# Patient Record
Sex: Female | Born: 1975
Health system: Southern US, Community
[De-identification: ages and names within clinical notes are randomized; demographics above are authoritative.]

## PROBLEM LIST (undated history)

## (undated) DIAGNOSIS — K829 Disease of gallbladder, unspecified: Secondary | ICD-10-CM

## (undated) DIAGNOSIS — M722 Plantar fascial fibromatosis: Secondary | ICD-10-CM

## (undated) DIAGNOSIS — N63 Unspecified lump in unspecified breast: Secondary | ICD-10-CM

## (undated) DIAGNOSIS — Z808 Family history of malignant neoplasm of other organs or systems: Secondary | ICD-10-CM

## (undated) DIAGNOSIS — I10 Essential (primary) hypertension: Secondary | ICD-10-CM

## (undated) DIAGNOSIS — R519 Headache, unspecified: Secondary | ICD-10-CM

## (undated) DIAGNOSIS — M549 Dorsalgia, unspecified: Secondary | ICD-10-CM

## (undated) DIAGNOSIS — Z923 Personal history of irradiation: Secondary | ICD-10-CM

## (undated) DIAGNOSIS — M79673 Pain in unspecified foot: Secondary | ICD-10-CM

## (undated) DIAGNOSIS — M2550 Pain in unspecified joint: Secondary | ICD-10-CM

## (undated) DIAGNOSIS — Z8042 Family history of malignant neoplasm of prostate: Secondary | ICD-10-CM

## (undated) DIAGNOSIS — E669 Obesity, unspecified: Secondary | ICD-10-CM

## (undated) DIAGNOSIS — E559 Vitamin D deficiency, unspecified: Secondary | ICD-10-CM

## (undated) DIAGNOSIS — E538 Deficiency of other specified B group vitamins: Secondary | ICD-10-CM

## (undated) DIAGNOSIS — R51 Headache: Secondary | ICD-10-CM

## (undated) DIAGNOSIS — Z803 Family history of malignant neoplasm of breast: Secondary | ICD-10-CM

## (undated) DIAGNOSIS — D649 Anemia, unspecified: Secondary | ICD-10-CM

## (undated) HISTORY — DX: Dorsalgia, unspecified: M54.9

## (undated) HISTORY — DX: Family history of malignant neoplasm of prostate: Z80.42

## (undated) HISTORY — DX: Plantar fascial fibromatosis: M72.2

## (undated) HISTORY — PX: PILONIDAL CYST EXCISION: SHX744

## (undated) HISTORY — DX: Personal history of irradiation: Z92.3

## (undated) HISTORY — DX: Deficiency of other specified B group vitamins: E53.8

## (undated) HISTORY — DX: Obesity, unspecified: E66.9

## (undated) HISTORY — PX: WISDOM TOOTH EXTRACTION: SHX21

## (undated) HISTORY — DX: Pain in unspecified foot: M79.673

## (undated) HISTORY — DX: Disease of gallbladder, unspecified: K82.9

## (undated) HISTORY — DX: Vitamin D deficiency, unspecified: E55.9

## (undated) HISTORY — DX: Family history of malignant neoplasm of other organs or systems: Z80.8

## (undated) HISTORY — DX: Pain in unspecified joint: M25.50

## (undated) HISTORY — DX: Family history of malignant neoplasm of breast: Z80.3

---

## 1999-07-21 HISTORY — PX: OTHER SURGICAL HISTORY: SHX169

## 1999-11-21 ENCOUNTER — Encounter: Payer: Self-pay | Admitting: Family Medicine

## 1999-11-21 ENCOUNTER — Ambulatory Visit (HOSPITAL_COMMUNITY): Admission: RE | Admit: 1999-11-21 | Discharge: 1999-11-21 | Payer: Self-pay | Admitting: Family Medicine

## 2001-07-07 ENCOUNTER — Encounter: Payer: Self-pay | Admitting: Emergency Medicine

## 2001-07-07 ENCOUNTER — Emergency Department (HOSPITAL_COMMUNITY): Admission: EM | Admit: 2001-07-07 | Discharge: 2001-07-07 | Payer: Self-pay | Admitting: Emergency Medicine

## 2001-07-28 ENCOUNTER — Ambulatory Visit (HOSPITAL_COMMUNITY): Admission: RE | Admit: 2001-07-28 | Discharge: 2001-07-28 | Payer: Self-pay | Admitting: Family Medicine

## 2001-07-28 ENCOUNTER — Encounter: Payer: Self-pay | Admitting: Family Medicine

## 2001-09-22 ENCOUNTER — Encounter: Payer: Self-pay | Admitting: General Surgery

## 2001-09-22 ENCOUNTER — Observation Stay (HOSPITAL_COMMUNITY): Admission: RE | Admit: 2001-09-22 | Discharge: 2001-09-23 | Payer: Self-pay | Admitting: General Surgery

## 2001-09-22 ENCOUNTER — Encounter (INDEPENDENT_AMBULATORY_CARE_PROVIDER_SITE_OTHER): Payer: Self-pay | Admitting: Specialist

## 2002-05-05 ENCOUNTER — Ambulatory Visit (HOSPITAL_COMMUNITY): Admission: RE | Admit: 2002-05-05 | Discharge: 2002-05-05 | Payer: Self-pay | Admitting: Family Medicine

## 2002-05-05 ENCOUNTER — Encounter: Payer: Self-pay | Admitting: Family Medicine

## 2002-10-26 ENCOUNTER — Encounter: Payer: Self-pay | Admitting: Orthopaedic Surgery

## 2002-10-26 ENCOUNTER — Encounter: Admission: RE | Admit: 2002-10-26 | Discharge: 2002-10-26 | Payer: Self-pay | Admitting: Orthopaedic Surgery

## 2003-03-28 ENCOUNTER — Encounter: Admission: RE | Admit: 2003-03-28 | Discharge: 2003-03-28 | Payer: Self-pay | Admitting: Neurosurgery

## 2003-03-28 ENCOUNTER — Encounter: Payer: Self-pay | Admitting: Neurosurgery

## 2003-08-03 ENCOUNTER — Other Ambulatory Visit: Admission: RE | Admit: 2003-08-03 | Discharge: 2003-08-03 | Payer: Self-pay | Admitting: Family Medicine

## 2003-08-15 ENCOUNTER — Encounter: Admission: RE | Admit: 2003-08-15 | Discharge: 2003-08-15 | Payer: Self-pay | Admitting: Neurosurgery

## 2003-08-29 ENCOUNTER — Encounter: Admission: RE | Admit: 2003-08-29 | Discharge: 2003-08-29 | Payer: Self-pay | Admitting: Neurosurgery

## 2004-02-06 ENCOUNTER — Other Ambulatory Visit: Admission: RE | Admit: 2004-02-06 | Discharge: 2004-02-06 | Payer: Self-pay | Admitting: Family Medicine

## 2004-04-08 ENCOUNTER — Encounter: Admission: RE | Admit: 2004-04-08 | Discharge: 2004-04-08 | Payer: Self-pay | Admitting: Neurosurgery

## 2004-04-29 ENCOUNTER — Encounter: Admission: RE | Admit: 2004-04-29 | Discharge: 2004-04-29 | Payer: Self-pay | Admitting: Neurosurgery

## 2004-05-13 ENCOUNTER — Encounter: Admission: RE | Admit: 2004-05-13 | Discharge: 2004-05-13 | Payer: Self-pay | Admitting: Neurosurgery

## 2005-02-03 ENCOUNTER — Other Ambulatory Visit: Admission: RE | Admit: 2005-02-03 | Discharge: 2005-02-03 | Payer: Self-pay | Admitting: Family Medicine

## 2006-03-29 ENCOUNTER — Other Ambulatory Visit: Admission: RE | Admit: 2006-03-29 | Discharge: 2006-03-29 | Payer: Self-pay | Admitting: Family Medicine

## 2007-05-06 ENCOUNTER — Other Ambulatory Visit: Admission: RE | Admit: 2007-05-06 | Discharge: 2007-05-06 | Payer: Self-pay | Admitting: Family Medicine

## 2008-07-24 ENCOUNTER — Other Ambulatory Visit: Admission: RE | Admit: 2008-07-24 | Discharge: 2008-07-24 | Payer: Self-pay | Admitting: Family Medicine

## 2009-08-27 ENCOUNTER — Other Ambulatory Visit: Admission: RE | Admit: 2009-08-27 | Discharge: 2009-08-27 | Payer: Self-pay | Admitting: Family Medicine

## 2010-12-05 NOTE — Op Note (Signed)
[facility]  Patient:    Rebecca Dominguez, Rebecca Dominguez Visit Number: [id number] MRN: [id number]          Service Type: SUR Location: 3W 0340 01 Attending Physician:  Arlis Porta Dictated by:   Adolph Pollack, M.D. Proc. Date: 09/22/01 Admit Date:  09/22/2001   CC:         Rebecca Acres. Cliffton Dominguez, M.D.   Operative Report  PREOPERATIVE DIAGNOSIS:  Symptomatic cholelithiasis.  POSTOPERATIVE DIAGNOSIS:  Symptomatic cholelithiasis.  PROCEDURE:  Laparoscopic cholecystectomy with intraoperative cholangiogram.  SURGEON:  Adolph Pollack, M.D.  ASSISTANT:  Abigail Miyamoto, M.D.  ANESTHESIA:  General.  INDICATIONS FOR PROCEDURE:  Ms. Rebecca Dominguez is a 35 year old female status post laparoscopic Roux-en-Y gastric bypass at [facility]. She has lost 140 pounds over approximately a years time. She had post prandial right upper quadrant pain very classic for biliary colic. She ended up going to the emergency department after recurrent bouts and ultrasound demonstrated a cholelithiasis. Her symptoms were typical of biliary colic and now she presents for elective laparoscopic cholecystectomy. Of note was that on our ultrasound report, the common bile duct was slightly dilated. Her liver function tests preoperatively are normal.  TECHNIQUE:  She was placed supine on the operating table and a general anesthetic was administered. Her abdomen was sterilely prepped and draped. A 0.25% plain Marcaine was infiltrated in the subumbilical region and a small subumbilical scar was made incising the skin and subcutaneous tissue. The midline fascia was identified and a 1 to 1.5 cm incision was made in the midline fascia. A pursestring suture of #0 Vicryl was placed around the fascial edges. A Hasson trocar was introduced into the peritoneal cavity and a pneumoperitoneum created by insufflation of CO2 gas. Next, she was placed in the appropriate position  and a laparoscope was introduced into the abdominal cavity. The viscera underlying the Hasson trocar were examined and no injury noted.  An 11 mm incision was made in the epigastrium and two 5 mm incisions made in the right abdomen. Trocars similar in size to the incisions were then placed into the abdominal cavity. The fundus of the gallbladder was grasped and filmy adhesions between the gallbladder omentum, and duodenum were taken down bluntly. The fundus was then retracted to the right shoulder and the infundibulum was retracted to the right lateral region. Using careful blunt dissection, I was able to isolate the cystic duct. I completely mobilized the infundibulum and created a window around the cystic duct. I placed a clip at the cystic duct gallbladder junction and made an incision into the cystic duct. A small amount of bile was able to be milked out. I passed a cholangiocatheter into the anterior abdominal wall and a cholangiogram was performed.  Under real-time fluoroscopy, dilute contrast material was injected into the cystic duct. I promptly entered the common bile duct which drained promptly into the duodenum. I did not see any evidence of common bile duct obstruction. The right and left hepatic ducts were also visualized.  The final report of the cholangiogram is pending the radiolgists interpretation. The cholangiocath was removed, cystic duct was clipped three times proximally and divided. The cystic artery was identified, clipped and divided. The gallbladder was then dissected free from the liver bed intact with electrocautery. The gallbladder fossa was inspected and irrigated and inspected again and no bleeding and no bile leakage was noted. The gallbladder was then removed through the subumbilical incision. Under direct visualization, the subumbilical fascial defect was closed  by tightening up and tying down the pursestring suture. The irrigation fluid was  evacuated, pneumoperitoneum released and the trocars were removed.  All skin incisions were then closed with 4-0 monocryl subcuticular stitches. Steri-Strips and sterile dressings were applied.  The patient tolerated the procedure well without any apparent complications and was taken to the recovery room in satisfactory condition. Dictated by:   Adolph Pollack, M.D. Attending Physician:  Arlis Porta DD:  09/22/01 TD:  09/22/01 Job: [id number]

## 2011-07-21 HISTORY — PX: CHOLECYSTECTOMY: SHX55

## 2014-12-14 ENCOUNTER — Other Ambulatory Visit (HOSPITAL_COMMUNITY)
Admission: RE | Admit: 2014-12-14 | Discharge: 2014-12-14 | Disposition: A | Discharge status: Home / Self Care | Payer: BLUE CROSS/BLUE SHIELD | Source: Ambulatory Visit | Attending: Family Medicine | Admitting: Family Medicine

## 2014-12-14 DIAGNOSIS — Z1151 Encounter for screening for human papillomavirus (HPV): Secondary | ICD-10-CM | POA: Diagnosis present

## 2014-12-14 DIAGNOSIS — Z01419 Encounter for gynecological examination (general) (routine) without abnormal findings: Secondary | ICD-10-CM | POA: Insufficient documentation

## 2016-03-02 ENCOUNTER — Other Ambulatory Visit: Payer: Self-pay | Admitting: Gastroenterology

## 2016-03-05 ENCOUNTER — Encounter (HOSPITAL_COMMUNITY): Payer: Self-pay | Admitting: *Deleted

## 2016-03-05 NOTE — Progress Notes (Signed)
Spoke with pt for pre-procedure call. Pt denies cardiac history, chest pain or sob.

## 2016-03-06 ENCOUNTER — Encounter (HOSPITAL_COMMUNITY): Payer: Self-pay | Admitting: *Deleted

## 2016-03-06 ENCOUNTER — Encounter (HOSPITAL_COMMUNITY)
Admission: RE | Disposition: A | Discharge status: Home / Self Care | Payer: Self-pay | Source: Ambulatory Visit | Attending: Gastroenterology

## 2016-03-06 ENCOUNTER — Ambulatory Visit (HOSPITAL_COMMUNITY): Payer: Managed Care, Other (non HMO) | Admitting: Anesthesiology

## 2016-03-06 ENCOUNTER — Ambulatory Visit (HOSPITAL_COMMUNITY)
Admission: RE | Admit: 2016-03-06 | Discharge: 2016-03-06 | Disposition: A | Discharge status: Home / Self Care | Payer: Managed Care, Other (non HMO) | Source: Ambulatory Visit | Attending: Gastroenterology | Admitting: Gastroenterology

## 2016-03-06 DIAGNOSIS — I10 Essential (primary) hypertension: Secondary | ICD-10-CM | POA: Insufficient documentation

## 2016-03-06 DIAGNOSIS — Z9884 Bariatric surgery status: Secondary | ICD-10-CM | POA: Insufficient documentation

## 2016-03-06 DIAGNOSIS — Z1211 Encounter for screening for malignant neoplasm of colon: Secondary | ICD-10-CM | POA: Diagnosis present

## 2016-03-06 DIAGNOSIS — Z800 Family history of malignant neoplasm of digestive organs: Secondary | ICD-10-CM | POA: Diagnosis not present

## 2016-03-06 HISTORY — PX: COLONOSCOPY WITH PROPOFOL: SHX5780

## 2016-03-06 HISTORY — DX: Headache: R51

## 2016-03-06 HISTORY — DX: Essential (primary) hypertension: I10

## 2016-03-06 HISTORY — DX: Headache, unspecified: R51.9

## 2016-03-06 HISTORY — DX: Anemia, unspecified: D64.9

## 2016-03-06 SURGERY — COLONOSCOPY WITH PROPOFOL
Anesthesia: Monitor Anesthesia Care

## 2016-03-06 MED ORDER — PROPOFOL 500 MG/50ML IV EMUL
INTRAVENOUS | Status: DC | PRN
  Administered 2016-03-06: 75 ug/kg/min via INTRAVENOUS

## 2016-03-06 MED ORDER — PROPOFOL 10 MG/ML IV BOLUS
INTRAVENOUS | Status: DC | PRN
  Administered 2016-03-06 (×3): 20 mg via INTRAVENOUS
  Administered 2016-03-06: 30 mg via INTRAVENOUS
  Administered 2016-03-06 (×2): 20 mg via INTRAVENOUS
  Administered 2016-03-06: 10 mg via INTRAVENOUS

## 2016-03-06 MED ORDER — SODIUM CHLORIDE 0.9 % IV SOLN: INTRAVENOUS | Status: DC

## 2016-03-06 MED ORDER — LACTATED RINGERS IV SOLN
INTRAVENOUS | Status: DC
  Administered 2016-03-06: 1000 mL via INTRAVENOUS

## 2016-03-06 NOTE — Transfer of Care (Signed)
Immediate Anesthesia Transfer of Care Note  Patient: Rebecca Dominguez  Procedure(s) Performed: Procedure(s): COLONOSCOPY WITH PROPOFOL (N/A)  Patient Location: Endoscopy Unit  Anesthesia Type:MAC  Level of Consciousness: awake, alert  and oriented  Airway & Oxygen Therapy: Patient Spontanous Breathing  Post-op Assessment: Report given to RN, Post -op Vital signs reviewed and stable and Patient moving all extremities  Post vital signs: Reviewed and stable  Last Vitals:  Vitals:   03/06/16 1049 03/06/16 1052  BP:  132/68  Resp: (!) 9   Temp: 37.1 C     Last Pain:  Vitals:   03/06/16 1049  TempSrc: Oral         Complications: No apparent anesthesia complications

## 2016-03-06 NOTE — H&P (Signed)
[facility] H/P Note  Chief Complaint: colon cancer screening  HPI: Rebecca Dominguez is an 40 y.o. female.  Presenting for screening colonoscopy.  FHx colon cancer (father).  She has no abdominal pain, change in bowel habits, blood in stool, loss-of-appetite, unintentional weight loss.  NO prior colonoscopy.  Past Medical History:  Diagnosis Date  . Anemia    low iron  . Headache    occasional  . Hypertension    no longer on medications since weight loss    Past Surgical History:  Procedure Laterality Date  . CHOLECYSTECTOMY  2013  . gastric bypass surgery  2001  . PILONIDAL CYST EXCISION    . WISDOM TOOTH EXTRACTION      Medications Prior to Admission  Medication Sig Dispense Refill  . cholecalciferol (VITAMIN D) 1000 units tablet Take 1,000 Units by mouth daily.    . cyanocobalamin 500 MCG tablet Take 500 mcg by mouth daily.    . ferrous sulfate 325 (65 FE) MG tablet Take 325 mg by mouth daily with breakfast.    . levonorgestrel (MIRENA) 20 MCG/24HR IUD 1 each by Intrauterine route once.      Allergies: No Known Allergies  Family History  Problem Relation Age of Onset  . Breast cancer Mother   . Colon cancer Father   . Diabetes Father     Social History:  reports that she has never smoked. She has never used smokeless tobacco. She reports that she drinks alcohol. She reports that she does not use drugs.   ROS: As per HPI   Blood pressure 132/68, temperature 98.7 F (37.1 C), temperature source Oral, resp. rate (!) 9, height 5' 5.5" (1.664 m), weight (!) 154.2 kg (340 lb), SpO2 100 %. Gen:  NAD, overweight, obese ABD:  Soft, protuberant  No results found for this or any previous visit (from the past 48 hour(s)). No results found.  Assessment/Plan  1.  Colon cancer screening, high risk (first-degree relative with CRC). 2.  Plan for colonoscopy. 3.  Risks (bleeding, infection, bowel perforation that could require surgery, sedation-related changes  in cardiopulmonary systems), benefits (identification and possible treatment of source of symptoms, exclusion of certain causes of symptoms), and alternatives (watchful waiting, radiographic imaging studies, empiric medical treatment) of colonoscopy were explained to patient/family in detail and patient wishes to proceed.  Freddy JakschOUTLAW,Ethelbert Thain M 03/06/2016, 11:35 AM

## 2016-03-06 NOTE — Op Note (Signed)
[facility] Patient Name: Rebecca Dominguez Procedure Date : 03/06/2016 MRN: [id number] Attending MD: Willis ModenaWilliam Emogene Muratalla , MD Date of Birth: [birth year] CSN: [id number] Age: 40 Admit Type: Outpatient Procedure:                Colonoscopy Indications:              Screening in patient at increased risk: Family                            history of 1st-degree relative with colorectal                            cancer, This is the patient's first colonoscopy Providers:                Willis ModenaWilliam Reema Chick, MD, Tomma RakersJennifer Kappus, RN, Kandice RobinsonsGuillaume                            [place], Technician Referring MD:             Laurann Montanaynthia White, MD Medicines:                Monitored Anesthesia Care Complications:            No immediate complications. Estimated Blood Loss:     Estimated blood loss: none. Procedure:                Pre-Anesthesia Assessment:                           - Prior to the procedure, a History and Physical                            was performed, and patient medications and                            allergies were reviewed. The patient's tolerance of                            previous anesthesia was also reviewed. The risks                            and benefits of the procedure and the sedation                            options and risks were discussed with the patient.                            All questions were answered, and informed consent                            was obtained. Prior Anticoagulants: The patient has                            taken previous NSAID medication. ASA Grade  Assessment: II - A patient with mild systemic                            disease. After reviewing the risks and benefits,                            the patient was deemed in satisfactory condition to                            undergo the procedure.                           After obtaining informed consent, the colonoscope                            was passed  under direct vision. Throughout the                            procedure, the patient's blood pressure, pulse, and                            oxygen saturations were monitored continuously. The                            EC-[id number] ([id number] scope was introduced through                            the anus and advanced to the the cecum, identified                            by appendiceal orifice and ileocecal valve. The                            ileocecal valve, appendiceal orifice, and rectum                            were photographed. The entire colon was examined.                            The colonoscopy was performed without difficulty.                            The patient tolerated the procedure well. The                            quality of the bowel preparation was good. Scope In: 11:48:14 AM Scope Out: 12:02:02 PM Total Procedure Duration: 0 hours 13 minutes 48 seconds  Findings:      The perianal and digital rectal examinations were normal.      The entire examined colon appeared normal on direct and retroflexion       views.      The retroflexed view of the distal rectum and anal verge was normal and       showed no anal or rectal abnormalities. Impression:               -  The entire examined colon is normal on direct and                            retroflexion views.                           - The distal rectum and anal verge are normal on                            retroflexion view. Moderate Sedation:      None Recommendation:           - Discharge patient to home (via wheelchair).                           - Resume previous diet today.                           - Continue present medications.                           - Repeat colonoscopy in 5 years for screening                            purposes.                           - Return to GI clinic PRN.                           - Return to referring physician as previously                             scheduled. Procedure Code(s):        --- Professional ---                           [phone], Colonoscopy, flexible; diagnostic, including                            collection of specimen(s) by brushing or washing,                            when performed (separate procedure) Diagnosis Code(s):        --- Professional ---                           Z80.0, Family history of malignant neoplasm of                            digestive organs CPT copyright 2016 [employer]. All rights reserved. The codes documented in this report are preliminary and upon coder review may  be revised to meet current compliance requirements. Willis Modena, MD 03/06/2016 12:14:42 PM This report has been signed electronically. Number of Addenda: 0

## 2016-03-06 NOTE — Discharge Instructions (Signed)

## 2016-03-06 NOTE — Anesthesia Preprocedure Evaluation (Signed)
Anesthesia Evaluation  Patient identified by MRN, date of birth, ID band Patient awake    Reviewed: Allergy & Precautions, H&P , NPO status , Patient's Chart, lab work & pertinent test results  History of Anesthesia Complications Negative for: history of anesthetic complications  Airway Mallampati: II  TM Distance: >3 FB Neck ROM: full    Dental no notable dental hx.    Pulmonary neg pulmonary ROS,    Pulmonary exam normal breath sounds clear to auscultation       Cardiovascular hypertension, Normal cardiovascular exam Rhythm:regular Rate:Normal     Neuro/Psych  Headaches,    GI/Hepatic negative GI ROS, Neg liver ROS,   Endo/Other  negative endocrine ROS  Renal/GU negative Renal ROS     Musculoskeletal   Abdominal   Peds  Hematology  (+) anemia ,   Anesthesia Other Findings   Reproductive/Obstetrics negative OB ROS                             Anesthesia Physical Anesthesia Plan  ASA: II  Anesthesia Plan: MAC   Post-op Pain Management:    Induction: Intravenous  Airway Management Planned: Simple Face Mask  Additional Equipment:   Intra-op Plan:   Post-operative Plan:   Informed Consent: I have reviewed the patients History and Physical, chart, labs and discussed the procedure including the risks, benefits and alternatives for the proposed anesthesia with the patient or authorized representative who has indicated his/her understanding and acceptance.   Dental Advisory Given  Plan Discussed with: Anesthesiologist, CRNA and Surgeon  Anesthesia Plan Comments:         Anesthesia Quick Evaluation

## 2016-03-06 NOTE — Anesthesia Postprocedure Evaluation (Signed)
Anesthesia Post Note  Patient: Rebecca Dominguez  Procedure(s) Performed: Procedure(s) (LRB): COLONOSCOPY WITH PROPOFOL (N/A)  Patient location during evaluation: PACU Anesthesia Type: MAC Level of consciousness: awake and alert Pain management: pain level controlled Vital Signs Assessment: post-procedure vital signs reviewed and stable Respiratory status: spontaneous breathing, nonlabored ventilation, respiratory function stable and patient connected to nasal cannula oxygen Cardiovascular status: stable and blood pressure returned to baseline Anesthetic complications: no    Last Vitals:  Vitals:   03/06/16 1220 03/06/16 1230  BP: 135/90 (!) 143/96  Pulse: 74 77  Resp: (!) 21 20  Temp:      Last Pain:  Vitals:   03/06/16 1049  TempSrc: Oral                 Reino KentJudd, Starkisha Tullis J

## 2016-03-08 ENCOUNTER — Encounter (HOSPITAL_COMMUNITY): Payer: Self-pay | Admitting: Gastroenterology

## 2018-05-26 ENCOUNTER — Other Ambulatory Visit: Payer: Self-pay | Admitting: Family Medicine

## 2018-05-26 DIAGNOSIS — Z1231 Encounter for screening mammogram for malignant neoplasm of breast: Secondary | ICD-10-CM

## 2018-07-07 ENCOUNTER — Ambulatory Visit
Admission: RE | Admit: 2018-07-07 | Discharge: 2018-07-07 | Disposition: A | Discharge status: Home / Self Care | Payer: BLUE CROSS/BLUE SHIELD | Source: Ambulatory Visit | Attending: Family Medicine | Admitting: Family Medicine

## 2018-07-07 DIAGNOSIS — Z1231 Encounter for screening mammogram for malignant neoplasm of breast: Secondary | ICD-10-CM

## 2018-09-08 ENCOUNTER — Encounter (INDEPENDENT_AMBULATORY_CARE_PROVIDER_SITE_OTHER): Payer: Self-pay

## 2018-09-12 ENCOUNTER — Encounter (INDEPENDENT_AMBULATORY_CARE_PROVIDER_SITE_OTHER): Payer: Self-pay | Admitting: Family Medicine

## 2018-09-12 ENCOUNTER — Ambulatory Visit (INDEPENDENT_AMBULATORY_CARE_PROVIDER_SITE_OTHER): Payer: BLUE CROSS/BLUE SHIELD | Admitting: Family Medicine

## 2018-09-12 VITALS — BP 159/75 | HR 92 | Ht 65.0 in | Wt 338.0 lb

## 2018-09-12 DIAGNOSIS — R5383 Other fatigue: Secondary | ICD-10-CM | POA: Diagnosis not present

## 2018-09-12 DIAGNOSIS — F3289 Other specified depressive episodes: Secondary | ICD-10-CM | POA: Diagnosis not present

## 2018-09-12 DIAGNOSIS — Z9189 Other specified personal risk factors, not elsewhere classified: Secondary | ICD-10-CM | POA: Diagnosis not present

## 2018-09-12 DIAGNOSIS — R0602 Shortness of breath: Secondary | ICD-10-CM | POA: Diagnosis not present

## 2018-09-12 DIAGNOSIS — E559 Vitamin D deficiency, unspecified: Secondary | ICD-10-CM

## 2018-09-12 DIAGNOSIS — E538 Deficiency of other specified B group vitamins: Secondary | ICD-10-CM

## 2018-09-12 DIAGNOSIS — Z0289 Encounter for other administrative examinations: Secondary | ICD-10-CM

## 2018-09-12 DIAGNOSIS — Z6841 Body Mass Index (BMI) 40.0 and over, adult: Secondary | ICD-10-CM

## 2018-09-12 DIAGNOSIS — I10 Essential (primary) hypertension: Secondary | ICD-10-CM

## 2018-09-12 NOTE — Progress Notes (Signed)
Office: [phone]  /  Fax: [phone]   Dear Dr. Laurann Montanaynthia White,    Thank you for referring Rebecca Dominguez to our clinic. The following note includes my evaluation and treatment recommendations.  HPI:   Chief Complaint: OBESITY    Rebecca Dominguez has been referred by Dr. Laurann Montanaynthia White for consultation regarding her obesity and obesity related comorbidities.    Rebecca Dominguez (MR# [id number]) is a 43 y.o. female who presents on 09/12/2018 for obesity evaluation and treatment. Current BMI is Body mass index is 56.25 kg/m.  Rebecca Dominguez has been struggling with her weight for many years and has been unsuccessful in either losing weight, maintaining weight loss, or reaching her healthy weight goal.     Rebecca Dominguez attended our information session and states she is currently in the action stage of change and ready to dedicate time achieving and maintaining a healthier weight. Rebecca Dominguez is interested in becoming our patient and working on intensive lifestyle modifications including (but not limited to) diet, exercise and weight loss.    Rebecca Dominguez states her family eats meals together her desired weight loss is 158 lbs she has been heavy most of her life she started gaining weight since puberty her heaviest weight ever was 358 lbs. she has significant food cravings issues  she snacks frequently in the evenings she is frequently drinking liquids with calories she frequently makes poor food choices she frequently eats larger portions than normal  she sometimes has binge eating behaviors she struggles with emotional eating   Rebecca Dominguez is status post Roux-En-Y gastric bypass surgery. Her weight went from 358 to 204 pounds in 1 year. She started regaining after 2 years and has gained most of her weight back since then. She denies dumping or restriction.  Fatigue Rebecca Dominguez feels her energy is lower than it should be. This has worsened with weight gain and has not worsened recently.  Rebecca Dominguez denies daytime somnolence and admits to waking up still tired. Patient is at risk for obstructive sleep apnea. Patent has a history of symptoms of hypertension. Patient generally gets 6 to 8 hours of sleep per night, and states they generally have generally restful sleep. Snoring is not present. Apneic episodes are not present. Epworth Sleepiness Score is 5.  Dyspnea on exertion Rebecca Dominguez notes increasing shortness of breath with exercising and seems to be worsening over time with weight gain. She notes getting out of breath sooner with activity than she used to. This has not gotten worse recently. Rebecca Dominguez denies orthopnea.  Hypertension Rebecca Dominguez is a 43 y.o. female with hypertension. Ketzaly's blood pressure is elevated today. She has a history of hypertension, but states not any longer. She is working on weight loss to help control her blood pressure with the goal of decreasing her risk of heart attack and stroke. Rebecca Dominguez denies chest pain or headache.  At risk for cardiovascular disease Rebecca Dominguez is at a higher than average risk for cardiovascular disease due to hypertension and obesity. She currently denies any chest pain.  Vitamin D deficiency Rebecca Dominguez has a diagnosis of vitamin D deficiency. She is currently taking vit D and does not have recent labs. She admits fatigue and denies nausea, vomiting, or muscle weakness.  Vitamin B12 Deficiency Rebecca Dominguez has a diagnosis of B12 insufficiency and notes fatigue. This is not a new diagnosis and she is on OTC B12 with no recent labs. Rebecca Dominguez is not a vegetarian and does have a previous diagnosis of pernicious anemia. She has a history of  weight loss surgery.   Depression with emotional eating behaviors Rebecca Dominguez is not on an antidepressant, but cried repeatedly in the visit discussing her weight struggles and feelings of failure with regaining weight after gastric bypass surgery. She feels her weight causes her life to  not be worth living at least 50% of the time. She is struggling with emotional eating and using food for comfort to the extent that it is negatively impacting her health. She often snacks when she is not hungry. Ferol sometimes feels she is out of control and then feels guilty that she made poor food choices. She shows no sign of suicidal or homicidal ideations.  Depression Screen Rebecca Dominguez's Food and Mood (modified PHQ-9) score was 14. Depression screen PHQ 2/9 09/12/2018  Decreased Interest 1  Down, Depressed, Hopeless 2  PHQ - 2 Score 3  Altered sleeping 1  Tired, decreased energy 2  Change in appetite 2  Feeling bad or failure about yourself  3  Trouble concentrating 0  Moving slowly or fidgety/restless 1  Suicidal thoughts 2  PHQ-9 Score 14  Difficult doing work/chores Not difficult at all   ASSESSMENT AND PLAN:  Other fatigue - Plan: EKG 12-Lead, CBC With Differential, Hemoglobin A1c, Insulin, random, Lipid Panel With LDL/HDL Ratio, T3, T4, free, TSH, Anemia panel, Magnesium, PTH, Intact and Calcium  Shortness of breath on exertion  Essential hypertension - Plan: Comprehensive metabolic panel  Vitamin D deficiency - Plan: VITAMIN D 25 Hydroxy (Vit-D Deficiency, Fractures)  B12 nutritional deficiency - Plan: Vitamin B12, Folate, Anemia panel  Other depression - with emotional eating  At risk for heart disease  Class 3 severe obesity with serious comorbidity and body mass index (BMI) of 50.0 to 59.9 in adult, unspecified obesity type (HCC)  PLAN:  Fatigue Rebecca Dominguez was informed that her fatigue may be related to obesity, depression or many other causes. Labs will be ordered, and in the meanwhile Rebecca Dominguez has agreed to work on diet, exercise and weight loss to help with fatigue. Proper sleep hygiene was discussed including the need for 7-8 hours of quality sleep each night. A sleep study was not ordered based on symptoms and Epworth score. An EKG and an indirect  calorimetry was ordered today. Keyuana agrees to follow up in 2 weeks.  Dyspnea on exertion Rebecca Dominguez shortness of breath appears to be obesity related and exercise induced.She has agreed to work on weight loss and gradually increase exercise to treat her exercise induced shortness of breath. If Rebecca Dominguez follows our instructions and loses weight without improvement of her shortness of breath, we will plan to refer to pulmonology. We ordered labs, an EKG, and an indirect calorimetry today. We will monitor this condition regularly. Rebecca Dominguez agrees to this plan.  Hypertension We discussed sodium restriction, working on healthy weight loss, and a regular exercise program as the means to achieve improved blood pressure control. We will continue to monitor her blood pressure as well as her progress with the above lifestyle modifications. She will start her diet prescription and weight loss. She will watch for signs of hypotension as she continues her lifestyle modifications. We will recheck her blood pressure in 2 months. Rebecca Dominguez agreed with this plan and agreed to follow up as directed.  Cardiovascular risk counseling Rebecca Dominguez was given extended (15 minutes) coronary artery disease prevention counseling today. She is 43 y.o. female and has risk factors for heart disease including hypertension and obesity. We discussed intensive lifestyle modifications today with an emphasis on specific weight loss  instructions and strategies. Pt was also informed of the importance of increasing exercise and decreasing saturated fats to help prevent heart disease.  Vitamin D Deficiency Rebecca Dominguez was informed that low vitamin D levels contributes to fatigue and are associated with obesity, breast, and colon cancer. We will check labs today and she agrees to follow up as directed.  Vitamin B12 Deficiency Rebecca Dominguez will work on increasing B12 rich foods in her diet. B12 supplementation was not prescribed today. We  will plan on checking labs today and she will follow up at the agreed upon time.  Depression with Emotional Eating Behaviors We discussed behavior modification techniques today to help Rebecca Dominguez deal with her emotional eating and depression. Patient was referred to Dr. Dewaine Conger, our bariatric psychologist for evaluation in 2 weeks due to elevated PHQ-9 score and significant struggles with emotional eating. She will follow up as directed.  Depression Screen Rebecca Dominguez had a moderately positive depression screening. Depression is commonly associated with obesity and often results in emotional eating behaviors. We will monitor this closely and work on CBT to help improve the non-hunger eating patterns. Referral to Psychology may be required if no improvement is seen as she continues in our clinic.  Obesity Rebecca Dominguez is currently in the action stage of change and her goal is to continue with weight loss efforts. I recommend Brinna begin the structured treatment plan as follows:  She has agreed to follow the Category 3 plan and she was given Category 4 microwave meals. Sharlena has been instructed to eventually work up to a goal of 150 minutes of combined cardio and strengthening exercise per week for weight loss and overall health benefits. We discussed the following Behavioral Modification Strategies today: increasing lean protein intake and decreasing simple carbohydrates.    She was informed of the importance of frequent follow up visits to maximize her success with intensive lifestyle modifications for her multiple health conditions. She was informed we would discuss her lab results at her next visit unless there is a critical issue that needs to be addressed sooner. Faige agreed to keep her next visit at the agreed upon time to discuss these results.  ALLERGIES: No Known Allergies  MEDICATIONS: Current Outpatient Medications on File Prior to Visit  Medication Sig Dispense Refill  .  aspirin-acetaminophen-caffeine (EXCEDRIN MIGRAINE) 250-250-65 MG tablet Take 1 tablet by mouth every 6 (six) hours as needed for headache.    . cholecalciferol (VITAMIN D) 1000 units tablet Take 1,000 Units by mouth daily.    . cyanocobalamin 500 MCG tablet Take 500 mcg by mouth daily.    . ferrous sulfate 325 (65 FE) MG tablet Take 325 mg by mouth daily with breakfast.    . Glucosamine-Chondroit-Vit C-Mn (GLUCOSAMINE CHONDR 1500 COMPLX) CAPS Take 1 capsule by mouth daily.    Marland Kitchen levonorgestrel (MIRENA) 20 MCG/24HR IUD 1 each by Intrauterine route once.    . Multiple Vitamins-Minerals (MULTIVITAMIN WOMEN) TABS Take 1 tablet by mouth daily.    . naproxen (NAPROSYN) 500 MG tablet Take 500 mg by mouth 2 (two) times daily with a meal.    . vitamin C (ASCORBIC ACID) 500 MG tablet Take 500 mg by mouth daily.     No current facility-administered medications on file prior to visit.     PAST MEDICAL HISTORY: Past Medical History:  Diagnosis Date  . Anemia    low iron  . Back pain   . Foot pain    from previous fracture of toe 2010  . Gallbladder  problem    removed  . Headache    occasional  . Hypertension    no longer on medications since weight loss  . Joint pain   . Obesity   . Plantar fasciitis   . Vitamin B 12 deficiency   . Vitamin D deficiency     PAST SURGICAL HISTORY: Past Surgical History:  Procedure Laterality Date  . CHOLECYSTECTOMY  2013  . COLONOSCOPY WITH PROPOFOL N/A 03/06/2016   Procedure: COLONOSCOPY WITH PROPOFOL;  Surgeon: Willis Modena, MD;  Location: [facility] ENDOSCOPY;  Service: Endoscopy;  Laterality: N/A;  . gastric bypass surgery  2001  . PILONIDAL CYST EXCISION    . WISDOM TOOTH EXTRACTION      SOCIAL HISTORY: Social History   Tobacco Use  . Smoking status: Never Smoker  . Smokeless tobacco: Never Used  Substance Use Topics  . Alcohol use: Yes    Comment: rare/special occasion  . Drug use: No    FAMILY HISTORY: Family History  Problem Relation Age  of Onset  . Breast cancer Mother   . Obesity Mother   . Colon cancer Father   . Diabetes Father   . Obesity Father     ROS: Review of Systems  Constitutional: Positive for malaise/fatigue. Negative for weight loss.  Eyes:       Wears glasses or contacts.  Respiratory: Positive for shortness of breath ( with activity).   Cardiovascular: Negative for chest pain and orthopnea.  Gastrointestinal: Negative for nausea and vomiting.  Musculoskeletal: Positive for back pain and joint pain.       Negative for muscle weakness.  Neurological: Negative for headaches.  Psychiatric/Behavioral: Positive for depression. Negative for suicidal ideas.       Negative for homicidal ideations.    PHYSICAL EXAM: Blood pressure (!) 159/75, pulse 92, height 5\' 5"  (1.651 m), weight (!) 338 lb (153.3 kg), SpO2 98 %. Body mass index is 56.25 kg/m. Physical Exam Vitals signs reviewed.  Constitutional:      Appearance: Normal appearance. She is obese.  HENT:     Head: Normocephalic and atraumatic.     Nose: Nose normal.  Eyes:     General: No scleral icterus.    Extraocular Movements: Extraocular movements intact.  Neck:     Musculoskeletal: Normal range of motion and neck supple.     Thyroid: No thyromegaly.     Comments: Negative for thyromegaly. Cardiovascular:     Rate and Rhythm: Normal rate and regular rhythm.  Pulmonary:     Effort: Pulmonary effort is normal. No respiratory distress.  Abdominal:     Palpations: Abdomen is soft.     Tenderness: There is no abdominal tenderness.     Comments: Positive for obesity.  Musculoskeletal:     Comments: ROM normal in all extremities.  Skin:    General: Skin is warm and dry.  Neurological:     Mental Status: She is alert and oriented to person, place, and time.     Coordination: Coordination normal.  Psychiatric:        Mood and Affect: Mood normal.        Behavior: Behavior normal.     RECENT LABS AND TESTS: BMET No results found  for: NA, K, CL, CO2, GLUCOSE, BUN, CREATININE, CALCIUM, GFRNONAA, GFRAA No results found for: HGBA1C No results found for: INSULIN CBC No results found for: WBC, RBC, HGB, HCT, PLT, MCV, MCH, MCHC, RDW, LYMPHSABS, MONOABS, EOSABS, BASOSABS Iron/TIBC/Ferritin/ %Sat No results found for: IRON, TIBC,  FERRITIN, IRONPCTSAT Lipid Panel  No results found for: CHOL, TRIG, HDL, CHOLHDL, VLDL, LDLCALC, LDLDIRECT Hepatic Function Panel  No results found for: PROT, ALBUMIN, AST, ALT, ALKPHOS, BILITOT, BILIDIR, IBILI No results found for: TSH  ECG  shows NSR with a rate of 83 BPM. INDIRECT CALORIMETER done today shows a VO2 of 397 and a REE of 2764.  Her calculated basal metabolic rate is 1610 thus her basal metabolic rate is better than expected.  OBESITY BEHAVIORAL INTERVENTION VISIT  Today's visit was # 1   Starting weight: 338 lbs Starting date: 09/12/2018 Today's weight : Weight: (!) 338 lb (153.3 kg)  Today's date: 09/12/2018 Total lbs lost to date: 0   09/12/2018  Height  (1.651 m)  Weight 338 lb (153.3 kg) (A)  BMI (Calculated) 56.25  BLOOD PRESSURE - SYSTOLIC 159  BLOOD PRESSURE - DIASTOLIC 75  Waist Measurement  55 inches   Body Fat % 59.3 %  RMR 2764   ASK: We discussed the diagnosis of obesity with Rebecca Pat today and Rebecca Leatherwood agreed to give [place] permission to discuss obesity behavioral modification therapy today.  ASSESS: Delesa has the diagnosis of obesity and her BMI today is 56.25. Rever is in the action stage of change.   ADVISE: Solae was educated on the multiple health risks of obesity as well as the benefit of weight loss to improve her health. She was advised of the need for long term treatment and the importance of lifestyle modifications to improve her current health and to decrease her risk of future health problems.  AGREE: Multiple dietary modification options and treatment options were discussed and Ilma agreed to follow the  recommendations documented in the above note.  ARRANGE: Shaquila was educated on the importance of frequent visits to treat obesity as outlined per CMS and USPSTF guidelines and agreed to schedule her next follow up appointment today.  IKirke Corin, CMA, am acting as transcriptionist for Wilder Glade, MD   I have reviewed the above documentation for accuracy and completeness, and I agree with the above. -Quillian Quince, MD

## 2018-09-13 LAB — LIPID PANEL WITH LDL/HDL RATIO
Cholesterol, Total: 152 mg/dL (ref 100–199)
HDL: 63 mg/dL (ref >39)
LDL CALC: 79 mg/dL (ref 0–99)
LDL/HDL RATIO: 1.3 ratio (ref 0.0–3.2)
Triglycerides: 48 mg/dL (ref 0–149)
VLDL Cholesterol Cal: 10 mg/dL (ref 5–40)

## 2018-09-13 LAB — CBC WITH DIFFERENTIAL
Basophils Absolute: 0 10*3/uL (ref 0.0–0.2)
Basos: 0 %
EOS (ABSOLUTE): 0.2 10*3/uL (ref 0.0–0.4)
Eos: 3 %
Hemoglobin: 12.6 g/dL (ref 11.1–15.9)
Immature Grans (Abs): 0 10*3/uL (ref 0.0–0.1)
Immature Granulocytes: 0 %
LYMPHS ABS: 1.7 10*3/uL (ref 0.7–3.1)
Lymphs: 24 %
MCH: 26.8 pg (ref 26.6–33.0)
MCHC: 32.7 g/dL (ref 31.5–35.7)
MCV: 82 fL (ref 79–97)
MONOS ABS: 0.4 10*3/uL (ref 0.1–0.9)
Monocytes: 6 %
NEUTROS ABS: 4.4 10*3/uL (ref 1.4–7.0)
NEUTROS PCT: 67 %
RBC: 4.71 x10E6/uL (ref 3.77–5.28)
RDW: 15.3 % (ref 11.7–15.4)
WBC: 6.8 10*3/uL (ref 3.4–10.8)

## 2018-09-13 LAB — COMPREHENSIVE METABOLIC PANEL
ALT: 14 IU/L (ref 0–32)
AST: 13 IU/L (ref 0–40)
Albumin/Globulin Ratio: 1.8 (ref 1.2–2.2)
Albumin: 4.4 g/dL (ref 3.8–4.8)
Alkaline Phosphatase: 92 IU/L (ref 39–117)
BUN/Creatinine Ratio: 23 (ref 9–23)
BUN: 16 mg/dL (ref 6–24)
Bilirubin Total: 0.3 mg/dL (ref 0.0–1.2)
CO2: 21 mmol/L (ref 20–29)
Calcium: 9.8 mg/dL (ref 8.7–10.2)
Chloride: 100 mmol/L (ref 96–106)
Creatinine, Ser: 0.71 mg/dL (ref 0.57–1.00)
GFR calc Af Amer: 121 mL/min/{1.73_m2} (ref >59)
GFR calc non Af Amer: 105 mL/min/{1.73_m2} (ref >59)
Globulin, Total: 2.5 g/dL (ref 1.5–4.5)
Glucose: 79 mg/dL (ref 65–99)
Potassium: 4.2 mmol/L (ref 3.5–5.2)
SODIUM: 136 mmol/L (ref 134–144)
Total Protein: 6.9 g/dL (ref 6.0–8.5)

## 2018-09-13 LAB — ANEMIA PANEL
Ferritin: 17 ng/mL (ref 15–150)
Folate, Hemolysate: 420 ng/mL
Folate, RBC: 1091 ng/mL (ref >498)
HEMATOCRIT: 38.5 % (ref 34.0–46.6)
IRON: 47 ug/dL (ref 27–159)
Iron Saturation: 12 % — ABNORMAL LOW (ref 15–55)
Retic Ct Pct: 1.5 % (ref 0.6–2.6)
Total Iron Binding Capacity: 405 ug/dL (ref 250–450)
UIBC: 358 ug/dL (ref 131–425)
Vitamin B-12: 1591 pg/mL — ABNORMAL HIGH (ref 232–1245)

## 2018-09-13 LAB — VITAMIN D 25 HYDROXY (VIT D DEFICIENCY, FRACTURES): Vit D, 25-Hydroxy: 67.3 ng/mL (ref 30.0–100.0)

## 2018-09-13 LAB — T4, FREE: Free T4: 1.26 ng/dL (ref 0.82–1.77)

## 2018-09-13 LAB — PTH, INTACT AND CALCIUM: PTH: 22 pg/mL (ref 15–65)

## 2018-09-13 LAB — T3: T3, Total: 152 ng/dL (ref 71–180)

## 2018-09-13 LAB — HEMOGLOBIN A1C
Est. average glucose Bld gHb Est-mCnc: 105 mg/dL
Hgb A1c MFr Bld: 5.3 % (ref 4.8–5.6)

## 2018-09-13 LAB — TSH: TSH: 4.35 u[IU]/mL (ref 0.450–4.500)

## 2018-09-13 LAB — MAGNESIUM: Magnesium: 2 mg/dL (ref 1.6–2.3)

## 2018-09-13 LAB — FOLATE: Folate: 14.1 ng/mL (ref >3.0)

## 2018-09-13 LAB — INSULIN, RANDOM: INSULIN: 8.8 u[IU]/mL (ref 2.6–24.9)

## 2018-09-22 ENCOUNTER — Ambulatory Visit (INDEPENDENT_AMBULATORY_CARE_PROVIDER_SITE_OTHER): Payer: BLUE CROSS/BLUE SHIELD | Admitting: Psychology

## 2018-09-22 DIAGNOSIS — F3289 Other specified depressive episodes: Secondary | ICD-10-CM

## 2018-09-22 NOTE — Progress Notes (Signed)
Office: [phone]  /  Fax: [phone]    Date: September 22, 2018 Time Seen: 10:05am Duration: 55 minutes Provider: Glennie Isle, PsyD Type of Session: Intake for Individual Therapy  Type of Contact: Face-to-face  Informed Consent:The provider's role was explained to Murrell Redden. The provider reviewed and discussed issues of confidentiality, privacy, and limits therein. In addition to verbal informed consent, written informed consent for psychological services was obtained from [place] prior to the initial intake interview. Written consent included information concerning the practice, financial arrangements, and confidentiality and patients' rights. Since the clinic is not a 24/7 crisis center, mental health emergency resources were shared in the form of a handout, and the provider explained MyChart, e-mail, voicemail, and/or other messaging systems should be utilized only for non-emergency reasons. Kaedance verbally acknowledged understanding of the aforementioned, and agreed to use mental health emergency resources discussed if needed. Moreover, Satin agreed information may be shared with other [facility] providers as needed for coordination of care, and written consent was obtained.   Chief Complaint: Aleyna was referred by Dr. Dennard Nip due to depression with emotional eating behaviors. Per the note for the visit with Dr. Dennard Nip on September 12, 2018, "Kaidence is not on an antidepressant, but cried repeatedly in the visit discussing her weight struggles and feelings of failure with regaining weight after gastric bypass surgery. She feels her weight causes her life to not be worth living at least 50% of the time. She is struggling with emotional eating and using food for comfort to the extent that it is negatively impacting her health. She often snacks when she is not hungry. Camisha sometimes feels she is out of control and then feels guilty  that she made poor food choices. She shows no sign of suicidal or homicidal ideations."  During today's appointment, Charlie reported, "I'm a gastric bypass patient, and I had surgery around 2001." She discussed gaining weight after the surgery, and noted, "I don't think I had the mental health support at the the time with the changes that came with eating." Sharia shared, "I just want to figure out why I can't seem to get motivated to lose weight." She indicated since starting the clinic, she is "trying to stick to it." Prior to starting, she discussed a history of mindless eating while working and watching television. Moreover, Kalista shared she tends to crave "junk food," such as pretzels, chips, cookies, and cake.   Britanie was asked to complete a questionnaire assessing various behaviors related to emotional eating. Ketura endorsed the following: overeat when you are celebrating, experience food cravings on a regular basis, eat certain foods when you are anxious, stressed, depressed, or your feelings are hurt, use food to help you cope with emotional situations, find food is comforting to you, overeat when you are angry or upset, overeat frequently when you are bored or lonely, not worry about what you eat when you are in a good mood, overeat when you are angry at someone just to show them they cannot control you, overeat when you are alone, but eat much less when you are with other people and eat as a reward.  HPI: Per the note for the initial visit with Dr. Dennard Nip on September 12, 2018, Zalika reported experiencing the following: significant food cravings issues , snacking frequently in the evenings, frequently drinking liquids with calories, frequently making poor food choices, frequently eating larger portions than normal , binge eating behaviors and struggling with emotional  eating.   During today's appointment, Ziza reported a history of binge eating. She explained she  would "eat more than [she] should." She explained she would sometimes "eat all the bad things." Regarding a typical binge, Gayle reported consuming a 1/2 bag of chips, and two to three servings of a sweet snack. She discussed a history of difficulty with portion control. Boyd denied a history of purging and engagement in other compensatory strategies, and has never been diagnosed with an eating disorder. Regarding weight gain, Reata shared she began gaining weight around puberty, and noted her parents were frequently arguing and later separated. She noted she was also "teased a lot at school." Kandas indicated, "The teasing and arguing at home was a driver in emotional eating."   Mental Status Examination: Nailani arrived on time for the appointment. She presented as appropriately dressed and groomed. Tilley appeared her stated age and demonstrated adequate orientation to time, place, person, and purpose of the appointment. She also demonstrated appropriate eye contact. No psychomotor abnormalities or behavioral peculiarities noted. Her mood was euthymic with congruent affect; however, she was observed becoming tearful sharing about her mother's health. Her thought processes were logical, linear, and goal-directed. No hallucinations, delusions, bizarre thinking or behavior reported or observed. Judgment, insight, and impulse control appeared to be grossly intact. There was no evidence of paraphasias (i.e., errors in speech, gross mispronunciations, and word substitutions), repetition deficits, or disturbances in volume or prosody (i.e., rhythm and intonation). There was no evidence of attention or memory impairments. Azyria denied current suicidal and homicidal ideation, plan, and intent.   Family & Psychosocial History: Eldene reported she has been married for 9 years, and she shared she has a 75 year old step-son. 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 Gwendolin stated her highest level of education is some college. She shared her social support system consists of her friends, and husband. She explained she and her friends will get together for group parties with their spouses. Moreover, Sher stated she does not identify with any religion, bu noted, "I was raised [place]."   Medical History:  Past Medical History:  Diagnosis Date  . Anemia    low iron  . Back pain   . Foot pain    from previous fracture of toe 2010  . Gallbladder problem    removed  . Headache    occasional  . Hypertension    no longer on medications since weight loss  . Joint pain   . Obesity   . Plantar fasciitis   . Vitamin B 12 deficiency   . Vitamin D deficiency    Past Surgical History:  Procedure Laterality Date  . CHOLECYSTECTOMY  2013  . COLONOSCOPY WITH PROPOFOL N/A 03/06/2016   Procedure: COLONOSCOPY WITH PROPOFOL;  Surgeon: Arta Silence, MD;  Location: [facility] ENDOSCOPY;  Service: Endoscopy;  Laterality: N/A;  . gastric bypass surgery  2001  . PILONIDAL CYST EXCISION    . WISDOM TOOTH EXTRACTION     Current Outpatient Medications on File Prior to Visit  Medication Sig Dispense Refill  . aspirin-acetaminophen-caffeine (EXCEDRIN MIGRAINE) 250-250-65 MG tablet Take 1 tablet by mouth every 6 (six) hours as needed for headache.    . cholecalciferol (VITAMIN D) 1000 units tablet Take 1,000 Units by mouth daily.    . cyanocobalamin 500 MCG tablet Take 500 mcg by mouth daily.    . ferrous sulfate 325 (65 FE) MG tablet Take 325 mg by mouth daily  with breakfast.    . Glucosamine-Chondroit-Vit C-Mn (GLUCOSAMINE CHONDR 1500 COMPLX) CAPS Take 1 capsule by mouth daily.    . levonorgestrel (MIRENA) 20 MCG/24HR IUD 1 each by Intrauterine route once.    . Multiple Vitamins-Minerals (MULTIVITAMIN WOMEN) TABS Take 1 tablet by mouth daily.    . naproxen (NAPROSYN) 500 MG tablet Take 500 mg by mouth 2 (two) times daily with a meal.    . vitamin C  (ASCORBIC ACID) 500 MG tablet Take 500 mg by mouth daily.     No current facility-administered medications on file prior to visit.   Malka denied a history of head injuries and loss of consciousness.   Mental Health History: Celena denied a history of therapeutic and psychiatric services; however, she noted she met with a psychologist to be approved for bariatric surgery. She denied ever being prescribed psychotropics, and also denied a history of hospitalizations for psychiatric concerns. When asked about family history of mental health related concerns, Charmane noted, "I see signs of depression with my mom and my [materna] grandmother, but they've never been officially diagnosed." Eman denied a trauma history, including psychological, physical  and sexual abuse, as well as neglect.   Joei described her typical mood as "meh, routine." She endorsed difficulty falling asleep and shared she averages approximately 6 to 8 hours of sleep a night. She endorsed decreased self-esteem due to weight and her history of teasing. Currently, she indicated she worries about her mother's health, and family relationships. In addition, she endorsed occasional angry outbursts characterized by "cussing." She denied angry outbursts ever being physical in nature. Marnisha denied a history of illicit and recreational substance use, and noted consuming alcohol occasionally when celebrating. Vendela further endorsed experiencing the following: depressed mood, fatigue, irritability, and crying spells.  Pegah denied experiencing the following: anhedonia, decreased motivation, hopelessness, appetite issues, attention and concentration issues, memory concerns, feeling fidgety/restless, obsessions and compulsions, hallucinations and delusions, paranoia, mania, angry outbursts, social withdrawal and panic attacks. She also denied history of and current suicidal ideation, plan, and intent; history of and current  homicidal ideation, plan, and intent; and history of and current engagement in self-harm. Notably, Ivonne endorsed item 9 (i.e., "Do you feel that your weight problem is so hopeless that sometimes life doesn't seem worth living?") on the modified PHQ-9 during her initial appointment with Dr. Beasley on September 12, 2018. She explained she endorsed that item due to "the relationship [she has] with food." She added, "I have never thought about hurting or killing myself."  The following strengths were reported by Rolena: intelligent, compassionate, and caring. The following strengths were observed by this provider: ability to express thoughts and feelings during the therapeutic session, ability to establish and benefit from a therapeutic relationship, ability to learn and practice coping skills, willingness to work toward established goal(s) with the clinic and ability to engage in reciprocal conversation.  Legal History: Caira denied a history of legal involvement.   Structured Assessment Results: The Patient Health Questionnaire-9 (PHQ-9) is a self-report measure that assesses symptoms and severity of depression over the course of the last two weeks. Carilyn obtained a score of 4 suggesting minimal depression. Janet finds the endorsed symptoms to be somewhat difficult. Depression screen PHQ 2/9 09/22/2018  Decreased Interest 0  Down, Depressed, Hopeless 1  PHQ - 2 Score 1  Altered sleeping 1  Tired, decreased energy 1  Change in appetite 0  Feeling bad or failure about yourself  1  Trouble concentrating 0    Moving slowly or fidgety/restless 0  Suicidal thoughts 0  PHQ-9 Score 4  Difficult doing work/chores -   The Generalized Anxiety Disorder-7 (GAD-7) is a brief self-report measure that assesses symptoms of anxiety over the course of the last two weeks. Hawley obtained a score of 8 suggesting mild anxiety. GAD 7 : Generalized Anxiety Score 09/22/2018  Nervous, Anxious, on Edge 2    Control/stop worrying 1  Worry too much - different things 1  Trouble relaxing 1  Restless 0  Easily annoyed or irritable 2  Afraid - awful might happen 1  Total GAD 7 Score 8  Anxiety Difficulty Somewhat difficult   Interventions: A chart review was conducted prior to the clinical intake interview. The PHQ-9, and GAD-7 were administered and a clinical intake interview was completed. In addition, Braylyn was asked to complete a Mood and Food questionnaire to assess various behaviors related to emotional eating. Throughout session, empathic reflections and validation was provided. Continuing treatment with this provider was discussed and a treatment goal was established. Psychoeducation regarding emotional versus physical hunger was provided. Janete was given a handout to utilize between now and the next appointment to increase awareness of hunger patterns and subsequent eating.   Provisional DSM-5 Diagnosis: 311 (F32.8) Other Specified Depressive Disorder, Emotional Eating Behaviors  Plan: Cherrie appears able and willing to participate as evidenced by collaboration on a treatment goal, engagement in reciprocal conversation, and asking questions as needed for clarification. The next appointment will be scheduled in two weeks. The following treatment goal was established: decrease emotional eating. For the aforementioned goal, Kiante can benefit from biweekly individual therapy sessions that are brief in duration for approximately four to six sessions. The treatment modality will be individual therapeutic services, including an eclectic therapeutic approach utilizing techniques from Cognitive Behavioral Therapy, Patient Centered Therapy, Dialectical Behavior Therapy, Acceptance and Commitment Therapy, Interpersonal Therapy, and Cognitive Restructuring. Therapeutic approach will include various interventions as appropriate, such as validation, support, mindfulness, thought defusion, reframing,  psychoeducation, values assessment, and role playing. This provider will regularly review the treatment plan and medical chart to keep informed of status changes. Maclaine expressed understanding and agreement with the initial treatment plan of care.    

## 2018-09-26 ENCOUNTER — Ambulatory Visit (INDEPENDENT_AMBULATORY_CARE_PROVIDER_SITE_OTHER): Payer: BLUE CROSS/BLUE SHIELD | Admitting: Family Medicine

## 2018-09-26 ENCOUNTER — Encounter (INDEPENDENT_AMBULATORY_CARE_PROVIDER_SITE_OTHER): Payer: Self-pay | Admitting: Family Medicine

## 2018-09-26 VITALS — BP 135/79 | HR 79 | Temp 97.8°F | Ht 65.0 in | Wt 333.0 lb

## 2018-09-26 DIAGNOSIS — K5909 Other constipation: Secondary | ICD-10-CM | POA: Diagnosis not present

## 2018-09-26 DIAGNOSIS — E8881 Metabolic syndrome: Secondary | ICD-10-CM

## 2018-09-26 DIAGNOSIS — Z6841 Body Mass Index (BMI) 40.0 and over, adult: Secondary | ICD-10-CM

## 2018-09-26 DIAGNOSIS — Z9189 Other specified personal risk factors, not elsewhere classified: Secondary | ICD-10-CM | POA: Diagnosis not present

## 2018-09-26 NOTE — Progress Notes (Signed)
Office: [phone]  /  Fax: [phone]   HPI:   Chief Complaint: OBESITY Rebecca Dominguez is here to discuss her progress with her obesity treatment plan. She is on the Category 3 plan and the Category 4 microwave meals and is following her eating plan approximately 100 % of the time. She states she is exercising 0 minutes 0 times per week. Rebecca Dominguez did well with weight loss, but still ate out some. She had trouble with meal planning and prepping. She notes emotional eating, especially for dinner. Rebecca Dominguez is status post Roux-en-Y gastric bypass surgery.  Her weight is (!) 333 lb (151 kg) today and has had a weight loss of 5 pounds over a period of 2 weeks since her last visit. She has lost 5 lbs since starting treatment with [place].  Constipation Rebecca Dominguez notes constipation for the last few weeks, worse since attempting weight loss. She notes decreased BM frequency on her eating plan, but BM's are not hard and painful. She denies rectal bleeding.  Insulin Resistance Rebecca Dominguez has a new diagnosis of insulin resistance based on her mildly elevated fasting insulin level >5. Her A1c  And glucose were normal. Although Rebecca Dominguez's blood glucose readings are still under good control, insulin resistance puts her at greater risk of metabolic syndrome and diabetes. She is not taking metformin currently and continues to work on diet and exercise to decrease risk of diabetes. Rebecca Dominguez notes some increased polyphagia at times.  At risk for diabetes Rebecca Dominguez is at higher than average risk for developing diabetes due to her insulin resistance and obesity. She currently denies polyuria or polydipsia.  ASSESSMENT AND PLAN:  Other constipation  Insulin resistance  At risk for diabetes mellitus  Class 3 severe obesity with serious comorbidity and body mass index (BMI) of 50.0 to 59.9 in adult, unspecified obesity type (HCC)  PLAN:  Constipation Rebecca Dominguez was informed decrease bowel movement frequency  is normal while losing weight, but stools should not be hard or painful. She was advised to increase her H20 intake and work on increasing her fiber intake. High fiber foods were discussed today. She is ok to take OTC Colace if needed. Rebecca Dominguez agrees to follow up in 4 weeks.  Insulin Resistance Rebecca Dominguez will continue to work on weight loss, exercise, and decreasing simple carbohydrates in her diet to help decrease the risk of diabetes. We discussed metformin including benefits and risks. She was informed that eating too many simple carbohydrates or too many calories at one sitting increases the likelihood of GI side effects. Rebecca Dominguez deferred metformin for now and agreed to continue her diet and exercise. Rebecca Dominguez agreed to follow up with [place] as directed to monitor her progress.  Diabetes risk counseling Rebecca Dominguez was given extended (15 minutes) diabetes prevention counseling today. She is 43 y.o. female and has risk factors for diabetes including insulin resistance and obesity. We discussed intensive lifestyle modifications today with an emphasis on weight loss as well as increasing exercise and decreasing simple carbohydrates in her diet.  Obesity Rebecca Dominguez is currently in the action stage of change. As such, her goal is to continue with weight loss efforts. She has agreed to follow the Category 3 plan and to keep a food journal with 500 to 600 calories and 40+ grams of protein for dinner. Rebecca Dominguez has been instructed to work up to a goal of 150 minutes of combined cardio and strengthening exercise per week for weight loss and overall health benefits. We discussed the following Behavioral Modification Strategies today: increasing lean  protein intake, decreasing simple carbohydrates, work on meal planning and easy cooking plans, and emotional eating strategies. Rebecca Dominguez has agreed to see Rebecca Dominguez, our registered dietitian in 2 weeks to review meal plan.  Rebecca Dominguez has agreed to follow up with  our clinic in 4 weeks. She was informed of the importance of frequent follow up visits to maximize her success with intensive lifestyle modifications for her multiple health conditions.  ALLERGIES: No Known Allergies  MEDICATIONS: Current Outpatient Medications on File Prior to Visit  Medication Sig Dispense Refill  . aspirin-acetaminophen-caffeine (EXCEDRIN MIGRAINE) 250-250-65 MG tablet Take 1 tablet by mouth every 6 (six) hours as needed for headache.    . cholecalciferol (VITAMIN D) 1000 units tablet Take 1,000 Units by mouth daily.    . cyanocobalamin 500 MCG tablet Take 500 mcg by mouth daily.    . ferrous sulfate 325 (65 FE) MG tablet Take 325 mg by mouth daily with breakfast.    . Glucosamine-Chondroit-Vit C-Mn (GLUCOSAMINE CHONDR 1500 COMPLX) CAPS Take 1 capsule by mouth daily.    Marland Kitchen levonorgestrel (MIRENA) 20 MCG/24HR IUD 1 each by Intrauterine route once.    . Multiple Vitamins-Minerals (MULTIVITAMIN WOMEN) TABS Take 1 tablet by mouth daily.    . naproxen (NAPROSYN) 500 MG tablet Take 500 mg by mouth 2 (two) times daily with a meal.    . vitamin C (ASCORBIC ACID) 500 MG tablet Take 500 mg by mouth daily.     No current facility-administered medications on file prior to visit.     PAST MEDICAL HISTORY: Past Medical History:  Diagnosis Date  . Anemia    low iron  . Back pain   . Foot pain    from previous fracture of toe 2010  . Gallbladder problem    removed  . Headache    occasional  . Hypertension    no longer on medications since weight loss  . Joint pain   . Obesity   . Plantar fasciitis   . Vitamin B 12 deficiency   . Vitamin D deficiency     PAST SURGICAL HISTORY: Past Surgical History:  Procedure Laterality Date  . CHOLECYSTECTOMY  2013  . COLONOSCOPY WITH PROPOFOL N/A 03/06/2016   Procedure: COLONOSCOPY WITH PROPOFOL;  Surgeon: Willis Modena, MD;  Location: [facility] ENDOSCOPY;  Service: Endoscopy;  Laterality: N/A;  . gastric bypass surgery  2001  .  PILONIDAL CYST EXCISION    . WISDOM TOOTH EXTRACTION      SOCIAL HISTORY: Social History   Tobacco Use  . Smoking status: Never Smoker  . Smokeless tobacco: Never Used  Substance Use Topics  . Alcohol use: Yes    Comment: rare/special occasion  . Drug use: No    FAMILY HISTORY: Family History  Problem Relation Age of Onset  . Breast cancer Mother   . Obesity Mother   . Colon cancer Father   . Diabetes Father   . Obesity Father     ROS: Review of Systems  Constitutional: Positive for weight loss.  Gastrointestinal: Positive for constipation.       Negative for rectal bleeding.  Genitourinary:       Negative for polyuria.  Endo/Heme/Allergies: Negative for polydipsia.       Positive for polyphagia.    PHYSICAL EXAM: Blood pressure 135/79, pulse 79, temperature 97.8 F (36.6 C), height  (1.651 m), weight (!) 333 lb (151 kg), SpO2 97 %. Body mass index is 55.41 kg/m. Physical Exam Vitals signs reviewed.  Constitutional:  Appearance: Normal appearance. She is obese.  Cardiovascular:     Rate and Rhythm: Normal rate.  Pulmonary:     Effort: Pulmonary effort is normal.  Musculoskeletal: Normal range of motion.  Skin:    General: Skin is warm and dry.  Neurological:     Mental Status: She is alert and oriented to person, place, and time.  Psychiatric:        Mood and Affect: Mood normal.        Behavior: Behavior normal.     RECENT LABS AND TESTS: BMET    Component Value Date/Time   NA 136 09/12/2018 0957   K 4.2 09/12/2018 0957   CL 100 09/12/2018 0957   CO2 21 09/12/2018 0957   GLUCOSE 79 09/12/2018 0957   BUN 16 09/12/2018 0957   CREATININE 0.71 09/12/2018 0957   CALCIUM 9.8 09/12/2018 0957   GFRNONAA 105 09/12/2018 0957   GFRAA 121 09/12/2018 0957   Lab Results  Component Value Date   HGBA1C 5.3 09/12/2018   Lab Results  Component Value Date   INSULIN 8.8 09/12/2018   CBC    Component Value Date/Time   WBC 6.8 09/12/2018 0957    RBC 4.71 09/12/2018 0957   HGB 12.6 09/12/2018 0957   HCT 38.5 09/12/2018 0957   MCV 82 09/12/2018 0957   MCH 26.8 09/12/2018 0957   MCHC 32.7 09/12/2018 0957   RDW 15.3 09/12/2018 0957   LYMPHSABS 1.7 09/12/2018 0957   EOSABS 0.2 09/12/2018 0957   BASOSABS 0.0 09/12/2018 0957   Iron/TIBC/Ferritin/ %Sat    Component Value Date/Time   IRON 47 09/12/2018 0957   TIBC 405 09/12/2018 0957   FERRITIN 17 09/12/2018 0957   IRONPCTSAT 12 (L) 09/12/2018 0957   Lipid Panel     Component Value Date/Time   CHOL 152 09/12/2018 0957   TRIG 48 09/12/2018 0957   HDL 63 09/12/2018 0957   LDLCALC 79 09/12/2018 0957   Hepatic Function Panel     Component Value Date/Time   PROT 6.9 09/12/2018 0957   ALBUMIN 4.4 09/12/2018 0957   AST 13 09/12/2018 0957   ALT 14 09/12/2018 0957   ALKPHOS 92 09/12/2018 0957   BILITOT 0.3 09/12/2018 0957      Component Value Date/Time   TSH 4.350 09/12/2018 0957   Results for RONIN, CRAGER (MRN [id number]) as of 09/26/2018 13:36  Ref. Range 09/12/2018 09:57  Vitamin D, 25-Hydroxy Latest Ref Range: 30.0 - 100.0 ng/mL 67.3   OBESITY BEHAVIORAL INTERVENTION VISIT  Today's visit was # 2  Starting weight: 338 lbs Starting date: 09/12/18 Today's weight : Weight: (!) 333 lb (151 kg)  Today's date: 09/26/2018 Total lbs lost to date: 5    09/26/2018  Height  (1.651 m)  Weight 333 lb (151 kg) (A)  BMI (Calculated) 55.41  BLOOD PRESSURE - SYSTOLIC 135  BLOOD PRESSURE - DIASTOLIC 79   Body Fat % 58.2 %   ASK: We discussed the diagnosis of obesity with Rebecca Dominguez today and [name] agreed to give [place] permission to discuss obesity behavioral modification therapy today.  ASSESS: Shontel has the diagnosis of obesity and her BMI today is 55.41. Jamia is in the action stage of change.  ADVISE: Amiyah was educated on the multiple health risks of obesity as well as the benefit of weight loss to improve her health. She was advised of  the need for long term treatment and the importance of lifestyle modifications to improve her current  health and to decrease her risk of future health problems.  AGREE: Multiple dietary modification options and treatment options were discussed and Anisha agreed to follow the recommendations documented in the above note.  ARRANGE: Damie was educated on the importance of frequent visits to treat obesity as outlined per CMS and USPSTF guidelines and agreed to schedule her next follow up appointment today.  IKirke Corin, CMA, am acting as transcriptionist for Wilder Glade, MD  I have reviewed the above documentation for accuracy and completeness, and I agree with the above. -Quillian Quince, MD

## 2018-10-03 NOTE — Progress Notes (Signed)
Office: [phone]  /  Fax: [phone]    Date: 10/05/2018   Time Seen: 7:52am Duration: 37 minutes Provider: Lawerance Cruel, Psy.D. Type of Session: Individual Therapy  Type of Contact: Face-to-face  Session Content: Rebecca Dominguez is a 43 y.o. female presenting for a follow-up appointment to address the previously established treatment goal of decreasing emotional eating. The session was initiated with the administration of the PHQ-9 and GAD-7, as well as a brief check-in. Rebecca Dominguez expressed worry regarding the coronavirus. Thus, thoughts and feelings were processed. Moreover, due to uncertainty related to the coronavirus, this provider and Rebecca Dominguez discussed using emergency resources shared during the first appointment should this provider's office be closed. Rebecca Dominguez shared she still has the handout for emergency resources, and was agreeable to using the resources if needed. In addition, referral options for providers in the area offering teletherapy were discussed. Rebecca Dominguez expressed understanding, and was receptive to this provider sending her a MyChart message with options for providers should this provider's clinic close. Regarding emotional eating, Rebecca Dominguez stated, "I think it is better. I'm more aware." Psychoeducation regarding triggers for emotional eating was provided. Rebecca Dominguez was provided a handout, and encouraged to utilize the handout between now and the next appointment to increase awareness of triggers and frequency. Rebecca Dominguez agreed. This provider also discussed behavioral strategies for specific triggers, such as placing the utensil down when conversing to avoid mindless eating. Rebecca Dominguez was receptive to today's session as evidenced by openness to sharing, responsiveness to feedback, and willingness to identify triggers for emotional eating.  Mental Status Examination: Rebecca Dominguez arrived early for the appointment. She presented as appropriately dressed and groomed. Rebecca Dominguez  appeared her stated age and demonstrated adequate orientation to time, place, person, and purpose of the appointment. She also demonstrated appropriate eye contact. No psychomotor abnormalities or behavioral peculiarities noted. Her mood was euthymic with congruent affect. Her thought processes were logical, linear, and goal-directed. No hallucinations, delusions, bizarre thinking or behavior reported or observed. Judgment, insight, and impulse control appeared to be grossly intact. There was no evidence of paraphasias (i.e., errors in speech, gross mispronunciations, and word substitutions), repetition deficits, or disturbances in volume or prosody (i.e., rhythm and intonation). There was no evidence of attention or memory impairments. Rebecca Dominguez denied current suicidal and homicidal ideation, plan and intent.   Structured Assessment Results: The Patient Health Questionnaire-9 (PHQ-9) is a self-report measure that assesses symptoms and severity of depression over the course of the last two weeks. Rebecca Dominguez obtained a score of 2 suggesting minimal depression. Rebecca Dominguez finds the endorsed symptoms to be not difficult at all. Depression screen Rebecca Dominguez 2/9 10/05/2018  Decreased Interest 0  Down, Depressed, Hopeless 0  PHQ - 2 Score 0  Altered sleeping 1  Tired, decreased energy 1  Change in appetite 0  Feeling bad or failure about yourself  0  Trouble concentrating 0  Moving slowly or fidgety/restless 0  Suicidal thoughts 0  PHQ-9 Score 2  Difficult doing work/chores -   The Generalized Anxiety Disorder-7 (GAD-7) is a brief self-report measure that assesses symptoms of anxiety over the course of the last two weeks. Rebecca Dominguez obtained a score of 5 suggesting mild anxiety. GAD 7 : Generalized Anxiety Score 10/05/2018  Nervous, Anxious, on Edge 1  Control/stop worrying 1  Worry too much - different things 1  Trouble relaxing 0  Restless 0  Easily annoyed or irritable 1  Afraid - awful might happen 1   Total GAD 7 Score 5  Anxiety Difficulty Not difficult at all  Interventions:  Administration of PHQ-9 and GAD-7 for symptom monitoring Review of content from the previous session Empathic reflections and validation Processing thoughts and feelings Psychoeducation regarding triggers for emotional eating Positive reinforcement Rapport building Brief chart review  DSM-5 Diagnosis: 311 (F32.8) Other Specified Depressive Disorder, Emotional Eating Behaviors  Treatment Goal & Progress: During the initial appointment with this provider, the following treatment goal was established: decrease emotional eating. Progress is limited, as Rebecca Dominguez has just begun treatment with this provider; however, she is receptive to the interaction and interventions and rapport is being established. Nevertheless, Rebecca Dominguez has demonstrated some progress in her goal as evidenced by increased awareness of hunger patterns.   Plan: Rebecca Dominguez continues to appear able and willing to participate as evidenced by engagement in reciprocal conversation, and asking questions for clarification as appropriate. In order to coordinate appointments with the clinic, the next appointment will be scheduled in 2-3 weeks. The next session will focus on reviewing triggers for emotional eating, and working towards the established treatment goal. Additionally, this provider will send Rebecca Dominguez a [employer] message with options for providers offering teletherapy should this provider's clinic close.

## 2018-10-04 ENCOUNTER — Encounter (INDEPENDENT_AMBULATORY_CARE_PROVIDER_SITE_OTHER): Payer: Self-pay

## 2018-10-05 ENCOUNTER — Other Ambulatory Visit: Payer: Self-pay

## 2018-10-05 ENCOUNTER — Ambulatory Visit (INDEPENDENT_AMBULATORY_CARE_PROVIDER_SITE_OTHER): Payer: BLUE CROSS/BLUE SHIELD | Admitting: Psychology

## 2018-10-05 ENCOUNTER — Encounter (INDEPENDENT_AMBULATORY_CARE_PROVIDER_SITE_OTHER): Payer: Self-pay

## 2018-10-05 DIAGNOSIS — F3289 Other specified depressive episodes: Secondary | ICD-10-CM | POA: Diagnosis not present

## 2018-10-10 ENCOUNTER — Ambulatory Visit (INDEPENDENT_AMBULATORY_CARE_PROVIDER_SITE_OTHER): Payer: BLUE CROSS/BLUE SHIELD | Admitting: Family Medicine

## 2018-10-10 ENCOUNTER — Other Ambulatory Visit: Payer: Self-pay

## 2018-10-10 VITALS — BP 122/79 | HR 80 | Temp 97.8°F | Ht 65.0 in | Wt 330.0 lb

## 2018-10-10 DIAGNOSIS — K5909 Other constipation: Secondary | ICD-10-CM | POA: Diagnosis not present

## 2018-10-10 DIAGNOSIS — E8881 Metabolic syndrome: Secondary | ICD-10-CM | POA: Diagnosis not present

## 2018-10-10 DIAGNOSIS — Z6841 Body Mass Index (BMI) 40.0 and over, adult: Secondary | ICD-10-CM

## 2018-10-10 NOTE — Progress Notes (Signed)
Office: [phone]  /  Fax: [phone]   HPI:   Chief Complaint: OBESITY Rebecca Dominguez is here to discuss her progress with her obesity treatment plan. She is on the keep a food journal with 500-600 calories and 40+ grams of protein at supper daily and follow the Category 3 plan and is following her eating plan approximately 100 % of the time. She states she is exercising 0 minutes 0 times per week. Rebecca Dominguez is having trouble finding bread due to the COVID-19. She is getting all the protein in on the plan. Sometimes she wants to snack when she is not hungry.  Her weight is (!) 330 lb (149.7 kg) today and has had a weight loss of 3 pounds over a period of 2 weeks since her last visit. She has lost 8 lbs since starting treatment with [place].  Insulin Resistance Rebecca Dominguez has a diagnosis of insulin resistance based on her elevated fasting insulin level >5. Although Rebecca Dominguez's blood glucose readings are still under good control, insulin resistance puts her at greater risk of metabolic syndrome and diabetes. She is not taking metformin currently and continues to work on diet and exercise to decrease risk of diabetes. She denies polyphagia. Lab Results  Component Value Date   HGBA1C 5.3 09/12/2018    Constipation Rebecca Dominguez notes constipation and bowel habits have changed recently with BMs less frequent. She denies hematochezia or melena. She is taking Fiber gummies which is helping.  ASSESSMENT AND PLAN:  Insulin resistance  Other constipation  Class 3 severe obesity with serious comorbidity and body mass index (BMI) of 50.0 to 59.9 in adult, unspecified obesity type (HCC)  PLAN:  Insulin Resistance [name] will continue her meal plan, and continue to work on weight loss, exercise, and decreasing simple carbohydrates in her diet to help decrease the risk of diabetes. We dicussed metformin including benefits and risks. She was informed that eating too many simple carbohydrates or too many  calories at one sitting increases the likelihood of GI side effects. Allyne declined metformin for now and prescription was not written today. [name] agrees to follow up with our clinic in 2 weeks as directed to monitor her progress.  Constipation [name] was informed decrease bowel movement frequency is normal while losing weight, but stools should not be hard or painful. She was advised to increase her H20 intake and work on increasing her fiber intake. She will continue her Fiber gummies. High fiber foods were discussed today.  I spent > than 50% of the 15 minute visit on counseling as documented in the note.  Obesity Rebecca Dominguez is currently in the action stage of change. As such, her goal is to continue with weight loss efforts She has agreed to follow the Category 3 plan with breakfast options Rebecca Dominguez may substitute 100 calories of carbohydrate (prefer whole wheat) for bread.  Rebecca Dominguez has not been prescribed exercise at this time. We discussed the following Behavioral Modification Strategies today: keeping healthy foods in the home and planning for success Handout was given: Smart Fruit.  Rebecca Dominguez has agreed to follow up with our clinic in 2 weeks. She was informed of the importance of frequent follow up visits to maximize her success with intensive lifestyle modifications for her multiple health conditions.  ALLERGIES: No Known Allergies  MEDICATIONS: Current Outpatient Medications on File Prior to Visit  Medication Sig Dispense Refill   aspirin-acetaminophen-caffeine (EXCEDRIN MIGRAINE) 250-250-65 MG tablet Take 1 tablet by mouth every 6 (six) hours as needed for headache.  cholecalciferol (VITAMIN D) 1000 units tablet Take 1,000 Units by mouth daily.     cyanocobalamin 500 MCG tablet Take 500 mcg by mouth daily.     ferrous sulfate 325 (65 FE) MG tablet Take 325 mg by mouth daily with breakfast.     Glucosamine-Chondroit-Vit C-Mn (GLUCOSAMINE CHONDR 1500  COMPLX) CAPS Take 1 capsule by mouth daily.     levonorgestrel (MIRENA) 20 MCG/24HR IUD 1 each by Intrauterine route once.     Multiple Vitamins-Minerals (MULTIVITAMIN WOMEN) TABS Take 1 tablet by mouth daily.     naproxen (NAPROSYN) 500 MG tablet Take 500 mg by mouth 2 (two) times daily with a meal.     vitamin C (ASCORBIC ACID) 500 MG tablet Take 500 mg by mouth daily.     No current facility-administered medications on file prior to visit.     PAST MEDICAL HISTORY: Past Medical History:  Diagnosis Date   Anemia    low iron   Back pain    Foot pain    from previous fracture of toe 2010   Gallbladder problem    removed   Headache    occasional   Hypertension    no longer on medications since weight loss   Joint pain    Obesity    Plantar fasciitis    Vitamin B 12 deficiency    Vitamin D deficiency     PAST SURGICAL HISTORY: Past Surgical History:  Procedure Laterality Date   CHOLECYSTECTOMY  2013   COLONOSCOPY WITH PROPOFOL N/A 03/06/2016   Procedure: COLONOSCOPY WITH PROPOFOL;  Surgeon: Willis Modena, MD;  Location: [facility] ENDOSCOPY;  Service: Endoscopy;  Laterality: N/A;   gastric bypass surgery  2001   PILONIDAL CYST EXCISION     WISDOM TOOTH EXTRACTION      SOCIAL HISTORY: Social History   Tobacco Use   Smoking status: Never Smoker   Smokeless tobacco: Never Used  Substance Use Topics   Alcohol use: Yes    Comment: rare/special occasion   Drug use: No    FAMILY HISTORY: Family History  Problem Relation Age of Onset   Breast cancer Mother    Obesity Mother    Colon cancer Father    Diabetes Father    Obesity Father     ROS: Review of Systems  Constitutional: Positive for weight loss.  Gastrointestinal: Positive for constipation. Negative for melena.       Negative hematochezia  Endo/Heme/Allergies:       Negative polyphagia    PHYSICAL EXAM: Blood pressure 122/79, pulse 80, temperature 97.8 F (36.6 C), height 5'  5" (1.651 m), weight (!) 330 lb (149.7 kg), SpO2 97 %. Body mass index is 54.91 kg/m. Physical Exam Vitals signs reviewed.  Constitutional:      Appearance: Normal appearance. She is obese.  Cardiovascular:     Rate and Rhythm: Normal rate.     Pulses: Normal pulses.  Pulmonary:     Effort: Pulmonary effort is normal.     Breath sounds: Normal breath sounds.  Musculoskeletal: Normal range of motion.  Skin:    General: Skin is warm and dry.  Neurological:     Mental Status: She is alert and oriented to person, place, and time.  Psychiatric:        Mood and Affect: Mood normal.        Behavior: Behavior normal.     RECENT LABS AND TESTS: BMET    Component Value Date/Time   NA 136 09/12/2018 0957  K 4.2 09/12/2018 0957   CL 100 09/12/2018 0957   CO2 21 09/12/2018 0957   GLUCOSE 79 09/12/2018 0957   BUN 16 09/12/2018 0957   CREATININE 0.71 09/12/2018 0957   CALCIUM 9.8 09/12/2018 0957   GFRNONAA 105 09/12/2018 0957   GFRAA 121 09/12/2018 0957   Lab Results  Component Value Date   HGBA1C 5.3 09/12/2018   Lab Results  Component Value Date   INSULIN 8.8 09/12/2018   CBC    Component Value Date/Time   WBC 6.8 09/12/2018 0957   RBC 4.71 09/12/2018 0957   HGB 12.6 09/12/2018 0957   HCT 38.5 09/12/2018 0957   MCV 82 09/12/2018 0957   MCH 26.8 09/12/2018 0957   MCHC 32.7 09/12/2018 0957   RDW 15.3 09/12/2018 0957   LYMPHSABS 1.7 09/12/2018 0957   EOSABS 0.2 09/12/2018 0957   BASOSABS 0.0 09/12/2018 0957   Iron/TIBC/Ferritin/ %Sat    Component Value Date/Time   IRON 47 09/12/2018 0957   TIBC 405 09/12/2018 0957   FERRITIN 17 09/12/2018 0957   IRONPCTSAT 12 (L) 09/12/2018 0957   Lipid Panel     Component Value Date/Time   CHOL 152 09/12/2018 0957   TRIG 48 09/12/2018 0957   HDL 63 09/12/2018 0957   LDLCALC 79 09/12/2018 0957   Hepatic Function Panel     Component Value Date/Time   PROT 6.9 09/12/2018 0957   ALBUMIN 4.4 09/12/2018 0957   AST 13  09/12/2018 0957   ALT 14 09/12/2018 0957   ALKPHOS 92 09/12/2018 0957   BILITOT 0.3 09/12/2018 0957      Component Value Date/Time   TSH 4.350 09/12/2018 0957      OBESITY BEHAVIORAL INTERVENTION VISIT  Today's visit was # 3   Starting weight: 338 lbs Starting date: 09/12/2018 Today's weight : 330 lbs Today's date: 10/10/2018 Total lbs lost to date: 8    10/10/2018  Height 5\' 5"  (1.651 m)  Weight 330 lb (149.7 kg) (A)  BMI (Calculated) 54.91  BLOOD PRESSURE - SYSTOLIC 122  BLOOD PRESSURE - DIASTOLIC 79   Body Fat % 57.6 %  Total Body Water (lbs) 106 lbs     ASK: We discussed the diagnosis of obesity with Evonnie Pat today and [name] agreed to give [place] permission to discuss obesity behavioral modification therapy today.  ASSESS: Abigaile has the diagnosis of obesity and her BMI today is 54.91 Astyn is in the action stage of change   ADVISE: Erini was educated on the multiple health risks of obesity as well as the benefit of weight loss to improve her health. She was advised of the need for long term treatment and the importance of lifestyle modifications to improve her current health and to decrease her risk of future health problems.  AGREE: Multiple dietary modification options and treatment options were discussed and  Hedy agreed to follow the recommendations documented in the above note.  ARRANGE: Madolynn was educated on the importance of frequent visits to treat obesity as outlined per CMS and USPSTF guidelines and agreed to schedule her next follow up appointment today.  Trude Mcburney, am acting as Energy manager for [place], FNP-C.  I have reviewed the above documentation for accuracy and completeness, and I agree with the above.  - Elianys Conry, FNP-C.

## 2018-10-11 ENCOUNTER — Encounter (INDEPENDENT_AMBULATORY_CARE_PROVIDER_SITE_OTHER): Payer: Self-pay

## 2018-10-12 ENCOUNTER — Encounter (INDEPENDENT_AMBULATORY_CARE_PROVIDER_SITE_OTHER): Payer: Self-pay | Admitting: Family Medicine

## 2018-10-12 ENCOUNTER — Ambulatory Visit (INDEPENDENT_AMBULATORY_CARE_PROVIDER_SITE_OTHER): Payer: Self-pay | Admitting: Dietician

## 2018-10-12 ENCOUNTER — Encounter (INDEPENDENT_AMBULATORY_CARE_PROVIDER_SITE_OTHER): Payer: Self-pay

## 2018-10-12 DIAGNOSIS — E8881 Metabolic syndrome: Secondary | ICD-10-CM | POA: Insufficient documentation

## 2018-10-12 DIAGNOSIS — Z6841 Body Mass Index (BMI) 40.0 and over, adult: Secondary | ICD-10-CM

## 2018-10-12 DIAGNOSIS — E88819 Insulin resistance, unspecified: Secondary | ICD-10-CM | POA: Insufficient documentation

## 2018-10-23 NOTE — Telephone Encounter (Signed)
Please review

## 2018-10-24 ENCOUNTER — Other Ambulatory Visit: Payer: Self-pay

## 2018-10-24 ENCOUNTER — Ambulatory Visit (INDEPENDENT_AMBULATORY_CARE_PROVIDER_SITE_OTHER): Payer: BLUE CROSS/BLUE SHIELD | Admitting: Family Medicine

## 2018-10-24 ENCOUNTER — Encounter (INDEPENDENT_AMBULATORY_CARE_PROVIDER_SITE_OTHER): Payer: Self-pay | Admitting: Family Medicine

## 2018-10-24 DIAGNOSIS — Z6841 Body Mass Index (BMI) 40.0 and over, adult: Secondary | ICD-10-CM

## 2018-10-24 DIAGNOSIS — E559 Vitamin D deficiency, unspecified: Secondary | ICD-10-CM | POA: Diagnosis not present

## 2018-10-24 NOTE — Progress Notes (Signed)
Office: [phone]  /  Fax: [phone] TeleHealth Visit:  Rebecca Dominguez has verbally consented to this TeleHealth visit today. The patient is located at home, the provider is located at the [facility] office. The participants in this visit include the listed provider and patient. The visit was conducted today via Face Time.  HPI:   Chief Complaint: OBESITY Rebecca Dominguez is here to discuss her progress with her obesity treatment plan. She is on the Category 3 plan and is following her eating plan approximately 98 % of the time. She states she is exercising 0 minutes 0 times per week. Tambi states that she continues to do well on the Category 3 plan. She thinks that she has maintained her weight. She is starting to feel deprived and is asking about "cheat meals".  We were unable to weigh the patient today for this TeleHealth visit. She feels as if she has maintained her weight since her last visit. She has lost 8 lbs since starting treatment with [place].  Vitamin D Deficiency Rebecca Dominguez has a diagnosis of vitamin D deficiency. She is currently stable on OTC vit D 1,000 IU per day, and her level is at goal. Rebecca Dominguez denies nausea, vomiting, or muscle weakness.  ASSESSMENT AND PLAN:  Vitamin D deficiency  Class 3 severe obesity with serious comorbidity and body mass index (BMI) of 50.0 to 59.9 in adult, unspecified obesity type (HCC)  PLAN:  Vitamin D Deficiency [name] was informed that low vitamin D levels contribute to fatigue and are associated with obesity, breast, and colon cancer. Rebecca Dominguez agrees to continue to take OTC Vit D and will follow up for routine testing of vitamin D, at least 2-3 times per year. She was informed of the risk of over-replacement of vitamin D and agrees to not increase her dose unless she discusses this with [place] first. We will recheck her labs in 2 months and Rebecca Dominguez agrees to follow up in 3 weeks as directed.  I spent > than 50% of  the 15 minute visit on counseling as documented in the note.  Obesity [name] is currently in the action stage of change. As such, her goal is to continue with weight loss efforts. She has agreed to follow the Category 3 plan. [name] is ok to rarely indulge for special occasions, but was still encouraged to be mindful about calories and nutrition with her choices. [name] has been instructed to work up to a goal of 150 minutes of combined cardio and strengthening exercise per week for weight loss and overall health benefits. We discussed the following Behavioral Modification Strategies today: increasing lean protein intake, emotional eating strategies, ways to avoid boredom eating, keeping healthy foods in the home, and avoiding night time snacking.  [name] has agreed to follow up with our clinic in 3 weeks. She was informed of the importance of frequent follow up visits to maximize her success with intensive lifestyle modifications for her multiple health conditions.  ALLERGIES: No Known Allergies  MEDICATIONS: Current Outpatient Medications on File Prior to Visit  Medication Sig Dispense Refill  . aspirin-acetaminophen-caffeine (EXCEDRIN MIGRAINE) 250-250-65 MG tablet Take 1 tablet by mouth every 6 (six) hours as needed for headache.    . cholecalciferol (VITAMIN D) 1000 units tablet Take 1,000 Units by mouth daily.    . cyanocobalamin 500 MCG tablet Take 500 mcg by mouth daily.    . ferrous sulfate 325 (65 FE) MG tablet Take 325 mg by mouth daily with breakfast.    .  Glucosamine-Chondroit-Vit C-Mn (GLUCOSAMINE CHONDR 1500 COMPLX) CAPS Take 1 capsule by mouth daily.    Marland Kitchen levonorgestrel (MIRENA) 20 MCG/24HR IUD 1 each by Intrauterine route once.    . Multiple Vitamins-Minerals (MULTIVITAMIN WOMEN) TABS Take 1 tablet by mouth daily.    . naproxen (NAPROSYN) 500 MG tablet Take 500 mg by mouth 2 (two) times daily with a meal.    . vitamin C (ASCORBIC ACID) 500 MG tablet Take 500 mg  by mouth daily.     No current facility-administered medications on file prior to visit.     PAST MEDICAL HISTORY: Past Medical History:  Diagnosis Date  . Anemia    low iron  . Back pain   . Foot pain    from previous fracture of toe 2010  . Gallbladder problem    removed  . Headache    occasional  . Hypertension    no longer on medications since weight loss  . Joint pain   . Obesity   . Plantar fasciitis   . Vitamin B 12 deficiency   . Vitamin D deficiency     PAST SURGICAL HISTORY: Past Surgical History:  Procedure Laterality Date  . CHOLECYSTECTOMY  2013  . COLONOSCOPY WITH PROPOFOL N/A 03/06/2016   Procedure: COLONOSCOPY WITH PROPOFOL;  Surgeon: Willis Modena, MD;  Location: [facility] ENDOSCOPY;  Service: Endoscopy;  Laterality: N/A;  . gastric bypass surgery  2001  . PILONIDAL CYST EXCISION    . WISDOM TOOTH EXTRACTION      SOCIAL HISTORY: Social History   Tobacco Use  . Smoking status: Never Smoker  . Smokeless tobacco: Never Used  Substance Use Topics  . Alcohol use: Yes    Comment: rare/special occasion  . Drug use: No    FAMILY HISTORY: Family History  Problem Relation Age of Onset  . Breast cancer Mother   . Obesity Mother   . Colon cancer Father   . Diabetes Father   . Obesity Father     ROS: Review of Systems  Gastrointestinal: Negative for nausea and vomiting.  Musculoskeletal:       Negative for muscle weakness.    PHYSICAL EXAM: Pt in no acute distress  RECENT LABS AND TESTS: BMET    Component Value Date/Time   NA 136 09/12/2018 0957   K 4.2 09/12/2018 0957   CL 100 09/12/2018 0957   CO2 21 09/12/2018 0957   GLUCOSE 79 09/12/2018 0957   BUN 16 09/12/2018 0957   CREATININE 0.71 09/12/2018 0957   CALCIUM 9.8 09/12/2018 0957   GFRNONAA 105 09/12/2018 0957   GFRAA 121 09/12/2018 0957   Lab Results  Component Value Date   HGBA1C 5.3 09/12/2018   Lab Results  Component Value Date   INSULIN 8.8 09/12/2018   CBC     Component Value Date/Time   WBC 6.8 09/12/2018 0957   RBC 4.71 09/12/2018 0957   HGB 12.6 09/12/2018 0957   HCT 38.5 09/12/2018 0957   MCV 82 09/12/2018 0957   MCH 26.8 09/12/2018 0957   MCHC 32.7 09/12/2018 0957   RDW 15.3 09/12/2018 0957   LYMPHSABS 1.7 09/12/2018 0957   EOSABS 0.2 09/12/2018 0957   BASOSABS 0.0 09/12/2018 0957   Iron/TIBC/Ferritin/ %Sat    Component Value Date/Time   IRON 47 09/12/2018 0957   TIBC 405 09/12/2018 0957   FERRITIN 17 09/12/2018 0957   IRONPCTSAT 12 (L) 09/12/2018 0957   Lipid Panel     Component Value Date/Time   CHOL 152 09/12/2018  0957   TRIG 48 09/12/2018 0957   HDL 63 09/12/2018 0957   LDLCALC 79 09/12/2018 0957   Hepatic Function Panel     Component Value Date/Time   PROT 6.9 09/12/2018 0957   ALBUMIN 4.4 09/12/2018 0957   AST 13 09/12/2018 0957   ALT 14 09/12/2018 0957   ALKPHOS 92 09/12/2018 0957   BILITOT 0.3 09/12/2018 0957      Component Value Date/Time   TSH 4.350 09/12/2018 0957   Results for MAKENZIE, SNELLGROVE (MRN [id number]) as of 10/24/2018 09:32  Ref. Range 09/12/2018 09:57  Vitamin D, 25-Hydroxy Latest Ref Range: 30.0 - 100.0 ng/mL 67.3     I, Kirke Corin, CMA, am acting as transcriptionist for Wilder Glade, MD I have reviewed the above documentation for accuracy and completeness, and I agree with the above. -Quillian Quince, MD

## 2018-10-26 ENCOUNTER — Ambulatory Visit (INDEPENDENT_AMBULATORY_CARE_PROVIDER_SITE_OTHER): Payer: BLUE CROSS/BLUE SHIELD | Admitting: Psychology

## 2018-10-26 ENCOUNTER — Encounter (INDEPENDENT_AMBULATORY_CARE_PROVIDER_SITE_OTHER): Payer: Self-pay

## 2018-10-26 ENCOUNTER — Other Ambulatory Visit: Payer: Self-pay

## 2018-10-26 DIAGNOSIS — F3289 Other specified depressive episodes: Secondary | ICD-10-CM | POA: Diagnosis not present

## 2018-10-26 NOTE — Progress Notes (Signed)
Office: [phone]  /  Fax: [phone]    Date: October 26, 2018   Appointment Start Time: 7:54am Duration: 37 minutes Provider: Lawerance Cruel, Psy.D. Type of Session: Individual Therapy  Location of Patient: Home office Location of Provider: Provider's home Type of Contact: Telepsychological Visit via [employer] Webex   Session Content:Prior to initiating telepsychological services, Rebecca Dominguez was provided with an informed consent document via e-mail, which included the development of a safety plan (I.e., an emergency contact and emergency resources) in the event of an emergency/crisis. Rebecca Dominguez returned the completed consent form prior to today's appointment via MyChart as this provider's clinic is closed. This provider verbally reviewed the consent form during today's appointment prior to proceeding with the appointment. Rebecca Dominguez verbally acknowledged understanding that she is ultimately responsible for understanding her insurance benefits as it relates to reimbursement of telepsychological services. This provider also reviewed confidentiality, as it relates to telepsychological services, as well as the rationale for telepsychological services. More specifically, this provider's clinic is closed for in-person visits due to COVID-19. Therapeutic services will resume to in-person appointments once the clinic re-opens. Rebecca Dominguez expressed understanding regarding the rationale for telepsychological services. In addition, this provider explained the telepsychological services informed consent document would be considered an addendum to the initial consent document. Rebecca Dominguez verbally consented to proceed. Regarding MyChart messages, Rebecca Dominguez verbally acknowledged understanding that any messages sent would be a part of her electronic medical record; therefore, visible to all providers.   Rebecca Dominguez is a 43 y.o. female presenting via [employer] Webex for a follow-up appointment to address the previously  established treatment goal of decreasing emotional eating. Prior to proceeding with today's appointment, Rebecca Dominguez's physical location at the time of this appointment was obtained. Rebecca Dominguez reported she was at home in her office, and provided the address. In the event of technical difficulties, Rebecca Dominguez shared a phone number she could be reached at. Rebecca Dominguez and this provider participated in today's telepsychological service. Also, Rebecca Dominguez denied anyone else being present in the room or on the [employer]. After discussing the consent form for telepsychological services, this provider verbally administered the PHQ-9 and GAD-7, and conducted a brief check-in. [name] shared her "day to day hasn't really changed," as she worked from home prior to COVID-19. The reduction in Rebecca Dominguez's GAD-7 score was reflected, and this provider explored with Rebecca Dominguez what she believes contributed to the reduction. Rebecca Dominguez shared "being at home" has helped, and she is "relieved" her husband is now able to work from home during the pandemic. However, Rebecca Dominguez reported some difficulty with social distancing due to COVID-19 and noted she experiences boredom. Thus, associated thoughts and feelings were processed. This provider recommended connecting with loved ones virtually, and also discussed the option of Netflix parties, as Rebecca Dominguez shared she misses being able to go to the movie theater. Rebecca Dominguez was agreeable to trying the aforementioned suggestions. Regarding sleep issues and fatigue endorsed on the PHQ-9, Rebecca Dominguez explained she has been "under the weather."   Regarding eating, Rebecca Dominguez shared, she is "sticking to the meal plan." She acknowledged boredom has triggered emotional hunger; however, she has not engaged in emotional eating since the last appointment with this provider. Thus, psychoeducation regarding pleasurable activities, including its impact on emotional eating and overall emotional well-being was  provided. Rebecca Dominguez was provided with a handout with various options of pleasurable activities via a MyChart message, and was encouraged to engage in one activity a day and additional activities when triggered to emotionally eat. Rebecca Dominguez agreed. Prior to sending the MyChart  message, this provider explained the message would be visible to all providers, as it would be part of the electronic medical record. Rebecca LeatherwoodKatherine verbally acknowledged understanding, and verbally consented to this provider sending the MyChart message. Moreover, Rebecca LeatherwoodKatherine shared a desire to go out to eat once concerns related to COVID-19 cease, but expressed concern about her ability to eat congruent to the meal plan. As such, previously discussed behavioral strategies for going out to eat were reviewed. Overall, Rebecca LeatherwoodKatherine was receptive to today's session as evidenced by openness to sharing, responsiveness to feedback, and willingness to engage in pleasurable activities.  Mental Status Examination:  Appearance: neat Behavior: cooperative Mood: euthymic Affect: mood congruent Speech: normal in rate, volume, and tone Eye Contact: appropriate Psychomotor Activity: appropriate Thought Process: linear, logical, and goal directed  Content/Perceptual Disturbances: denies suicidal and homicidal ideation, plan, and intent and no hallucinations, delusions, bizarre thinking or behavior reported or observed Orientation: time, person, place and purpose of appointment Cognition/Sensorium: memory, attention, language, and fund of knowledge intact  Insight: good Judgment: good  Structured Assessment Results: The Patient Health Questionnaire-9 (PHQ-9) is a self-report measure that assesses symptoms and severity of depression over the course of the last two weeks. Rebecca LeatherwoodKatherine obtained a score of 2 suggesting minimal depression. Rebecca LeatherwoodKatherine finds the endorsed symptoms to be not difficult at all. Depression screen [facility] 10/26/2018  Decreased Interest 0   Down, Depressed, Hopeless 0  PHQ - 2 Score 0  Altered sleeping 1  Tired, decreased energy 1  Change in appetite 0  Feeling bad or failure about yourself  0  Trouble concentrating 0  Moving slowly or fidgety/restless 0  Suicidal thoughts 0  PHQ-9 Score 2  Difficult doing work/chores -   The Generalized Anxiety Disorder-7 (GAD-7) is a brief self-report measure that assesses symptoms of anxiety over the course of the last two weeks. Rebecca LeatherwoodKatherine obtained a score of 1 suggesting minimal anxiety. GAD 7 : Generalized Anxiety Score 10/26/2018  Nervous, Anxious, on Edge 0  Control/stop worrying 0  Worry too much - different things 1  Trouble relaxing 0  Restless 0  Easily annoyed or irritable 0  Afraid - awful might happen 0  Total GAD 7 Score 1  Anxiety Difficulty Not difficult at all   Interventions:  Administration of PHQ-9 and GAD-7 for symptom monitoring Review of content from the previous session Empathic reflections and validation Processing thoughts and feelings regarding COVID-19/social distancing  Psychoeducation regarding pleasurable activities Positive reinforcement Brief chart review  DSM-5 Diagnosis: 311 (F32.8) Other Specified Depressive Disorder, Emotional Eating Behaviors  Treatment Goal & Progress: During the initial appointment with this provider, the following treatment goal was established: decrease emotional eating. Rebecca LeatherwoodKatherine has demonstrated progress in her goal as evidenced by increased awareness of hunger patterns and triggers for emotional eating. Despite experiencing emotional hunger, Rebecca LeatherwoodKatherine denied episodes of emotional eating since the last appointment with this provider.   Plan: Rebecca LeatherwoodKatherine continues to appear able and willing to participate as evidenced by engagement in reciprocal conversation, and asking questions for clarification as appropriate. The next appointment will be scheduled in two weeks, which will be via [employer] Web[employer]. Once this provider's office  resumes in-person appointments, Rebecca LeatherwoodKatherine will be notified. The next session will focus on reviewing pleasurable activities, and the introduction of mindfulness.

## 2018-11-09 NOTE — Progress Notes (Signed)
Office: [phone]  /  Fax: [phone]    Date: November 10, 2018  Appointment Start Time: 8:56am Duration: 34 minutes Provider: Lawerance Cruel, Psy.D. Type of Session: Individual Therapy  Location of Patient: Home Location of Provider: Home Type of Contact: Telepsychological Visit via [employer] WebEx   Session Content: Rebecca Dominguez is a 43 y.o. female presenting via [employer] WebEx for a follow-up appointment to address the previously established treatment goal of decreasing emotional eating. Today's appointment was a telepsychological visit, as this provider's clinic is closed for in-person visits due to COVID-19. Therapeutic services will resume to in-person appointments once the clinic re-opens. Hagen expressed understanding regarding the rationale for telepsychological services, and provided verbal consent for today's appointment. Prior to proceeding with today's appointment, Evanna's physical location at the time of this appointment was obtained. [name] reported she was at home and provided the address. In the event of technical difficulties, [name] shared a phone number she could be reached at. [name] and this provider participated in today's telepsychological service. Also, [name] denied anyone else being present in the room or on the WebEx appointment.   This provider verbally administered the PHQ-9 and GAD-7, and conducted a brief check-in. [name] shared nothing has changed since the last appointment, and she explained she only goes out for groceries. She also shared about recent family interactions. [name] noted, "As much as I don't mind being at home, it is nice to go to the movies or see my friends." She discussed feeling restless due to boredom in the past couple of weeks, and noted she is trying to start house projects and walk. Regarding eating, [name] shared, "It's good. My weight has been hovering in the same four pound range, up an down." She discussed eating  congruent to the structured meal plan, but discussed some deviations. Nevertheless, [name] denied episodes of emotional eating since the last appointment despite experiencing emotional hunger. Psychoeducation regarding mindfulness was provided. A handout was provided via a MyChart message to [name] with further information regarding mindfulness, including exercises. This provider also explained the benefit of mindfulness as it relates to emotional eating and overall well-being. [name] was encouraged to engage in the provided exercises between now and the next appointment with this provider. Rebecca Leatherwood agreed. Prior to sending the MyChart message, this provider explained the message would be visible to all providers, as it would be part of the electronic medical record. [name] verbally acknowledged understanding, and verbally consented to this provider sending the MyChart message. Furthermore, this provider and [name] discussed termination planning. [name] was agreeable to meeting for additional appointments if deemed appropriate in addition to the initially planned duration of treatment at the onset of therapeutic services. She was also understanding of double checking her insurance benefits for mental health services. [name] was receptive to today's session as evidenced by openness to sharing, responsiveness to feedback, and willingness to engage in mindfulness exercises.  Mental Status Examination:  Appearance: neat Behavior: cooperative Mood: euthymic Affect: mood congruent Speech: normal in rate, volume, and tone Eye Contact: appropriate Psychomotor Activity: appropriate Thought Process: linear, logical, and goal directed  Content/Perceptual Disturbances: denies suicidal and homicidal ideation, plan, and intent and no hallucinations, delusions, bizarre thinking or behavior reported or observed Orientation: time, person, place and purpose of appointment Cognition/Sensorium:  memory, attention, language, and fund of knowledge intact  Insight: good Judgment: good  Structured Assessment Results: The Patient Health Questionnaire-9 (PHQ-9) is a self-report measure that assesses symptoms and severity of depression over the course of the last  two weeks. Rebecca Dominguez obtained a score of 1 suggesting minimal depression. Rebecca Dominguez finds the endorsed symptoms to be not difficult at all. Depression screen [facility] 2/9 11/10/2018  Decreased Interest 0  Down, Depressed, Hopeless 0  PHQ - 2 Score 0  Altered sleeping 0  Tired, decreased energy 0  Change in appetite 0  Feeling bad or failure about yourself  0  Trouble concentrating 0  Moving slowly or fidgety/restless 1  Suicidal thoughts 0  PHQ-9 Score 1  Difficult doing work/chores -   The Generalized Anxiety Disorder-7 (GAD-7) is a brief self-report measure that assesses symptoms of anxiety over the course of the last two weeks. Rebecca Dominguez obtained a score of 0. GAD 7 : Generalized Anxiety Score 11/10/2018  Nervous, Anxious, on Edge 0  Control/stop worrying 0  Worry too much - different things 0  Trouble relaxing 0  Restless 0  Easily annoyed or irritable 0  Afraid - awful might happen 0  Total GAD 7 Score 0  Anxiety Difficulty -   Interventions:  Administration of PHQ-9 and GAD-7 for symptom monitoring Empathic reflections and validation Psychoeducation regarding mindfulness Termination planning Positive reinforcement Brief chart review Employed supportive psychotherapy interventions today to facilitate reduced distress, and to improve coping skills with identified stressors  DSM-5 Diagnosis: 311 (F32.8) Other Specified Depressive Disorder, Emotional Eating Behaviors  Treatment Goal & Progress: During the initial appointment with this provider, the following treatment goal was established: decrease emotional eating. Rebecca Dominguez has demonstrated progress in her goal as evidenced by increased awareness of hunger patterns  and triggers for emotional eating. She also denied episodes of emotional eating since the last appointment with this provider despite experiencing emotional hunger. Rebecca Dominguez continues to demonstrate willingness to engage in learned skills.   Plan: Rebecca Dominguez continues to appear able and willing to participate as evidenced by engagement in reciprocal conversation, and asking questions for clarification as appropriate. The next appointment will be scheduled in two weeks, which will be via [employer] Web[employer]. Once this provider's office resumes in-person appointments, Rebecca Dominguez will be notified. The next session will focus further on mindfulness.

## 2018-11-10 ENCOUNTER — Ambulatory Visit (INDEPENDENT_AMBULATORY_CARE_PROVIDER_SITE_OTHER): Payer: BLUE CROSS/BLUE SHIELD | Admitting: Psychology

## 2018-11-10 ENCOUNTER — Other Ambulatory Visit: Payer: Self-pay

## 2018-11-10 ENCOUNTER — Encounter (INDEPENDENT_AMBULATORY_CARE_PROVIDER_SITE_OTHER): Payer: Self-pay

## 2018-11-10 DIAGNOSIS — F3289 Other specified depressive episodes: Secondary | ICD-10-CM | POA: Diagnosis not present

## 2018-11-15 ENCOUNTER — Other Ambulatory Visit: Payer: Self-pay

## 2018-11-15 ENCOUNTER — Ambulatory Visit (INDEPENDENT_AMBULATORY_CARE_PROVIDER_SITE_OTHER): Payer: BLUE CROSS/BLUE SHIELD | Admitting: Family Medicine

## 2018-11-15 ENCOUNTER — Encounter (INDEPENDENT_AMBULATORY_CARE_PROVIDER_SITE_OTHER): Payer: Self-pay | Admitting: Family Medicine

## 2018-11-15 DIAGNOSIS — E559 Vitamin D deficiency, unspecified: Secondary | ICD-10-CM | POA: Diagnosis not present

## 2018-11-15 DIAGNOSIS — Z6841 Body Mass Index (BMI) 40.0 and over, adult: Secondary | ICD-10-CM | POA: Diagnosis not present

## 2018-11-15 NOTE — Progress Notes (Signed)
Office: [phone]  /  Fax: [phone] TeleHealth Visit:  Rebecca Dominguez has verbally consented to this TeleHealth visit today. The patient is located at home, the provider is located at the [facility] office. The participants in this visit include the listed provider and patient. The visit was conducted today via Face Time.  HPI:   Chief Complaint: OBESITY Rebecca Dominguez is here to discuss her progress with her obesity treatment plan. She is on the Category 3 plan and is following her eating plan approximately 95 to 98 % of the time. She states she is walking 60 minutes 2 times per week. Rebecca Dominguez feels that she is doing well with weight loss on her Category 3 plan. She is especially getting bored with dinner. She states her hunger is controlled, but she notes an increase in snacking while in isolation.  We were unable to weigh the patient today for this TeleHealth visit. She feels as if she has lost weight since her last visit. She has lost 8 lbs since starting treatment with [place].  Vitamin D Deficiency Rebecca Dominguez has a diagnosis of vitamin D deficiency. She is currently on OTC vit D and her last level was at goal of 67.3 on 09/12/18. Rebecca Dominguez denies nausea, vomiting, or muscle weakness.  ASSESSMENT AND PLAN:  Vitamin D deficiency  Class 3 severe obesity with serious comorbidity and body mass index (BMI) of 50.0 to 59.9 in adult, unspecified obesity type (HCC)  PLAN:  Vitamin D Deficiency Rebecca Dominguez was informed that low vitamin D levels contribute to fatigue and are associated with obesity, breast, and colon cancer. Rebecca Dominguez agrees to continue to take OTC Vit D and will follow up for routine testing of vitamin D, at least 2-3 times per year. She was informed of the risk of over-replacement of vitamin D and agrees to not increase her dose unless she discusses this with [place] first. We will recheck labs in 2 months and Rebecca Dominguez agrees to follow up in 2 weeks as directed.   Obesity Rebecca Dominguez is currently in the action stage of change. As such, her goal is to continue with weight loss efforts. She has agreed to follow the Category 3 plan and keep a food journal with 450 to 650 calories and 40+ grams of protein for supper.  Cristi has been instructed to work up to a goal of 150 minutes of combined cardio and strengthening exercise per week for weight loss and overall health benefits. We discussed the following Behavioral Modification Strategies today: increasing lean protein intake, decreasing simple carbohydrates, emotional eating strategies, better snacking choices, and ways to avoid night time snacking.  Chelsae has agreed to follow up with our clinic in 2 weeks. She was informed of the importance of frequent follow up visits to maximize her success with intensive lifestyle modifications for her multiple health conditions.  ALLERGIES: No Known Allergies  MEDICATIONS: Current Outpatient Medications on File Prior to Visit  Medication Sig Dispense Refill  . aspirin-acetaminophen-caffeine (EXCEDRIN MIGRAINE) 250-250-65 MG tablet Take 1 tablet by mouth every 6 (six) hours as needed for headache.    . cholecalciferol (VITAMIN D) 1000 units tablet Take 1,000 Units by mouth daily.    . cyanocobalamin 500 MCG tablet Take 500 mcg by mouth daily.    . ferrous sulfate 325 (65 FE) MG tablet Take 325 mg by mouth daily with breakfast.    . Glucosamine-Chondroit-Vit C-Mn (GLUCOSAMINE CHONDR 1500 COMPLX) CAPS Take 1 capsule by mouth daily.    Marland Kitchen levonorgestrel (MIRENA)  20 MCG/24HR IUD 1 each by Intrauterine route once.    . Multiple Vitamins-Minerals (MULTIVITAMIN WOMEN) TABS Take 1 tablet by mouth daily.    . naproxen (NAPROSYN) 500 MG tablet Take 500 mg by mouth 2 (two) times daily with a meal.    . vitamin C (ASCORBIC ACID) 500 MG tablet Take 500 mg by mouth daily.     No current facility-administered medications on file prior to visit.     PAST MEDICAL HISTORY:  Past Medical History:  Diagnosis Date  . Anemia    low iron  . Back pain   . Foot pain    from previous fracture of toe 2010  . Gallbladder problem    removed  . Headache    occasional  . Hypertension    no longer on medications since weight loss  . Joint pain   . Obesity   . Plantar fasciitis   . Vitamin B 12 deficiency   . Vitamin D deficiency     PAST SURGICAL HISTORY: Past Surgical History:  Procedure Laterality Date  . CHOLECYSTECTOMY  2013  . COLONOSCOPY WITH PROPOFOL N/A 03/06/2016   Procedure: COLONOSCOPY WITH PROPOFOL;  Surgeon: Willis ModenaWilliam Outlaw, MD;  [facility];  Service: Endoscopy;  Laterality: N/A;  . gastric bypass surgery  2001  . PILONIDAL CYST EXCISION    . WISDOM TOOTH EXTRACTION      SOCIAL HISTORY: Social History   Tobacco Use  . Smoking status: Never Smoker  . Smokeless tobacco: Never Used  Substance Use Topics  . Alcohol use: Yes    Comment: rare/special occasion  . Drug use: No    FAMILY HISTORY: Family History  Problem Relation Age of Onset  . Breast cancer Mother   . Obesity Mother   . Colon cancer Father   . Diabetes Father   . Obesity Father     ROS: Review of Systems  Gastrointestinal: Negative for nausea and vomiting.  Musculoskeletal:       Negative for muscle weakness.    PHYSICAL EXAM: Pt in no acute distress  RECENT LABS AND TESTS: BMET    Component Value Date/Time   NA 136 09/12/2018 0957   K 4.2 09/12/2018 0957   CL 100 09/12/2018 0957   CO2 21 09/12/2018 0957   GLUCOSE 79 09/12/2018 0957   BUN 16 09/12/2018 0957   CREATININE 0.71 09/12/2018 0957   CALCIUM 9.8 09/12/2018 0957   GFRNONAA 105 09/12/2018 0957   GFRAA 121 09/12/2018 0957   Lab Results  Component Value Date   HGBA1C 5.3 09/12/2018   Lab Results  Component Value Date   INSULIN 8.8 09/12/2018   CBC    Component Value Date/Time   WBC 6.8 09/12/2018 0957   RBC 4.71 09/12/2018 0957   HGB 12.6 09/12/2018 0957   HCT 38.5  09/12/2018 0957   MCV 82 09/12/2018 0957   MCH 26.8 09/12/2018 0957   MCHC 32.7 09/12/2018 0957   RDW 15.3 09/12/2018 0957   LYMPHSABS 1.7 09/12/2018 0957   EOSABS 0.2 09/12/2018 0957   BASOSABS 0.0 09/12/2018 0957   Iron/TIBC/Ferritin/ %Sat    Component Value Date/Time   IRON 47 09/12/2018 0957   TIBC 405 09/12/2018 0957   FERRITIN 17 09/12/2018 0957   IRONPCTSAT 12 (L) 09/12/2018 0957   Lipid Panel     Component Value Date/Time   CHOL 152 09/12/2018 0957   TRIG 48 09/12/2018 0957   HDL 63 09/12/2018 0957   LDLCALC 79 09/12/2018 0957  Hepatic Function Panel     Component Value Date/Time   PROT 6.9 09/12/2018 0957   ALBUMIN 4.4 09/12/2018 0957   AST 13 09/12/2018 0957   ALT 14 09/12/2018 0957   ALKPHOS 92 09/12/2018 0957   BILITOT 0.3 09/12/2018 0957      Component Value Date/Time   TSH 4.350 09/12/2018 0957   Results for ALEILA, HODGINS (MRN [id number]) as of 11/15/2018 10:11  Ref. Range 09/12/2018 09:57  Vitamin D, 25-Hydroxy Latest Ref Range: 30.0 - 100.0 ng/mL 67.3     I, Kirke Corin, CMA, am acting as transcriptionist for Wilder Glade, MD I have reviewed the above documentation for accuracy and completeness, and I agree with the above. -Quillian Quince, MD

## 2018-11-21 ENCOUNTER — Ambulatory Visit (INDEPENDENT_AMBULATORY_CARE_PROVIDER_SITE_OTHER): Payer: BLUE CROSS/BLUE SHIELD | Admitting: Psychology

## 2018-11-21 ENCOUNTER — Other Ambulatory Visit: Payer: Self-pay

## 2018-11-21 DIAGNOSIS — F3289 Other specified depressive episodes: Secondary | ICD-10-CM | POA: Diagnosis not present

## 2018-11-21 NOTE — Progress Notes (Signed)
Office: [phone]  /  Fax: [phone]    Date: Nov 21, 2018 Appointment Start Time: 8:56am Duration: 34 minutes Provider: Lawerance Cruel, Psy.D. Type of Session: Individual Therapy  Location of Patient: Home Location of Provider: Provider's Home Type of Contact: Telepsychological Visit via [employer] Web[employer]   Session Content: Rebecca Dominguez is a 43 y.o. female presenting via [employer] Web[employer] for a follow-up appointment to address the previously established treatment goal of decreasing emotional eating. Today's appointment was a telepsychological visit, as this provider's clinic is closed for in-person visits due to COVID-19. Therapeutic services will resume to in-person appointments once the clinic re-opens. Rebecca Dominguez expressed understanding regarding the rationale for telepsychological services, and provided verbal consent for today's appointment. Prior to proceeding with today's appointment, Rebecca Dominguez's physical location at the time of this appointment was obtained. Rebecca Dominguez reported she was at home and provided the address. In the event of technical difficulties, Rebecca Dominguez shared a phone number she could be reached at. Rebecca Dominguez and this provider participated in today's telepsychological service. Also, Rebecca Dominguez denied anyone else being present in the room or on the WebEx appointment.  This provider conducted a brief check-in and verbally administered the PHQ-9 and GAD-7. Rebecca Dominguez shared she "took a couple days off" and completed painting her kitchen. She discussed experiencing soreness secondary to the painting project, which has impacted her sleep in the past two nights. Additionally, Rebecca Dominguez discussed concern related her mother's well-being and her desire for her mother to complete paperwork for medical decisions and financial planning. She was observed becoming tearful while sharing about the aforementioned. Thus, part of today's appointment focused on processing associated thoughts and feelings.  Remainder of session focused on mindfulness. Rebecca Dominguez shared she has been practicing, and she stated, "It kind of makes me focus or be more aware of what I'm doing." She discussed mindfulness has helped with "the decision making of whether or not to eat something." As such, she denied episodes of emotional eating since the last appointment with this provider. Moreover, this provider discussed informal (e.g., being in the present moment while engaging in a day-to-day activity) versus formal (e.g., setting aside time for a specific exercise) mindfulness practice, as well as the utilization of YouTube for mindfulness exercises. To further assist with her mindfulness practice this provider discussed journaling her experience. Rebecca Dominguez was receptive, and a log was sent to her via e-mail. Rebecca Dominguez was receptive to today's session as evidenced by openness to sharing, responsiveness to feedback, and willingness to continue engaging in mindfulness.  Mental Status Examination:  Appearance: neat Behavior: cooperative Mood: euthymic Affect: mood congruent Speech: normal in rate, volume, and tone Eye Contact: appropriate Psychomotor Activity: appropriate Thought Process: linear, logical, and goal directed  Content/Perceptual Disturbances: denies suicidal and homicidal ideation, plan, and intent and no hallucinations, delusions, bizarre thinking or behavior reported or observed Orientation: time, person, place and purpose of appointment Cognition/Sensorium: memory, attention, language, and fund of knowledge intact  Insight: good Judgment: good  Structured Assessment Results: The Patient Health Questionnaire-9 (PHQ-9) is a self-report measure that assesses symptoms and severity of depression over the course of the last two weeks. Rebecca Dominguez obtained a score of 0. Depression screen [facility] 2/9 11/21/2018  Decreased Interest 0  Down, Depressed, Hopeless 0  PHQ - 2 Score 0  Altered sleeping 0  Tired, decreased  energy 0  Change in appetite 0  Feeling bad or failure about yourself  0  Trouble concentrating 0  Moving slowly or fidgety/restless 0  Suicidal thoughts 0  PHQ-9 Score  0  Difficult doing work/chores -   The Generalized Anxiety Disorder-7 (GAD-7) is a brief self-report measure that assesses symptoms of anxiety over the course of the last two weeks. Rebecca Dominguez obtained a score of 3 suggesting minimal anxiety. GAD 7 : Generalized Anxiety Score 11/21/2018  Nervous, Anxious, on Edge 0  Control/stop worrying 2  Worry too much - different things 0  Trouble relaxing 0  Restless 0  Easily annoyed or irritable 0  Afraid - awful might happen 1  Total GAD 7 Score 3  Anxiety Difficulty Not difficult at all   Interventions:  Administered PHQ-9 and GAD-7 for symptom monitoring Reviewed content from the previous session Provided empathic reflections and validation Processed thoughts and feelings Provided positive reinforcement Employed supportive psychotherapy interventions today to facilitate reduced distress, and to improve coping skills with identified stressors Conducted a brief chart review  DSM-5 Diagnosis: 311 (F32.8) Other Specified Depressive Disorder, Emotional Eating Behaviors  Treatment Goal & Progress: During the initial appointment with this provider, the following treatment goal was established: decrease emotional eating. Rebecca Dominguez has demonstrated progress in her goal as evidenced by increased awareness of hunger patterns and triggers for emotional eating. Rebecca Dominguez continues to demonstrate willingness to engage in learned skills, and she also denied episodes of emotional eating since the last appointment with this provider.   Plan: Rebecca Dominguez continues to appear able and willing to participate as evidenced by engagement in reciprocal conversation, and asking questions for clarification as appropriate. The next appointment will be scheduled in two weeks, which will be via [employer] Web[employer].  Once this provider's office resumes in-person appointments, Rebecca Dominguez will be notified. The next session will focus further on mindfulness.

## 2018-11-30 ENCOUNTER — Encounter (INDEPENDENT_AMBULATORY_CARE_PROVIDER_SITE_OTHER): Payer: Self-pay | Admitting: Family Medicine

## 2018-11-30 ENCOUNTER — Other Ambulatory Visit: Payer: Self-pay

## 2018-11-30 ENCOUNTER — Ambulatory Visit (INDEPENDENT_AMBULATORY_CARE_PROVIDER_SITE_OTHER): Payer: BLUE CROSS/BLUE SHIELD | Admitting: Family Medicine

## 2018-11-30 DIAGNOSIS — M79675 Pain in left toe(s): Secondary | ICD-10-CM

## 2018-11-30 DIAGNOSIS — Z6841 Body Mass Index (BMI) 40.0 and over, adult: Secondary | ICD-10-CM | POA: Diagnosis not present

## 2018-12-01 NOTE — Progress Notes (Signed)
Office: [phone]  /  Fax: [phone] TeleHealth Visit:  Rebecca Dominguez has verbally consented to this TeleHealth visit today. The patient is located at home, the provider is located at the [facility] office. The participants in this visit include the listed provider and patient. The visit was conducted today via Face Time.  HPI:   Chief Complaint: OBESITY Rebecca Dominguez is here to discuss her progress with her obesity treatment plan. She is on the Category 3 plan and keeping a food journal of 450 to 600 calories and 40+ grams of protein for supper and is following her eating plan approximately 95 % of the time. She states she has been working on home projects. Rebecca Dominguez continues to do well with weight loss on her eating plan. She is doing a lot of home improvement projects and is being more active. Rebecca Dominguez is doing well with her diet prescription and has lost another 2 to 3 pounds.   We were unable to weigh the patient today for this TeleHealth visit. She feels as if she has lost weight since her last visit. She has lost 8 lbs since starting treatment with us.  Left 5th Toe Pain Rebecca Dominguez thinks that she broke her pinky toe. She has been on her feet more and notes increased pain. She requests a referral to have this evaluated.  ASSESSMENT AND PLAN:  Pain in toe of left foot - Plan: Ambulatory referral to Podiatry  Class 3 severe obesity with serious comorbidity and body mass index (BMI) of 50.0 to 59.9 in adult, unspecified obesity type (HCC)  PLAN:  Left 5th Toe Pain We will refer Rebecca Dominguez to Dr. Ardelle AntonWagoner and she will follow up with us in 2 weeks as directed.  I spent > than 50% of the 25 minute visit on counseling as documented in the note.  Obesity Rebecca Dominguez is currently in the action stage of change. As such, her goal is to continue with weight loss efforts. She has agreed to follow the Category 3 plan and keep a food journal with 450 to 650 calories and  40+ grams of protein for supper.  Rebecca Dominguez has been instructed to work up to a goal of 150 minutes of combined cardio and strengthening exercise per week for weight loss and overall health benefits. We discussed the following Behavioral Modification Strategies today: work on meal planning and easy cooking plans, keeping healthy foods in the home, and better snacking choices.  Rebecca Dominguez has agreed to follow up with our clinic in 2 weeks. She was informed of the importance of frequent follow up visits to maximize her success with intensive lifestyle modifications for her multiple health conditions.  ALLERGIES: No Known Allergies  MEDICATIONS: Current Outpatient Medications on File Prior to Visit  Medication Sig Dispense Refill  . aspirin-acetaminophen-caffeine (EXCEDRIN MIGRAINE) 250-250-65 MG tablet Take 1 tablet by mouth every 6 (six) hours as needed for headache.    . cholecalciferol (VITAMIN D) 1000 units tablet Take 1,000 Units by mouth daily.    . cyanocobalamin 500 MCG tablet Take 500 mcg by mouth daily.    . ferrous sulfate 325 (65 FE) MG tablet Take 325 mg by mouth daily with breakfast.    . Glucosamine-Chondroit-Vit C-Mn (GLUCOSAMINE CHONDR 1500 COMPLX) CAPS Take 1 capsule by mouth daily.    Marland Kitchen. levonorgestrel (MIRENA) 20 MCG/24HR IUD 1 each by Intrauterine route once.    . Multiple Vitamins-Minerals (MULTIVITAMIN WOMEN) TABS Take 1 tablet by mouth daily.    . naproxen (NAPROSYN)  500 MG tablet Take 500 mg by mouth 2 (two) times daily with a meal.    . vitamin C (ASCORBIC ACID) 500 MG tablet Take 500 mg by mouth daily.     No current facility-administered medications on file prior to visit.     PAST MEDICAL HISTORY: Past Medical History:  Diagnosis Date  . Anemia    low iron  . Back pain   . Foot pain    from previous fracture of toe 2010  . Gallbladder problem    removed  . Headache    occasional  . Hypertension    no longer on medications since weight loss  . Joint pain    . Obesity   . Plantar fasciitis   . Vitamin B 12 deficiency   . Vitamin D deficiency     PAST SURGICAL HISTORY: Past Surgical History:  Procedure Laterality Date  . CHOLECYSTECTOMY  2013  . COLONOSCOPY WITH PROPOFOL N/A 03/06/2016   Procedure: COLONOSCOPY WITH PROPOFOL;  Surgeon: Willis Modena, MD;  Location: [facility] ENDOSCOPY;  Service: Endoscopy;  Laterality: N/A;  . gastric bypass surgery  2001  . PILONIDAL CYST EXCISION    . WISDOM TOOTH EXTRACTION      SOCIAL HISTORY: Social History   Tobacco Use  . Smoking status: Never Smoker  . Smokeless tobacco: Never Used  Substance Use Topics  . Alcohol use: Yes    Comment: rare/special occasion  . Drug use: No    FAMILY HISTORY: Family History  Problem Relation Age of Onset  . Breast cancer Mother   . Obesity Mother   . Colon cancer Father   . Diabetes Father   . Obesity Father     ROS: Review of Systems  Musculoskeletal:       Positive for toe pain.    PHYSICAL EXAM: Pt in no acute distress  RECENT LABS AND TESTS: BMET    Component Value Date/Time   NA 136 09/12/2018 0957   K 4.2 09/12/2018 0957   CL 100 09/12/2018 0957   CO2 21 09/12/2018 0957   GLUCOSE 79 09/12/2018 0957   BUN 16 09/12/2018 0957   CREATININE 0.71 09/12/2018 0957   CALCIUM 9.8 09/12/2018 0957   GFRNONAA 105 09/12/2018 0957   GFRAA 121 09/12/2018 0957   Lab Results  Component Value Date   HGBA1C 5.3 09/12/2018   Lab Results  Component Value Date   INSULIN 8.8 09/12/2018   CBC    Component Value Date/Time   WBC 6.8 09/12/2018 0957   RBC 4.71 09/12/2018 0957   HGB 12.6 09/12/2018 0957   HCT 38.5 09/12/2018 0957   MCV 82 09/12/2018 0957   MCH 26.8 09/12/2018 0957   MCHC 32.7 09/12/2018 0957   RDW 15.3 09/12/2018 0957   LYMPHSABS 1.7 09/12/2018 0957   EOSABS 0.2 09/12/2018 0957   BASOSABS 0.0 09/12/2018 0957   Iron/TIBC/Ferritin/ %Sat    Component Value Date/Time   IRON 47 09/12/2018 0957   TIBC 405 09/12/2018 0957    FERRITIN 17 09/12/2018 0957   IRONPCTSAT 12 (L) 09/12/2018 0957   Lipid Panel     Component Value Date/Time   CHOL 152 09/12/2018 0957   TRIG 48 09/12/2018 0957   HDL 63 09/12/2018 0957   LDLCALC 79 09/12/2018 0957   Hepatic Function Panel     Component Value Date/Time   PROT 6.9 09/12/2018 0957   ALBUMIN 4.4 09/12/2018 0957   AST 13 09/12/2018 0957   ALT 14 09/12/2018 0957  ALKPHOS 92 09/12/2018 0957   BILITOT 0.3 09/12/2018 0957      Component Value Date/Time   TSH 4.350 09/12/2018 0957   Results for FAREEDAH, LOFTIN (MRN [id number]) as of 12/01/2018 06:57  Ref. Range 09/12/2018 09:57  Vitamin D, 25-Hydroxy Latest Ref Range: 30.0 - 100.0 ng/mL 67.3     I, Kirke Corin, CMA, am acting as transcriptionist for Wilder Glade, MD I have reviewed the above documentation for accuracy and completeness, and I agree with the above. -Quillian Quince, MD

## 2018-12-02 ENCOUNTER — Other Ambulatory Visit: Payer: Self-pay

## 2018-12-02 ENCOUNTER — Encounter: Payer: Self-pay | Admitting: Podiatry

## 2018-12-02 ENCOUNTER — Other Ambulatory Visit: Payer: Self-pay | Admitting: Podiatry

## 2018-12-02 ENCOUNTER — Ambulatory Visit (INDEPENDENT_AMBULATORY_CARE_PROVIDER_SITE_OTHER): Payer: BLUE CROSS/BLUE SHIELD

## 2018-12-02 ENCOUNTER — Ambulatory Visit (INDEPENDENT_AMBULATORY_CARE_PROVIDER_SITE_OTHER): Payer: BLUE CROSS/BLUE SHIELD | Admitting: Podiatry

## 2018-12-02 VITALS — BP 122/71 | HR 77 | Temp 97.7°F | Resp 16 | Ht 65.0 in | Wt 322.0 lb

## 2018-12-02 DIAGNOSIS — M19072 Primary osteoarthritis, left ankle and foot: Secondary | ICD-10-CM | POA: Diagnosis not present

## 2018-12-02 DIAGNOSIS — M19079 Primary osteoarthritis, unspecified ankle and foot: Secondary | ICD-10-CM

## 2018-12-02 DIAGNOSIS — M722 Plantar fascial fibromatosis: Secondary | ICD-10-CM | POA: Diagnosis not present

## 2018-12-02 DIAGNOSIS — M7750 Other enthesopathy of unspecified foot: Secondary | ICD-10-CM

## 2018-12-02 DIAGNOSIS — M79675 Pain in left toe(s): Secondary | ICD-10-CM

## 2018-12-02 MED ORDER — MELOXICAM 15 MG PO TABS: 15.0000 mg | ORAL_TABLET | Freq: Every day | ORAL | 0 refills | Status: DC

## 2018-12-02 NOTE — Progress Notes (Signed)
Subjective:    Patient ID: Rebecca Dominguez, female    DOB: 04-19-1976, 43 y.o.   MRN: [id number]  HPI 43 year old female presents the office today for concerns of left foot pain.  She states that about 10 years ago she thinks she broke her fifth toe.  Sugar treatment after evaluation significant swelling present of the fifth toe.  Over the last 10 years she started developed pain more to the outside aspect of her feet and she is not sure of the fracture of her heel correctly.  She states that the pain is better when she wears more supportive shoes.  She states purchasing over-the-counter inserts from the good feet store which is been somewhat helpful.  She said it hurts more when wearing bedroom slipper.  She also has a history of plantar fasciitis.  She has no other concerns.   Review of Systems  All other systems reviewed and are negative.  Past Medical History:  Diagnosis Date  . Anemia    low iron  . Back pain   . Foot pain    from previous fracture of toe 2010  . Gallbladder problem    removed  . Headache    occasional  . Hypertension    no longer on medications since weight loss  . Joint pain   . Obesity   . Plantar fasciitis   . Vitamin B 12 deficiency   . Vitamin D deficiency     Past Surgical History:  Procedure Laterality Date  . CHOLECYSTECTOMY  2013  . COLONOSCOPY WITH PROPOFOL N/A 03/06/2016   Procedure: COLONOSCOPY WITH PROPOFOL;  Surgeon: Willis ModenaWilliam Outlaw, MD;  [facility];  Service: Endoscopy;  Laterality: N/A;  . gastric bypass surgery  2001  . PILONIDAL CYST EXCISION    . WISDOM TOOTH EXTRACTION       Current Outpatient Medications:  .  aspirin-acetaminophen-caffeine (EXCEDRIN MIGRAINE) 250-250-65 MG tablet, Take 1 tablet by mouth every 6 (six) hours as needed for headache., Disp: , Rfl:  .  cholecalciferol (VITAMIN D) 1000 units tablet, Take 1,000 Units by mouth daily., Disp: , Rfl:  .  cyanocobalamin 500 MCG tablet, Take 500 mcg by mouth  daily., Disp: , Rfl:  .  ferrous sulfate 325 (65 FE) MG tablet, Take 325 mg by mouth daily with breakfast., Disp: , Rfl:  .  Glucosamine-Chondroit-Vit C-Mn (GLUCOSAMINE CHONDR 1500 COMPLX) CAPS, Take 1 capsule by mouth daily., Disp: , Rfl:  .  levonorgestrel (MIRENA) 20 MCG/24HR IUD, 1 each by Intrauterine route once., Disp: , Rfl:  .  Multiple Vitamins-Minerals (MULTIVITAMIN WOMEN) TABS, Take 1 tablet by mouth daily., Disp: , Rfl:  .  naproxen (NAPROSYN) 500 MG tablet, Take 500 mg by mouth 2 (two) times daily with a meal., Disp: , Rfl:  .  vitamin C (ASCORBIC ACID) 500 MG tablet, Take 500 mg by mouth daily., Disp: , Rfl:  .  meloxicam (MOBIC) 15 MG tablet, Take 1 tablet (15 mg total) by mouth daily., Disp: 30 tablet, Rfl: 0  No Known Allergies      Objective:   Physical Exam General: AAO x3, NAD, obese   Dermatological: Skin is warm, dry and supple bilateral. Nails x 10 are well manicured; remaining integument appears unremarkable at this time. There are no open sores, no preulcerative lesions, no rash or signs of infection present.  Vascular: Dorsalis Pedis artery and Posterior Tibial artery pedal pulses are 2/4 bilateral with immedate capillary fill time. Pedal hair growth present. No varicosities  and no lower extremity edema present bilateral. There is no pain with calf compression, swelling, warmth, erythema.   Neruologic: Grossly intact via light touch bilateral. Vibratory intact via tuning fork bilateral. Protective threshold with Semmes Wienstein monofilament intact to all pedal sites bilateral. Negative tinel sign bilaterally.   Musculoskeletal: There is no specific area pinpoint mild tenderness pain vibratory sensation identified today there is no edema, erythema.  Subjectively she is getting discomfort on the third metatarsal base, fifth metatarsal head on the fourth, fifth metatarsal cuboid joints.  Upon weightbearing evaluation there is decrease in medial arch height.  She is also  got good.  Gait: Unassisted, Nonantalgic.      Assessment & Plan:  43 year old female with left foot tendinitis, mild osteoarthritis -Treatment options discussed including all alternatives, risks, and complications -Etiology of symptoms were discussed -X-rays were obtained and reviewed with the patient. There is no definitive evidence of acute fractures in the right tibia.  No deformity. -I do think a lot of her symptoms are biomechanical in nature.  We discussed more custom orthotics or check orthotic coverage for her.  Monitor follow-up with Raiford Noble.  I prescribed meloxicam discussed side effects medication.  Discussed supportive shoes.  Vivi Barrack DPM

## 2018-12-05 ENCOUNTER — Ambulatory Visit (INDEPENDENT_AMBULATORY_CARE_PROVIDER_SITE_OTHER): Payer: BLUE CROSS/BLUE SHIELD | Admitting: Psychology

## 2018-12-05 ENCOUNTER — Other Ambulatory Visit: Payer: Self-pay

## 2018-12-05 DIAGNOSIS — F3289 Other specified depressive episodes: Secondary | ICD-10-CM

## 2018-12-05 NOTE — Progress Notes (Signed)
Office: [phone]  /  Fax: [phone]    Date: Dec 05, 2018    Appointment Start Time: 8:54am Duration: 37 minutes Provider: Glennie Isle, Psy.D. Type of Session: Individual Therapy  Location of Patient: Home Location of Provider: Provider's Home Type of Contact: Telepsychological Visit via [employer] Web[employer]   Session Content: Rebecca Dominguez is a 43 y.o. female presenting via [place] for a follow-up appointment to address the previously established treatment goal of decreasing emotional eating. Today's appointment was a telepsychological visit, as this provider's clinic is closed for in-person visits due to COVID-19. Therapeutic services will resume to in-person appointments once the clinic re-opens. Rebecca Dominguez expressed understanding regarding the rationale for telepsychological services, and provided verbal consent for today's appointment. Prior to proceeding with today's appointment, Rebecca Dominguez's physical location at the time of this appointment was obtained. Rebecca Dominguez reported she was at home and provided the address. In the event of technical difficulties, Rebecca Dominguez shared a phone number she could be reached at. Rebecca Dominguez and this provider participated in today's telepsychological service. Also, Rebecca Dominguez denied anyone else being present in the room or on the WebEx appointment.  This provider conducted a brief check-in and verbally administered the PHQ-9 and GAD-7. Rebecca Dominguez shared she continues to work on home improvement projects; however, she discussed experiencing pain in her left foot and indicated she met with a podiatrist. She discussed the pain has "deterred" her from exercising. This provider discussed engaging in a body scan, a mindfulness exercise, to help cope with pain. Rebecca Dominguez agreed. Regarding eating, Rebecca Dominguez stated, "I've been pretty much sticking to the meal plan." She discussed during her appointment with Rebecca Dominguez, she was switched to journaling for dinner. She reported an  increased awareness of "why" she is eating, and denied episodes of emotional eating since the last appointment with this provider; however, Rebecca Dominguez expressed concern regarding her ability to eat congruent to the meal plan once restaurants open again and when she is able to meet with her friends. Thus, today's appointment focused on the aforementioned anticipatory anxiety. Rebecca Dominguez acknowledged ordering taking out recently; therefore, her approach to ordering and eating was explored. She reported checking the menu and making substitutions resulting in a meal that was congruent to her meal plan. This was reflected to Rebecca Dominguez and she was encouraged to do the same when going physically to restaurants for a meal once restaurants open. In addition, this provider and [name] discussed other behavioral strategies, such as checking menus ahead of time; eating protein before going to the restaurant if there will be limited options; planning to go out to eat ahead of time; focusing on portion control; and taking food options to gatherings that are congruent to the meal plan. Overall, Rebecca Dominguez was receptive to today's session as evidenced by openness to sharing, responsiveness to feedback, and willingness to implement discussed strategies and continue engaging in learned skills.  Mental Status Examination:  Appearance: neat Behavior: cooperative Mood: euthymic Affect: mood congruent Speech: normal in rate, volume, and tone Eye Contact: appropriate Psychomotor Activity: appropriate Thought Process: linear, logical, and goal directed  Content/Perceptual Disturbances: denies suicidal and homicidal ideation, plan, and intent and no hallucinations, delusions, bizarre thinking or behavior reported or observed Orientation: time, person, place and purpose of appointment Cognition/Sensorium: memory, attention, language, and fund of knowledge intact  Insight: good Judgment: good  Structured Assessment Results:  The Patient Health Questionnaire-9 (PHQ-9) is a self-report measure that assesses symptoms and severity of depression over the course of the last two weeks. Rebecca Dominguez obtained a  score of 0. Decreased interest 0  Down, depressed, hopeless 0  Altered sleeping 0  Tired, decreased energy 0  Change in appetite 0  Feeling bad or failure about yourself 0  Trouble concentrating 0  Moving slowly or fidgety/restless 0  Suicidal thoughts 0  PHQ-9 Score 0    The Generalized Anxiety Disorder-7 (GAD-7) is a brief self-report measure that assesses symptoms of anxiety over the course of the last two weeks. Rebecca Dominguez obtained a score of 0. Nervous, anxious, on edge 0  Control/stop worrying 0  Worrying too much- different things 0  Trouble relaxing 0  Restless 0  Easily annoyed or irritable 0  Afraid-awful might happen 0  GAD-7 Score 0   Interventions:  Verbal administration of PHQ-9 and GAD-7 for symptom monitoring Provided empathic reflections and validation Provided positive reinforcement Employed supportive psychotherapy interventions to facilitate reduced distress, and to improve coping skills with identified stressors Employed insight oriented and cognitive psychotherapy interventions to identify and modify anxiety/mood producing thoughts, beliefs, and negative self-appraisals contributing to distress Conducted a brief chart review  DSM-5 Diagnosis: 311 (F32.8) Other Specified Depressive Disorder, Emotional Eating Behaviors  Treatment Goal & Progress: During the initial appointment with this provider, the following treatment goal was established: decrease emotional eating. [name] has demonstrated progress in her goal as evidenced by increased awareness of hunger patterns and triggers for emotional eating. She discussed the aforementioned has helped her cultivate awareness of "why" she is eating. [name] also denied episodes of emotional eating since the last appointment with this provider  and she continues to demonstrate willingness to engage in learned skills.   Plan: [name] continues to appear able and willing to participate as evidenced by engagement in reciprocal conversation, and asking questions for clarification as appropriate. The next appointment will be scheduled in two weeks, which will be via [employer]. Once this provider's office resumes in-person appointments, [place] will be notified. The next session will focus on termination planning and the introduction of thought defusion to assist with anticipatory anxiety.

## 2018-12-14 NOTE — Progress Notes (Signed)
Office: [phone]  /  Fax: [phone]    Date: December 19, 2018   Appointment Start Time: 7:58am Duration: 32 minutes Provider: Lawerance Cruel, Psy.D. Type of Session: Individual Therapy  Location of Patient: Home Location of Provider: Provider's Home Type of Contact: Telepsychological Visit via [employer] Web[employer]   Session Content: Rebecca Dominguez is a 43 y.o. female presenting via [employer] Web[employer] for a follow-up appointment to address the previously established treatment goal of decreasing emotional eating. Today's appointment was a telepsychological visit, as this provider's clinic is allowing for a limited number of in-person visit due to COVID-19. Therapeutic services will resume to in-person appointments once the clinic resumes all in-person appointments and it is deemed appropriate. Rebecca Dominguez expressed understanding regarding the rationale for telepsychological services, and provided verbal consent for today's appointment. Prior to proceeding with today's appointment, Rebecca Dominguez's physical location at the time of this appointment was obtained. Rebecca Dominguez reported she was at home and provided the address. In the event of technical difficulties, Rebecca Dominguez shared a phone number she could be reached at. Yuli and this provider participated in today's telepsychological service. Also, Rebecca Dominguez denied anyone else being present in the room or on the WebEx appointment.  This provider conducted a brief check-in and verbally administered the PHQ-9 and GAD-7. Rebecca Dominguez reported experiencing stress since the last appointment. She explained her refrigerator stopped working and there have been various changes at work disrupting her workflow. This provider validated Rebecca Dominguez's frustration. She believes her recent fatigue is secondary to ongoing stressors. Additionally, Rebecca Dominguez discussed experiencing "a heavy heart" secondary to current events. Regarding eating, Rebecca Dominguez reported not always eating congruent to the  structured meal plan due to food potentially spoiling. She also discussed eating out on one occasion, specifically pizza. Rebecca Dominguez indicated she ordered thin crust pizza for herself to reduce calorie intake. Notably, Rebecca Dominguez identified deviations from the structured meal plan were secondary to physical hunger. Nevertheless, Rebecca Dominguez disclosed episodes of emotional eating. She indicated, "I try to eat something that is like a protein shake or protein bar or crunchy chips, but still keeping in mind the serving size." The aforementioned was positively reinforced. In regard to mindfulness, Rebecca Dominguez stated, "When I've been faced with these obstacles, especially these past weeks, I'm really making an effort to really take everything in." She described her current mindfulness practice is informal in nature. Due to ongoing personal stressors and those secondary to current events, this provider shared information regarding the app, COVID Coach, to assist with coping during the pandemic and current events (e.g., protests and riots). Rebecca Dominguez was receptive as evidenced by her stating a plan to download the app after today's appointment. Rebecca Dominguez of session focused on termination planning. Rebecca Dominguez shared, "I'm definitely more aware of it [referring to emotional eating] and it is definitely a conscious choice when I'm emotionally eating." She added, "I definitely I feel like I have more awareness of what I'm doing, when I'm eating, and why I'm eating." As such, the frequency of future appointments was discussed. The next appointment will be scheduled in three weeks, and if there are no changes, the last appointment will be scheduled four weeks out. Overall, Rebecca Dominguez was receptive to today's session as evidenced by openness to sharing, responsiveness to feedback, and willingness to continue engaging in learned skills.   Mental Status Examination:  Appearance: neat Behavior: cooperative Mood: euthymic Affect: mood  congruent Speech: normal in rate, volume, and tone Eye Contact: appropriate Psychomotor Activity: appropriate Thought Process: linear, logical, and goal directed  Content/Perceptual Disturbances: denies  suicidal and homicidal ideation, plan, and intent and no hallucinations, delusions, bizarre thinking or behavior reported or observed Orientation: time, person, place and purpose of appointment Cognition/Sensorium: memory, attention, language, and fund of knowledge intact  Insight: good Judgment: good  Structured Assessment Results: The Patient Health Questionnaire-9 (PHQ-9) is a self-report measure that assesses symptoms and severity of depression over the course of the last two weeks. Rebecca LeatherwoodKatherine obtained a score of 1 suggesting minimal depression. Rebecca LeatherwoodKatherine finds the endorsed symptoms to be not difficult at all. Decreased interest 0  Down, depressed, hopeless 0  Altered sleeping 0  Tired, decreased energy 1  Change in appetite 0  Feeling bad or failure about yourself 0  Trouble concentrating 0  Moving slowly or fidgety/restless 0  Suicidal thoughts 0  PHQ-9 Score 1    The Generalized Anxiety Disorder-7 (GAD-7) is a brief self-report measure that assesses symptoms of anxiety over the course of the last two weeks. Rebecca LeatherwoodKatherine obtained a score of 3 suggesting minimal anxiety. Rebecca LeatherwoodKatherine finds the endorsed symptoms to be not difficult at all. Nervous, anxious, on edge 1  Control/stop worrying 0  Worrying too much- different things 0  Trouble relaxing 0  Restless 0  Easily annoyed or irritable 1  Afraid-awful might happen 1  GAD-7 Score 3   Interventions:  Conducted a brief chart review Verbal administration of PHQ-9 and GAD-7 for symptom monitoring Provided empathic reflections and validation Reviewed content from the previous session Discussed termination planning Provided positive reinforcement Employed supportive psychotherapy interventions to facilitate reduced distress, and to  improve coping skills with identified stressors Employed supportive psychotherapy interventions to reduce distress associated with COVID-19 Provided psychoeducation regarding COVID Coach  DSM-5 Diagnosis: 311 (F32.8) Other Specified Depressive Disorder, Emotional Eating Behaviors  Treatment Goal & Progress: During the initial appointment with this provider, the following treatment goal was established: decrease emotional eating. Rebecca LeatherwoodKatherine has demonstrated progress in her goal as evidenced by increased awareness of hunger patterns and her triggers for emotional eating. Despite recent deviations, Rebecca LeatherwoodKatherine described increased awareness has contributed to her making better choices and focusing on portion control. She also continues to demonstrate willingness to engage in learned skills.   Plan: Rebecca LeatherwoodKatherine continues to appear able and willing to participate as evidenced by engagement in reciprocal conversation, and asking questions for clarification as appropriate. The next appointment will be scheduled in three weeks, which will be via [employer] Web[employer]. Once this provider's office resumes in-person appointments and it is deemed appropriate, Rebecca LeatherwoodKatherine will be notified. She noted, "I'm fine with the WebEx." The next session will focus on thought defusion as it was not introduced today due to Silva's presenting concerns.

## 2018-12-15 ENCOUNTER — Other Ambulatory Visit: Payer: Self-pay

## 2018-12-15 ENCOUNTER — Ambulatory Visit (INDEPENDENT_AMBULATORY_CARE_PROVIDER_SITE_OTHER): Payer: BLUE CROSS/BLUE SHIELD | Admitting: Orthotics

## 2018-12-15 DIAGNOSIS — M7750 Other enthesopathy of unspecified foot: Secondary | ICD-10-CM

## 2018-12-15 DIAGNOSIS — M19079 Primary osteoarthritis, unspecified ankle and foot: Secondary | ICD-10-CM

## 2018-12-15 DIAGNOSIS — M722 Plantar fascial fibromatosis: Secondary | ICD-10-CM

## 2018-12-15 NOTE — Progress Notes (Signed)
Patient came into today for casting bilateral f/o to address plantar fasciitis as well as medial column collapse.   Patient reports history of foot pain involving plantar aponeurosis.  Goal is to provide longitudinal arch support and correct any RF instability due to heel eversion/inversion.  Ultimate goal is to relieve tension at pf insertion calcaneal tuberosity.  Plan on semi-rigid device addressing heel stability and relieving PF tension.

## 2018-12-19 ENCOUNTER — Ambulatory Visit (INDEPENDENT_AMBULATORY_CARE_PROVIDER_SITE_OTHER): Payer: BLUE CROSS/BLUE SHIELD | Admitting: Family Medicine

## 2018-12-19 ENCOUNTER — Ambulatory Visit (INDEPENDENT_AMBULATORY_CARE_PROVIDER_SITE_OTHER): Payer: BLUE CROSS/BLUE SHIELD | Admitting: Psychology

## 2018-12-19 ENCOUNTER — Encounter (INDEPENDENT_AMBULATORY_CARE_PROVIDER_SITE_OTHER): Payer: Self-pay | Admitting: Family Medicine

## 2018-12-19 ENCOUNTER — Other Ambulatory Visit: Payer: Self-pay

## 2018-12-19 DIAGNOSIS — F3289 Other specified depressive episodes: Secondary | ICD-10-CM | POA: Diagnosis not present

## 2018-12-19 DIAGNOSIS — Z6841 Body Mass Index (BMI) 40.0 and over, adult: Secondary | ICD-10-CM | POA: Diagnosis not present

## 2018-12-19 NOTE — Progress Notes (Signed)
Office: [phone]  /  Fax: [phone] TeleHealth Visit:  Rebecca Dominguez has verbally consented to this TeleHealth visit today. The patient is located at home, the provider is located at the [facility]. The participants in this visit include the listed provider and patient. The visit was conducted today via Face Time.  HPI:   Chief Complaint: OBESITY Rebecca Dominguez is here to discuss her progress with her obesity treatment plan. She is on the Category 3 plan and is following her eating plan approximately 80 % of the time. She states she is walking and running errands. Rebecca Dominguez has done well maintaining her weight in the last 2 to 3 weeks. She is working to follow her Category 3 plan, but she is not always eating all of her food. Her hunger is controlled. She is still stress, emotional, and boredom eating at times.  We were unable to weigh the patient today for this TeleHealth visit. She feels as if she has maintained weight since her last visit. She has lost 8 lbs since starting treatment with us.  Depression with emotional eating behaviors Rebecca Dominguez is working on reducing emotional eating and feels that she is doing better with this. She notes that it is more of an issue when she skips meals. She is sleeping well and denies suicidal or homicidal ideations. Rebecca Dominguez is struggling with emotional eating and using food for comfort to the extent that it is negatively impacting her health. She often snacks when she is not hungry. Rebecca Dominguez sometimes feels she is out of control and then feels guilty that she made poor food choices. She has been working on behavior modification techniques to help reduce her emotional eating and has been somewhat successful.   [facility] 11/21/2018 11/10/2018 10/26/2018 10/05/2018 09/22/2018  Decreased Interest 0 0 0 0 0  Down, Depressed, Hopeless 0 0 0 0 1  PHQ - 2 Score 0 0 0 0 1  Altered sleeping 0 0 1 1 1   Tired, decreased energy  0 0 1 1 1   Change in appetite 0 0 0 0 0  Feeling bad or failure about yourself  0 0 0 0 1  Trouble concentrating 0 0 0 0 0  Moving slowly or fidgety/restless 0 1 0 0 0  Suicidal thoughts 0 0 0 0 0  PHQ-9 Score 0 1 2 2 4   Difficult doing work/chores - - - - -   ASSESSMENT AND PLAN:  Other depression - with emotional eating   Class 3 severe obesity with serious comorbidity and body mass index (BMI) of 50.0 to 59.9 in adult, unspecified obesity type (HCC)  PLAN:  Depression with Emotional Eating Behaviors We discussed cognitive behavior modification techniques today to help Rebecca Dominguez decrease her emotional eating and depression. We will continue to monitor her and she has agreed to follow up as directed in 2 weeks.  I spent > than 50% of the 25 minute visit on counseling as documented in the note.  Obesity Rebecca Dominguez is currently in the action stage of change. As such, her goal is to continue with weight loss efforts. She has agreed to follow the Category 3 plan. Rebecca Dominguez has been instructed to work up to a goal of 150 minutes of combined cardio and strengthening exercise per week for weight loss and overall health benefits. We discussed the following Behavioral Modification Strategies today: emotional eating strategies, no skipping meals, and better snacking choices.  Rebecca Dominguez has agreed to follow up with our  clinic in 2 weeks. She was informed of the importance of frequent follow up visits to maximize her success with intensive lifestyle modifications for her multiple health conditions.  ALLERGIES: No Known Allergies  MEDICATIONS: Current Outpatient Medications on File Prior to Visit  Medication Sig Dispense Refill  . aspirin-acetaminophen-caffeine (EXCEDRIN MIGRAINE) 250-250-65 MG tablet Take 1 tablet by mouth every 6 (six) hours as needed for headache.    . cholecalciferol (VITAMIN D) 1000 units tablet Take 1,000 Units by mouth daily.    . cyanocobalamin 500 MCG tablet Take  500 mcg by mouth daily.    . ferrous sulfate 325 (65 FE) MG tablet Take 325 mg by mouth daily with breakfast.    . Glucosamine-Chondroit-Vit C-Mn (GLUCOSAMINE CHONDR 1500 COMPLX) CAPS Take 1 capsule by mouth daily.    Marland Kitchen levonorgestrel (MIRENA) 20 MCG/24HR IUD 1 each by Intrauterine route once.    . meloxicam (MOBIC) 15 MG tablet Take 1 tablet (15 mg total) by mouth daily. 30 tablet 0  . Multiple Vitamins-Minerals (MULTIVITAMIN WOMEN) TABS Take 1 tablet by mouth daily.    . naproxen (NAPROSYN) 500 MG tablet Take 500 mg by mouth 2 (two) times daily with a meal.    . vitamin C (ASCORBIC ACID) 500 MG tablet Take 500 mg by mouth daily.     No current facility-administered medications on file prior to visit.     PAST MEDICAL HISTORY: Past Medical History:  Diagnosis Date  . Anemia    low iron  . Back pain   . Foot pain    from previous fracture of toe 2010  . Gallbladder problem    removed  . Headache    occasional  . Hypertension    no longer on medications since weight loss  . Joint pain   . Obesity   . Plantar fasciitis   . Vitamin B 12 deficiency   . Vitamin D deficiency     PAST SURGICAL HISTORY: Past Surgical History:  Procedure Laterality Date  . CHOLECYSTECTOMY  2013  . COLONOSCOPY WITH PROPOFOL N/A 03/06/2016   Procedure: COLONOSCOPY WITH PROPOFOL;  Surgeon: Willis Modena, MD;  Location[facility] ENDOSCOPY;  Service: Endoscopy;  Laterality: N/A;  . gastric bypass surgery  2001  . PILONIDAL CYST EXCISION    . WISDOM TOOTH EXTRACTION      SOCIAL HISTORY: Social History   Tobacco Use  . Smoking status: Never Smoker  . Smokeless tobacco: Never Used  Substance Use Topics  . Alcohol use: Yes    Comment: rare/special occasion  . Drug use: No    FAMILY HISTORY: Family History  Problem Relation Age of Onset  . Breast cancer Mother   . Obesity Mother   . Colon cancer Father   . Diabetes Father   . Obesity Father     ROS: Review of Systems   Psychiatric/Behavioral: Positive for depression. Negative for suicidal ideas.       Negative for homicidal ideations.    PHYSICAL EXAM: Pt in no acute distress  RECENT LABS AND TESTS: BMET    Component Value Date/Time   NA 136 09/12/2018 0957   K 4.2 09/12/2018 0957   CL 100 09/12/2018 0957   CO2 21 09/12/2018 0957   GLUCOSE 79 09/12/2018 0957   BUN 16 09/12/2018 0957   CREATININE 0.71 09/12/2018 0957   CALCIUM 9.8 09/12/2018 0957   GFRNONAA 105 09/12/2018 0957   GFRAA 121 09/12/2018 0957   Lab Results  Component Value Date   HGBA1C  5.3 09/12/2018   Lab Results  Component Value Date   INSULIN 8.8 09/12/2018   CBC    Component Value Date/Time   WBC 6.8 09/12/2018 0957   RBC 4.71 09/12/2018 0957   HGB 12.6 09/12/2018 0957   HCT 38.5 09/12/2018 0957   MCV 82 09/12/2018 0957   MCH 26.8 09/12/2018 0957   MCHC 32.7 09/12/2018 0957   RDW 15.3 09/12/2018 0957   LYMPHSABS 1.7 09/12/2018 0957   EOSABS 0.2 09/12/2018 0957   BASOSABS 0.0 09/12/2018 0957   Iron/TIBC/Ferritin/ %Sat    Component Value Date/Time   IRON 47 09/12/2018 0957   TIBC 405 09/12/2018 0957   FERRITIN 17 09/12/2018 0957   IRONPCTSAT 12 (L) 09/12/2018 0957   Lipid Panel     Component Value Date/Time   CHOL 152 09/12/2018 0957   TRIG 48 09/12/2018 0957   HDL 63 09/12/2018 0957   LDLCALC 79 09/12/2018 0957   Hepatic Function Panel     Component Value Date/Time   PROT 6.9 09/12/2018 0957   ALBUMIN 4.4 09/12/2018 0957   AST 13 09/12/2018 0957   ALT 14 09/12/2018 0957   ALKPHOS 92 09/12/2018 0957   BILITOT 0.3 09/12/2018 0957      Component Value Date/Time   TSH 4.350 09/12/2018 0957   Results for JEHAN, GERRISH (MRN [id number]) as of 12/19/2018 15:35  Ref. Range 09/12/2018 09:57  Vitamin D, 25-Hydroxy Latest Ref Range: 30.0 - 100.0 ng/mL 67.3     I, Kirke Corin, CMA, am acting as transcriptionist for Wilder Glade, MD I have reviewed the above documentation for accuracy and  completeness, and I agree with the above. -Quillian Quince, MD

## 2019-01-04 ENCOUNTER — Ambulatory Visit (INDEPENDENT_AMBULATORY_CARE_PROVIDER_SITE_OTHER): Payer: BLUE CROSS/BLUE SHIELD | Admitting: Family Medicine

## 2019-01-04 ENCOUNTER — Encounter (INDEPENDENT_AMBULATORY_CARE_PROVIDER_SITE_OTHER): Payer: Self-pay | Admitting: Family Medicine

## 2019-01-04 ENCOUNTER — Other Ambulatory Visit: Payer: Self-pay

## 2019-01-04 DIAGNOSIS — Z6841 Body Mass Index (BMI) 40.0 and over, adult: Secondary | ICD-10-CM | POA: Diagnosis not present

## 2019-01-04 DIAGNOSIS — G4709 Other insomnia: Secondary | ICD-10-CM | POA: Diagnosis not present

## 2019-01-05 ENCOUNTER — Other Ambulatory Visit: Payer: BLUE CROSS/BLUE SHIELD | Admitting: Orthotics

## 2019-01-05 NOTE — Progress Notes (Signed)
Office: [phone]  /  Fax: [phone]    Date: January 09, 2019   Appointment Start Time: 8:30am Duration: 31 minutes Provider: Glennie Isle, Psy.D. Type of Session: Individual Therapy  Location of Patient: Home Location of Provider: Provider's Home Type of Contact: Telepsychological Visit via [employer] WebEx   Session Content: Rebecca Dominguez is a 43 y.o. female presenting via [place] for a follow-up appointment to address the previously established treatment goal of decreasing emotional eating. Today's appointment was a telepsychological visit, as this provider's clinic is seeing a limited number of patients for in-person visits due to COVID-19. Therapeutic services will resume to in-person appointments once deemed appropriate. Rebecca Dominguez expressed understanding regarding the rationale for telepsychological services, and provided verbal consent for today's appointment. Prior to proceeding with today's appointment, Rebecca Dominguez's physical location at the time of this appointment was obtained. Rebecca Dominguez reported she was at home and provided the address. In the event of technical difficulties, Rebecca Dominguez shared a phone number she could be reached at. Rebecca Dominguez and this provider participated in today's telepsychological service. Also, Rebecca Dominguez denied anyone else being present in the room or on the WebEx appointment.  This provider conducted a brief check-in and verbally administered the PHQ-9 and GAD-7. Rebecca Dominguez reported ongoing issues with her feet, which is impacting her physical activity. She also discussed ongoing issues at work due to system changes contributing to concentration issues and irritability. Rebecca Dominguez further reported ongoing issues with sleep, specifically trouble falling asleep. Due to ongoing sleeping difficulties, psychoeducation regarding sleep hygiene was provided, and Rebecca Dominguez was given a handout via e-mail. Rebecca Dominguez provided verbal consent during today's appointment for this  provider to send the handout via e-mail. Furthermore, this provider and Rebecca Dominguez further discussed termination planning. Based on progress (I.e., no emotional eating since the last appointment with this provider), the next appointment will be scheduled in 4 weeks and it will be a termination appointment. Rebecca Dominguez was receptive to today's session as evidenced by openness to sharing, responsiveness to feedback, and willingness to implement sleep hygiene strategies and engage in learned skills.  Mental Status Examination:  Appearance: neat Behavior: cooperative Mood: euthymic Affect: mood congruent Speech: normal in rate, volume, and tone Eye Contact: appropriate Psychomotor Activity: appropriate Thought Process: linear, logical, and goal directed  Content/Perceptual Disturbances: denies suicidal and homicidal ideation, plan, and intent and no hallucinations, delusions, bizarre thinking or behavior reported or observed Orientation: time, person, place and purpose of appointment Cognition/Sensorium: memory, attention, language, and fund of knowledge intact  Insight: good Judgment: good  Structured Assessment Results: The Patient Health Questionnaire-9 (PHQ-9) is a self-report measure that assesses symptoms and severity of depression over the course of the last two weeks. Rebecca Dominguez obtained a score of 3 suggesting minimal depression. Rebecca Dominguez finds the endorsed symptoms to be somewhat difficult. Rebecca Dominguez interest or pleasure in doing things 0  Feeling down, depressed, or hopeless 0  Trouble falling or staying asleep, or sleeping too much 1  Feeling tired or having Rebecca Dominguez energy 1  Poor appetite or overeating 0  Feeling bad about yourself --- or that you are a failure or have let yourself or your family down 0  Trouble concentrating on things, such as reading the newspaper or watching television 1  Moving or speaking so slowly that other people could have noticed? Or the opposite --- being so  fidgety or restless that you have been moving around a lot more than usual 0  Thoughts that you would be better off dead or hurting yourself in some way  0  PHQ-9 Score 3    The Generalized Anxiety Disorder-7 (GAD-7) is a brief self-report measure that assesses symptoms of anxiety over the course of the last two weeks. Rebecca Dominguez obtained a score of 1 suggesting minimal anxiety. Rebecca Dominguez finds the endorsed symptoms to be very difficult. Feeling nervous, anxious, on edge 0  Not being able to stop or control worrying 0  Worrying too much about different things 0  Trouble relaxing 0  Being so restless that it's hard to sit still 0  Becoming easily annoyed or irritable 1  Feeling afraid as if something awful might happen 0  GAD-7 Score 1   Interventions:  Conducted a brief chart review Verbal administration of PHQ-9 and GAD-7 for symptom monitoring Provided empathic reflections and validation Psychoeducation provided regarding sleep hygiene Discussed termination planning Provided positive reinforcement Employed supportive psychotherapy interventions to facilitate reduced distress, and to improve coping skills with identified stressors  DSM-5 Diagnosis: 311 (F32.8) Other Specified Depressive Disorder, Emotional Eating Behaviors  Treatment Goal & Progress: During the initial appointment with this provider, the following treatment goal was established: decrease emotional eating. Rebecca Dominguez has demonstrated progress in her goal as evidenced by increased awareness in hunger patterns and triggers for emotional eating, as well as an increased ability to cope. Since the last appointment with this provider, Rebecca Dominguez denied episodes of emotional eating.   Plan: Rebecca Dominguez continues to appear able and willing to participate as evidenced by engagement in reciprocal conversation, and asking questions for clarification as appropriate. The next appointment will be scheduled in one month, which will be via  [employer] WebEx. Once this provider's office resumes in-person appointments and it is deemed appropriate, Rebecca Dominguez will be notified. The next session will focus on reviewing learned skills, thought defusion, and termination. Notably, thought defusion was not introduced today based on Kiaja's presenting concern.

## 2019-01-09 ENCOUNTER — Other Ambulatory Visit: Payer: Self-pay

## 2019-01-09 ENCOUNTER — Ambulatory Visit (INDEPENDENT_AMBULATORY_CARE_PROVIDER_SITE_OTHER): Payer: BLUE CROSS/BLUE SHIELD | Admitting: Psychology

## 2019-01-09 DIAGNOSIS — F3289 Other specified depressive episodes: Secondary | ICD-10-CM

## 2019-01-09 NOTE — Progress Notes (Signed)
Office: [phone]  /  Fax: [phone] TeleHealth Visit:  Rebecca Dominguez has verbally consented to this TeleHealth visit today. The patient is located at home, the provider is located at the [employer] [facility] office. The participants in this visit include the listed provider and patient. The visit was conducted today via Face Time.  HPI:   Chief Complaint: OBESITY Rebecca Dominguez is here to discuss her progress with her obesity treatment plan. She is on the Category 3 plan and is following her eating plan approximately 95 % of the time. She states she is exercising 0 minutes 0 times per week. Rebecca Dominguez feels that she is doing well maintaining her weight on her Category 3 plan. She is not exercising due to her plantar fascitis and she is getting prescription inserts tomorrow from her podiatrist. She is happy with her plan and states that her hunger is controlled.  We were unable to weigh the patient today for this TeleHealth visit. She feels as if she has maintained weight since her last visit. She has lost 8 lbs since starting treatment with [place].  Insomnia Rebecca Dominguez notes worsening sleep in the last 2 weeks with increased stress and work. She states this has happened before and is usually limited. She takes Excedrin pm 1 to 2 times a week if she has pain and feels that is helps.  ASSESSMENT AND PLAN:  Other insomnia  Class 3 severe obesity with serious comorbidity and body mass index (BMI) of 50.0 to 59.9 in adult, unspecified obesity type ([place])  PLAN:  Insomnia The problem of recurrent insomnia was discussed with Rebecca Dominguez. Avoidance of caffeine sources was strongly encouraged and sleep hygiene techniques were reviewed. Rebecca Dominguez agreed to try the techniques and will defer sleep aids for now. She agreed to follow up in 2 to 3 weeks.  I spent > than 50% of the 15 minute visit on counseling as documented in the note.  Obesity Rebecca Dominguez is currently in the action stage of  change. As such, her goal is to continue with weight loss efforts. She has agreed to follow the Category 3 plan. Rebecca Dominguez has been instructed to work up to a goal of 150 minutes of combined cardio and strengthening exercise per week for weight loss and overall health benefits. We discussed the following Behavioral Modification Strategies today: ways to avoid night time snacking and better snacking choices.  Bunnie has agreed to follow up with our clinic in 2 to 3 weeks. She was informed of the importance of frequent follow up visits to maximize her success with intensive lifestyle modifications for her multiple health conditions.  ALLERGIES: No Known Allergies  MEDICATIONS: Current Outpatient Medications on File Prior to Visit  Medication Sig Dispense Refill  . aspirin-acetaminophen-caffeine (EXCEDRIN MIGRAINE) 250-250-65 MG tablet Take 1 tablet by mouth every 6 (six) hours as needed for headache.    . cholecalciferol (VITAMIN D) 1000 units tablet Take 1,000 Units by mouth daily.    . cyanocobalamin 500 MCG tablet Take 500 mcg by mouth daily.    . ferrous sulfate 325 (65 FE) MG tablet Take 325 mg by mouth daily with breakfast.    . Glucosamine-Chondroit-Vit C-Mn (GLUCOSAMINE CHONDR 1500 COMPLX) CAPS Take 1 capsule by mouth daily.    Marland Kitchen levonorgestrel (MIRENA) 20 MCG/24HR IUD 1 each by Intrauterine route once.    . meloxicam (MOBIC) 15 MG tablet Take 1 tablet (15 mg total) by mouth daily. 30 tablet 0  . Multiple Vitamins-Minerals (MULTIVITAMIN WOMEN) TABS Take 1  tablet by mouth daily.    . naproxen (NAPROSYN) 500 MG tablet Take 500 mg by mouth 2 (two) times daily with a meal.    . vitamin C (ASCORBIC ACID) 500 MG tablet Take 500 mg by mouth daily.     No current facility-administered medications on file prior to visit.     PAST MEDICAL HISTORY: Past Medical History:  Diagnosis Date  . Anemia    low iron  . Back pain   . Foot pain    from previous fracture of toe 2010  .  Gallbladder problem    removed  . Headache    occasional  . Hypertension    no longer on medications since weight loss  . Joint pain   . Obesity   . Plantar fasciitis   . Vitamin B 12 deficiency   . Vitamin D deficiency     PAST SURGICAL HISTORY: Past Surgical History:  Procedure Laterality Date  . CHOLECYSTECTOMY  2013  . COLONOSCOPY WITH PROPOFOL N/A 03/06/2016   Procedure: COLONOSCOPY WITH PROPOFOL;  Surgeon: Willis ModenaWilliam Outlaw, MD;  [facility];  Service: Endoscopy;  Laterality: N/A;  . gastric bypass surgery  2001  . PILONIDAL CYST EXCISION    . WISDOM TOOTH EXTRACTION      SOCIAL HISTORY: Social History   Tobacco Use  . Smoking status: Never Smoker  . Smokeless tobacco: Never Used  Substance Use Topics  . Alcohol use: Yes    Comment: rare/special occasion  . Drug use: No    FAMILY HISTORY: Family History  Problem Relation Age of Onset  . Breast cancer Mother   . Obesity Mother   . Colon cancer Father   . Diabetes Father   . Obesity Father     ROS: Review of Systems  Psychiatric/Behavioral: The patient has insomnia.     PHYSICAL EXAM: Pt in no acute distress  RECENT LABS AND TESTS: BMET    Component Value Date/Time   NA 136 09/12/2018 0957   K 4.2 09/12/2018 0957   CL 100 09/12/2018 0957   CO2 21 09/12/2018 0957   GLUCOSE 79 09/12/2018 0957   BUN 16 09/12/2018 0957   CREATININE 0.71 09/12/2018 0957   CALCIUM 9.8 09/12/2018 0957   GFRNONAA 105 09/12/2018 0957   GFRAA 121 09/12/2018 0957   Lab Results  Component Value Date   HGBA1C 5.3 09/12/2018   Lab Results  Component Value Date   INSULIN 8.8 09/12/2018   CBC    Component Value Date/Time   WBC 6.8 09/12/2018 0957   RBC 4.71 09/12/2018 0957   HGB 12.6 09/12/2018 0957   HCT 38.5 09/12/2018 0957   MCV 82 09/12/2018 0957   MCH 26.8 09/12/2018 0957   MCHC 32.7 09/12/2018 0957   RDW 15.3 09/12/2018 0957   LYMPHSABS 1.7 09/12/2018 0957   EOSABS 0.2 09/12/2018 0957    BASOSABS 0.0 09/12/2018 0957   Iron/TIBC/Ferritin/ %Sat    Component Value Date/Time   IRON 47 09/12/2018 0957   TIBC 405 09/12/2018 0957   FERRITIN 17 09/12/2018 0957   IRONPCTSAT 12 (L) 09/12/2018 0957   Lipid Panel     Component Value Date/Time   CHOL 152 09/12/2018 0957   TRIG 48 09/12/2018 0957   HDL 63 09/12/2018 0957   LDLCALC 79 09/12/2018 0957   Hepatic Function Panel     Component Value Date/Time   PROT 6.9 09/12/2018 0957   ALBUMIN 4.4 09/12/2018 0957   AST 13 09/12/2018 0957  ALT 14 09/12/2018 0957   ALKPHOS 92 09/12/2018 0957   BILITOT 0.3 09/12/2018 0957      Component Value Date/Time   TSH 4.350 09/12/2018 0957   Results for Evonnie PatAYLOR, Areta D (MRN [id number]) as of 01/10/2019 13:28  Ref. Range 09/12/2018 09:57  Vitamin D, 25-Hydroxy Latest Ref Range: 30.0 - 100.0 ng/mL 67.3     I, Kirke Corinara Soares, CMA, am acting as transcriptionist for Wilder Gladearen D. Eloyce Bultman, MD I have reviewed the above documentation for accuracy and completeness, and I agree with the above. -Quillian Quincearen Shanta Dorvil, MD

## 2019-01-12 ENCOUNTER — Ambulatory Visit (INDEPENDENT_AMBULATORY_CARE_PROVIDER_SITE_OTHER): Payer: BLUE CROSS/BLUE SHIELD | Admitting: Orthotics

## 2019-01-12 ENCOUNTER — Other Ambulatory Visit: Payer: Self-pay

## 2019-01-12 DIAGNOSIS — M722 Plantar fascial fibromatosis: Secondary | ICD-10-CM

## 2019-01-12 NOTE — Progress Notes (Signed)
Patient came in today to pick up custom made foot orthotics.  The goals were accomplished and the patient reported no dissatisfaction with said orthotics.  Patient was advised of breakin period and how to report any issues. 

## 2019-01-19 ENCOUNTER — Encounter (INDEPENDENT_AMBULATORY_CARE_PROVIDER_SITE_OTHER): Payer: Self-pay | Admitting: Family Medicine

## 2019-01-19 ENCOUNTER — Telehealth (INDEPENDENT_AMBULATORY_CARE_PROVIDER_SITE_OTHER): Payer: BLUE CROSS/BLUE SHIELD | Admitting: Family Medicine

## 2019-01-19 ENCOUNTER — Other Ambulatory Visit: Payer: Self-pay

## 2019-01-19 DIAGNOSIS — E559 Vitamin D deficiency, unspecified: Secondary | ICD-10-CM | POA: Diagnosis not present

## 2019-01-19 DIAGNOSIS — E8881 Metabolic syndrome: Secondary | ICD-10-CM | POA: Diagnosis not present

## 2019-01-19 DIAGNOSIS — Z6841 Body Mass Index (BMI) 40.0 and over, adult: Secondary | ICD-10-CM

## 2019-01-19 NOTE — Progress Notes (Signed)
Office: [phone]  /  Fax: [phone] TeleHealth Visit:  Rebecca Dominguez has verbally consented to this TeleHealth visit today. The patient is located at home, the provider is located at the [employer] [facility] office. The participants in this visit include the listed provider and patient. The visit was conducted today via face time.  HPI:   Chief Complaint: OBESITY Rebecca Dominguez is here to discuss her progress with her obesity treatment plan. She is on the Category 3 plan and is following her eating plan approximately 90 % of the time. She states she is doing projects around the house. Kimala's weight was of 320.4 lbs yesterday. She has been able to find most of the food on the plan now. Her weight is fluctuating between 319 lbs and 322 lbs. She has no issues with hunger or cravings. We were unable to weigh the patient today for this TeleHealth visit. She feels as if she has maintained her weight since her last visit. She has lost 8 lbs since starting treatment with [place].  Vitamin D Deficiency Rebecca Dominguez has a diagnosis of vitamin D deficiency. She is currently taking OTC Vit D. She notes fatigue and denies nausea, vomiting or muscle weakness.  Insulin Resistance Rebecca Dominguez has a diagnosis of insulin resistance based on her elevated fasting insulin level >5. Last labs were in February 2020. Although Auriel's blood glucose readings are still under good control, insulin resistance puts her at greater risk of metabolic syndrome and diabetes. She notes minimal carbohydrate cravings. She is not taking metformin currently and continues to work on diet and exercise to decrease risk of diabetes.  ASSESSMENT AND PLAN:  Vitamin D deficiency  Insulin resistance  Class 3 severe obesity with serious comorbidity and body mass index (BMI) of 50.0 to 59.9 in adult, unspecified obesity type ([place])  PLAN:  Vitamin D Deficiency Rebecca Dominguez was informed that low vitamin D levels contributes to  fatigue and are associated with obesity, breast, and colon cancer. Rebecca Dominguez agrees to continue taking OTC Vit Dand will follow up for routine testing of vitamin D, at least 2-3 times per year. She was informed of the risk of over-replacement of vitamin D and agrees to not increase her dose unless she discusses this with [place] first. Rebecca Dominguez agrees to follow up with our clinic in 2 weeks.  Insulin Resistance Rebecca Dominguez will continue to work on weight loss, exercise, and decreasing simple carbohydrates in her diet to help decrease the risk of diabetes. We dicussed metformin including benefits and risks. She was informed that eating too many simple carbohydrates or too many calories at one sitting increases the likelihood of GI side effects. Rebecca Dominguez declined metformin for now and prescription was not written today. We will repeat labs at her first in office appointment. Rebecca Dominguez agrees to follow up with our clinic in 2 weeks as directed to monitor her progress.  Obesity Rebecca Dominguez is currently in the action stage of change. As such, her goal is to continue with weight loss efforts She has agreed to follow the Category 3 plan Rebecca Dominguez has been instructed to work up to a goal of 150 minutes of combined cardio and strengthening exercise per week for weight loss and overall health benefits. We discussed the following Behavioral Modification Strategies today: increasing lean protein intake, increasing vegetables and work on meal planning and easy cooking plans, keeping healthy foods in the home, and planning for success   Rebecca Dominguez has agreed to follow up with our clinic in 2 weeks. She was  informed of the importance of frequent follow up visits to maximize her success with intensive lifestyle modifications for her multiple health conditions.  ALLERGIES: No Known Allergies  MEDICATIONS: Current Outpatient Medications on File Prior to Visit  Medication Sig Dispense Refill  .  aspirin-acetaminophen-caffeine (EXCEDRIN MIGRAINE) 250-250-65 MG tablet Take 1 tablet by mouth every 6 (six) hours as needed for headache.    . cholecalciferol (VITAMIN D) 1000 units tablet Take 1,000 Units by mouth daily.    . cyanocobalamin 500 MCG tablet Take 500 mcg by mouth daily.    . ferrous sulfate 325 (65 FE) MG tablet Take 325 mg by mouth daily with breakfast.    . Glucosamine-Chondroit-Vit C-Mn (GLUCOSAMINE CHONDR 1500 COMPLX) CAPS Take 1 capsule by mouth daily.    Marland Kitchen. levonorgestrel (MIRENA) 20 MCG/24HR IUD 1 each by Intrauterine route once.    . Multiple Vitamins-Minerals (MULTIVITAMIN WOMEN) TABS Take 1 tablet by mouth daily.    . naproxen (NAPROSYN) 500 MG tablet Take 500 mg by mouth 2 (two) times daily with a meal.    . vitamin C (ASCORBIC ACID) 500 MG tablet Take 500 mg by mouth daily.    . meloxicam (MOBIC) 15 MG tablet Take 1 tablet (15 mg total) by mouth daily. (Patient not taking: Reported on 01/19/2019) 30 tablet 0   No current facility-administered medications on file prior to visit.     PAST MEDICAL HISTORY: Past Medical History:  Diagnosis Date  . Anemia    low iron  . Back pain   . Foot pain    from previous fracture of toe 2010  . Gallbladder problem    removed  . Headache    occasional  . Hypertension    no longer on medications since weight loss  . Joint pain   . Obesity   . Plantar fasciitis   . Vitamin B 12 deficiency   . Vitamin D deficiency     PAST SURGICAL HISTORY: Past Surgical History:  Procedure Laterality Date  . CHOLECYSTECTOMY  2013  . COLONOSCOPY WITH PROPOFOL N/A 03/06/2016   Procedure: COLONOSCOPY WITH PROPOFOL;  Surgeon: Willis ModenaWilliam Outlaw, MD;  [facility];  Service: Endoscopy;  Laterality: N/A;  . gastric bypass surgery  2001  . PILONIDAL CYST EXCISION    . WISDOM TOOTH EXTRACTION      SOCIAL HISTORY: Social History   Tobacco Use  . Smoking status: Never Smoker  . Smokeless tobacco: Never Used  Substance Use Topics   . Alcohol use: Yes    Comment: rare/special occasion  . Drug use: No    FAMILY HISTORY: Family History  Problem Relation Age of Onset  . Breast cancer Mother   . Obesity Mother   . Colon cancer Father   . Diabetes Father   . Obesity Father     ROS: Review of Systems  Constitutional: Positive for malaise/fatigue. Negative for weight loss.  Gastrointestinal: Negative for nausea and vomiting.  Musculoskeletal:       Negative muscle weakness    PHYSICAL EXAM: Pt in no acute distress  RECENT LABS AND TESTS: BMET    Component Value Date/Time   NA 136 09/12/2018 0957   K 4.2 09/12/2018 0957   CL 100 09/12/2018 0957   CO2 21 09/12/2018 0957   GLUCOSE 79 09/12/2018 0957   BUN 16 09/12/2018 0957   CREATININE 0.71 09/12/2018 0957   CALCIUM 9.8 09/12/2018 0957   GFRNONAA 105 09/12/2018 0957   GFRAA 121 09/12/2018 0957   Lab  Results  Component Value Date   HGBA1C 5.3 09/12/2018   Lab Results  Component Value Date   INSULIN 8.8 09/12/2018   CBC    Component Value Date/Time   WBC 6.8 09/12/2018 0957   RBC 4.71 09/12/2018 0957   HGB 12.6 09/12/2018 0957   HCT 38.5 09/12/2018 0957   MCV 82 09/12/2018 0957   MCH 26.8 09/12/2018 0957   MCHC 32.7 09/12/2018 0957   RDW 15.3 09/12/2018 0957   LYMPHSABS 1.7 09/12/2018 0957   EOSABS 0.2 09/12/2018 0957   BASOSABS 0.0 09/12/2018 0957   Iron/TIBC/Ferritin/ %Sat    Component Value Date/Time   IRON 47 09/12/2018 0957   TIBC 405 09/12/2018 0957   FERRITIN 17 09/12/2018 0957   IRONPCTSAT 12 (L) 09/12/2018 0957   Lipid Panel     Component Value Date/Time   CHOL 152 09/12/2018 0957   TRIG 48 09/12/2018 0957   HDL 63 09/12/2018 0957   LDLCALC 79 09/12/2018 0957   Hepatic Function Panel     Component Value Date/Time   PROT 6.9 09/12/2018 0957   ALBUMIN 4.4 09/12/2018 0957   AST 13 09/12/2018 0957   ALT 14 09/12/2018 0957   ALKPHOS 92 09/12/2018 0957   BILITOT 0.3 09/12/2018 0957      Component Value Date/Time    TSH 4.350 09/12/2018 0957      I, Burt KnackSharon Martin, am acting as transcriptionist for Debbra RidingAlexandria Kadolph, MD  I have reviewed the above documentation for accuracy and completeness, and I agree with the above. - Debbra RidingAlexandria Kadolph, MD

## 2019-02-02 ENCOUNTER — Other Ambulatory Visit: Payer: Self-pay

## 2019-02-02 ENCOUNTER — Telehealth (INDEPENDENT_AMBULATORY_CARE_PROVIDER_SITE_OTHER): Payer: BLUE CROSS/BLUE SHIELD | Admitting: Family Medicine

## 2019-02-02 ENCOUNTER — Encounter (INDEPENDENT_AMBULATORY_CARE_PROVIDER_SITE_OTHER): Payer: Self-pay | Admitting: Family Medicine

## 2019-02-02 DIAGNOSIS — E559 Vitamin D deficiency, unspecified: Secondary | ICD-10-CM | POA: Diagnosis not present

## 2019-02-02 DIAGNOSIS — Z6841 Body Mass Index (BMI) 40.0 and over, adult: Secondary | ICD-10-CM

## 2019-02-02 DIAGNOSIS — E8881 Metabolic syndrome: Secondary | ICD-10-CM

## 2019-02-06 ENCOUNTER — Ambulatory Visit (INDEPENDENT_AMBULATORY_CARE_PROVIDER_SITE_OTHER): Payer: BLUE CROSS/BLUE SHIELD | Admitting: Psychology

## 2019-02-06 ENCOUNTER — Other Ambulatory Visit: Payer: Self-pay

## 2019-02-06 DIAGNOSIS — F3289 Other specified depressive episodes: Secondary | ICD-10-CM

## 2019-02-06 NOTE — Progress Notes (Signed)
Office: [phone]  /  Fax: [phone]    Date: February 06, 2019    Appointment Start Time: 8:30am Duration: 31 minutes  Provider: Glennie Isle, Psy.D. Type of Session: Individual Therapy  Location of Patient: Home Location of Provider: Provider's Home Type of Contact: Telepsychological Visit via [employer] WebEx   Session Content: Rebecca Dominguez is a 43 y.o. female presenting via [place] for a follow-up appointment to address the previously established treatment goal of decreasing emotional eating. Today's appointment was a telepsychological visit, as this provider's clinic is seeing a limited number of patients for in-person visits due to COVID-19. Therapeutic services will resume to in-person appointments once deemed appropriate. Rebecca Dominguez expressed understanding regarding the rationale for telepsychological services, and provided verbal consent for today's appointment. Prior to proceeding with today's appointment, Rebecca Dominguez's physical location at the time of this appointment was obtained. Rebecca Dominguez reported she was at home and provided the address. In the event of technical difficulties, Rebecca Dominguez shared a phone number she could be reached at. Rebecca Dominguez and this provider participated in today's telepsychological service. Also, Rebecca Dominguez denied anyone else being present in the room or on the WebEx appointment.  This provider conducted a brief check-in and verbally administered the PHQ-9 and GAD-7. Rebecca Dominguez shared about recent events, including her birthday celebration. She discussed disappointment related to her vacation plan for her 10th wedding anniversary in October due to the pandemic. This was validated and this provider explored other ways they could celebrate their anniversary. Rebecca Dominguez indicated the possibility of taking a weekend trip to the mountains and/or going out to eat to a [employer] risk survey[employer]. Additionally, she discussed frustration with having to do "the same things" every day due to  the pandemic. Thus, planning pleasurable activities ahead of time to establish a routine was discussed. Rebecca Dominguez agreed.   Regarding eating, Rebecca Dominguez reported, "Overall, it is good." She believes she continues to lose weight. Overall, Rebecca Dominguez indicated a reduction in emotional eating. She discussed infrequent episodes of emotional eating recently due to stress, but indicated she utilized her snack calories. To assist with coping, Rebecca Dominguez was introduced to a thought defusion exercise, "Leaves on A Stream." It was recommended she engage in the exercise at least once a day for the next 2 weeks due to recent stressors; she agreed.  Rebecca Dominguez provided verbal consent during today's appointment for this provider to send the handout via e-mail. Rebecca Dominguez was receptive to today's session as evidenced by openness to sharing, responsiveness to feedback, and willingness to continue engaging in learned skills. She added "I really appreciate all the help and insight you have provided."  Mental Status Examination:  Appearance: neat Behavior: cooperative Mood: euthymic Affect: mood congruent Speech: normal in rate, volume, and tone Eye Contact: appropriate Psychomotor Activity: appropriate Thought Process: linear, logical, and goal directed  Content/Perceptual Disturbances: denies suicidal and homicidal ideation, plan, and intent and no hallucinations, delusions, bizarre thinking or behavior reported or observed Orientation: time, person, place and purpose of appointment Cognition/Sensorium: memory, attention, language, and fund of knowledge intact  Insight: good Judgment: good  Structured Assessment Results: The Patient Health Questionnaire-9 (PHQ-9) is a self-report measure that assesses symptoms and severity of depression over the course of the last two weeks. Rebecca Dominguez obtained a score of 2 suggesting minimal depression. Rebecca Dominguez finds the endorsed symptoms to be not difficult at all. Little interest  or pleasure in doing things 0  Feeling down, depressed, or hopeless 0  Trouble falling or staying asleep, or sleeping too much 0  Feeling tired or  having little energy 1  Poor appetite or overeating 0  Feeling bad about yourself --- or that you are a failure or have let yourself or your family down 0  Trouble concentrating on things, such as reading the newspaper or watching television 1  Moving or speaking so slowly that other people could have noticed? Or the opposite --- being so fidgety or restless that you have been moving around a lot more than usual 0  Thoughts that you would be better off dead or hurting yourself in some way 0  PHQ-9 Score 2    The Generalized Anxiety Disorder-7 (GAD-7) is a brief self-report measure that assesses symptoms of anxiety over the course of the last two weeks. Rebecca Dominguez obtained a score of 3 suggesting minimal anxiety. Rebecca Dominguez finds the endorsed symptoms to be somewhat difficult. Feeling nervous, anxious, on edge 0  Not being able to stop or control worrying 0  Worrying too much about different things 0  Trouble relaxing 0  Being so restless that it's hard to sit still 0  Becoming easily annoyed or irritable 3  Feeling afraid as if something awful might happen 0  GAD-7 Score 3   Interventions:  Conducted a brief chart review Verbal administration of PHQ-9 and GAD-7 for symptom monitoring Reviewed content from the previous session Engaged patient in problem solving Employed supportive psychotherapy interventions to facilitate reduced distress, and to improve coping skills with identified stressors Employed supportive psychotherapy interventions to reduce distress associated with COVID-19 Psychoeducation provided regaridng a thought defusion exercise  DSM-5 Diagnosis: 311 (F32.8) Other Specified Depressive Disorder, Emotional Eating Behaviors  Treatment Goal & Progress: During the initial appointment with this provider, the following treatment goal  was established: decrease emotional eating. Rebecca Dominguez has demonstrated progress in her goal as evidenced by increased awareness of hunger patterns and triggers for emotional eating. Since the onset of treatment, Rebecca Dominguez reported a decrease in emotional eating and an increase in her ability to cope. She also continues to demonstrate willingness to engage in learned skills.   Plan: Today was Rebecca Dominguez's last appointment with this provider.

## 2019-02-06 NOTE — Progress Notes (Signed)
Office: [phone]  /  Fax: [phone] TeleHealth Visit:  Evonnie PatKatherine D Dominguez has verbally consented to this TeleHealth visit today. The patient is located at home, the provider is located at the [facility] office. The participants in this visit include the listed provider and patient and any and all parties involved. The visit was conducted today via FaceTime.  HPI:   Chief Complaint: OBESITY Rebecca Dominguez is here to discuss her progress with her obesity treatment plan. She is on the Category 3 plan and is following her eating plan approximately 90 % of the time. She states she is doing housework and walking 30 minutes 1 to 2 times per week. Rebecca Dominguez has had a few disappointments. She is cancelling the cruise for her husband and her anniversary, and she is starting to experience caution fatigue. Rebecca Dominguez does try to employ significant caution when leaving home. She has a weight of 320. 2 pounds (has been as low as 316.4 lbs). Rebecca Dominguez denies hunger. We were unable to weigh the patient today for this TeleHealth visit. She feels as if she has lost weight since her last visit. She has lost 8 lbs since starting treatment with us.  Insulin Resistance Rebecca Dominguez has a diagnosis of insulin resistance based on her elevated fasting insulin level >5. Although Rebecca Dominguez's blood glucose readings are still under good control, insulin resistance puts her at greater risk of metabolic syndrome and diabetes. She is not on metformin. Rebecca Dominguez continues to work on diet and exercise to decrease risk of diabetes. Rebecca Dominguez has minimal carb cravings.  Vitamin D deficiency Rebecca Dominguez has a diagnosis of vitamin D deficiency. She is currently taking vit D. Rebecca Dominguez admits fatigue and denies nausea, vomiting or muscle weakness.  ASSESSMENT AND PLAN:  Insulin resistance  Vitamin D deficiency  Class 3 severe obesity with serious comorbidity and body mass index (BMI) of 50.0 to 59.9 in adult,  unspecified obesity type (HCC)  PLAN:  Insulin Resistance Rebecca Dominguez will continue to work on weight loss, exercise, and decreasing simple carbohydrates in her diet to help decrease the risk of diabetes. She was informed that eating too many simple carbohydrates or too many calories at one sitting increases the likelihood of GI side effects. Rebecca Dominguez will continue the Category 3 plan and follow up with us as directed to monitor her progress.  Vitamin D Deficiency Rebecca Dominguez was informed that low vitamin D levels contributes to fatigue and are associated with obesity, breast, and colon cancer. She will continue to take OTC vitamin D and will follow up for routine testing of vitamin D, at least 2-3 times per year. She was informed of the risk of over-replacement of vitamin D and agrees to not increase her dose unless she discusses this with us first. We will check labs at the next appointment that is in the office.  Obesity Rebecca Dominguez is currently in the action stage of change. As such, her goal is to continue with weight loss efforts She has agreed to follow the Category 3 plan Rebecca Dominguez has been instructed to work up to a goal of 150 minutes of combined cardio and strengthening exercise per week for weight loss and overall health benefits. We discussed the following Behavioral Modification Strategies today: planning for success, keeping healthy foods in the home, better snacking choices, increasing lean protein intake, increasing vegetables and work on meal planning and easy cooking plans  Rebecca Dominguez has agreed to follow up with our clinic in 2 weeks. She was informed of the importance of frequent  follow up visits to maximize her success with intensive lifestyle modifications for her multiple health conditions.  ALLERGIES: No Known Allergies  MEDICATIONS: Current Outpatient Medications on File Prior to Visit  Medication Sig Dispense Refill  . aspirin-acetaminophen-caffeine (EXCEDRIN MIGRAINE)  250-250-65 MG tablet Take 1 tablet by mouth every 6 (six) hours as needed for headache.    . cholecalciferol (VITAMIN D) 1000 units tablet Take 1,000 Units by mouth daily.    . cyanocobalamin 500 MCG tablet Take 500 mcg by mouth daily.    . ferrous sulfate 325 (65 FE) MG tablet Take 325 mg by mouth daily with breakfast.    . Glucosamine-Chondroit-Vit C-Mn (GLUCOSAMINE CHONDR 1500 COMPLX) CAPS Take 1 capsule by mouth daily.    Marland Kitchen levonorgestrel (MIRENA) 20 MCG/24HR IUD 1 each by Intrauterine route once.    . Multiple Vitamins-Minerals (MULTIVITAMIN WOMEN) TABS Take 1 tablet by mouth daily.    . naproxen (NAPROSYN) 500 MG tablet Take 500 mg by mouth 2 (two) times daily with a meal.    . vitamin C (ASCORBIC ACID) 500 MG tablet Take 500 mg by mouth daily.    . meloxicam (MOBIC) 15 MG tablet Take 1 tablet (15 mg total) by mouth daily. (Patient not taking: Reported on 02/02/2019) 30 tablet 0   No current facility-administered medications on file prior to visit.     PAST MEDICAL HISTORY: Past Medical History:  Diagnosis Date  . Anemia    low iron  . Back pain   . Foot pain    from previous fracture of toe 2010  . Gallbladder problem    removed  . Headache    occasional  . Hypertension    no longer on medications since weight loss  . Joint pain   . Obesity   . Plantar fasciitis   . Vitamin B 12 deficiency   . Vitamin D deficiency     PAST SURGICAL HISTORY: Past Surgical History:  Procedure Laterality Date  . CHOLECYSTECTOMY  2013  . COLONOSCOPY WITH PROPOFOL N/A 03/06/2016   Procedure: COLONOSCOPY WITH PROPOFOL;  Surgeon: Arta Silence, MD;  [facility] ENDOSCOPY;  Service: Endoscopy;  Laterality: N/A;  . gastric bypass surgery  2001  . PILONIDAL CYST EXCISION    . WISDOM TOOTH EXTRACTION      SOCIAL HISTORY: Social History   Tobacco Use  . Smoking status: Never Smoker  . Smokeless tobacco: Never Used  Substance Use Topics  . Alcohol use: Yes    Comment: rare/special  occasion  . Drug use: No    FAMILY HISTORY: Family History  Problem Relation Age of Onset  . Breast cancer Mother   . Obesity Mother   . Colon cancer Father   . Diabetes Father   . Obesity Father     ROS: Review of Systems  Constitutional: Positive for malaise/fatigue and weight loss.  Gastrointestinal: Negative for nausea and vomiting.  Musculoskeletal:       Negative for muscle weakness    PHYSICAL EXAM: Pt in no acute distress  RECENT LABS AND TESTS: BMET    Component Value Date/Time   NA 136 09/12/2018 0957   K 4.2 09/12/2018 0957   CL 100 09/12/2018 0957   CO2 21 09/12/2018 0957   GLUCOSE 79 09/12/2018 0957   BUN 16 09/12/2018 0957   CREATININE 0.71 09/12/2018 0957   CALCIUM 9.8 09/12/2018 0957   GFRNONAA 105 09/12/2018 0957   GFRAA 121 09/12/2018 0957   Lab Results  Component Value Date  HGBA1C 5.3 09/12/2018   Lab Results  Component Value Date   INSULIN 8.8 09/12/2018   CBC    Component Value Date/Time   WBC 6.8 09/12/2018 0957   RBC 4.71 09/12/2018 0957   HGB 12.6 09/12/2018 0957   HCT 38.5 09/12/2018 0957   MCV 82 09/12/2018 0957   MCH 26.8 09/12/2018 0957   MCHC 32.7 09/12/2018 0957   RDW 15.3 09/12/2018 0957   LYMPHSABS 1.7 09/12/2018 0957   EOSABS 0.2 09/12/2018 0957   BASOSABS 0.0 09/12/2018 0957   Iron/TIBC/Ferritin/ %Sat    Component Value Date/Time   IRON 47 09/12/2018 0957   TIBC 405 09/12/2018 0957   FERRITIN 17 09/12/2018 0957   IRONPCTSAT 12 (L) 09/12/2018 0957   Lipid Panel     Component Value Date/Time   CHOL 152 09/12/2018 0957   TRIG 48 09/12/2018 0957   HDL 63 09/12/2018 0957   LDLCALC 79 09/12/2018 0957   Hepatic Function Panel     Component Value Date/Time   PROT 6.9 09/12/2018 0957   ALBUMIN 4.4 09/12/2018 0957   AST 13 09/12/2018 0957   ALT 14 09/12/2018 0957   ALKPHOS 92 09/12/2018 0957   BILITOT 0.3 09/12/2018 0957      Component Value Date/Time   TSH 4.350 09/12/2018 0957      Ref. Range  09/12/2018 09:57  Vitamin D, 25-Hydroxy Latest Ref Range: 30.0 - 100.0 ng/mL 67.3    I, [place] CraneJoanne Murray, am acting as Energy managertranscriptionist for Filbert SchilderAlexandria U. Kadolph, MD   I have reviewed the above documentation for accuracy and completeness, and I agree with the above. - Debbra RidingAlexandria Kadolph, MD

## 2019-02-22 ENCOUNTER — Other Ambulatory Visit: Payer: Self-pay

## 2019-02-22 ENCOUNTER — Telehealth (INDEPENDENT_AMBULATORY_CARE_PROVIDER_SITE_OTHER): Payer: BLUE CROSS/BLUE SHIELD | Admitting: Family Medicine

## 2019-02-22 ENCOUNTER — Encounter (INDEPENDENT_AMBULATORY_CARE_PROVIDER_SITE_OTHER): Payer: Self-pay | Admitting: Family Medicine

## 2019-02-22 DIAGNOSIS — M722 Plantar fascial fibromatosis: Secondary | ICD-10-CM

## 2019-02-22 DIAGNOSIS — Z6841 Body Mass Index (BMI) 40.0 and over, adult: Secondary | ICD-10-CM | POA: Diagnosis not present

## 2019-02-23 NOTE — Progress Notes (Signed)
Office: [phone]  /  Fax: [phone] TeleHealth Visit:  Rebecca Dominguez has verbally consented to this TeleHealth visit today. The patient is located at home, the provider is located at the [facility] office. The participants in this visit include the listed provider and patient. The visit was conducted today via face time.  HPI:   Chief Complaint: OBESITY Rebecca Dominguez is here to discuss her progress with her obesity treatment plan. She is on the Category 3 plan and is following her eating plan approximately 90 % of the time. She states she is exercising 0 minutes 0 times per week. Rebecca Dominguez states she is continuing to do well with weight loss and has lost another 2 lbs. Her weight today is 314 lbs.  We were unable to weigh the patient today for this TeleHealth visit. She feels as if she has lost 2 lbs since her last visit. She has lost 8 lbs since starting treatment with [place].  Plantar Fascitis Rebecca Dominguez notes improvement in her foot pain when wearing arch support shoes. She is hesitant to increase her exercise due to worrying about increased foot pain.  ASSESSMENT AND PLAN:  Plantar fasciitis  Class 3 severe obesity with serious comorbidity and body mass index (BMI) of 50.0 to 59.9 in adult, unspecified obesity type (HCC)  PLAN:  Plantar Fascitis    I spent > than 50% of the 25 minute visit on counseling as documented in the note.  Obesity Rebecca Dominguez is currently in the action stage of change. As such, her goal is to continue with weight loss efforts She has agreed to follow the Category 3 plan Rebecca Dominguez has been instructed to work up to a goal of 150 minutes of combined cardio and strengthening exercise per week for weight loss and overall health benefits. We discussed the following Behavioral Modification Strategies today: increasing lean protein intake and work on meal planning and easy cooking plans   Rebecca Dominguez has agreed to follow up with our clinic  in 2 weeks. She was informed of the importance of frequent follow up visits to maximize her success with intensive lifestyle modifications for her multiple health conditions.  ALLERGIES: No Known Allergies  MEDICATIONS: Current Outpatient Medications on File Prior to Visit  Medication Sig Dispense Refill  . aspirin-acetaminophen-caffeine (EXCEDRIN MIGRAINE) 250-250-65 MG tablet Take 1 tablet by mouth every 6 (six) hours as needed for headache.    . cholecalciferol (VITAMIN D) 1000 units tablet Take 1,000 Units by mouth daily.    . cyanocobalamin 500 MCG tablet Take 500 mcg by mouth daily.    . ferrous sulfate 325 (65 FE) MG tablet Take 325 mg by mouth daily with breakfast.    . Glucosamine-Chondroit-Vit C-Mn (GLUCOSAMINE CHONDR 1500 COMPLX) CAPS Take 1 capsule by mouth daily.    Marland Kitchen levonorgestrel (MIRENA) 20 MCG/24HR IUD 1 each by Intrauterine route once.    . meloxicam (MOBIC) 15 MG tablet Take 1 tablet (15 mg total) by mouth daily. 30 tablet 0  . Multiple Vitamins-Minerals (MULTIVITAMIN WOMEN) TABS Take 1 tablet by mouth daily.    . naproxen (NAPROSYN) 500 MG tablet Take 500 mg by mouth 2 (two) times daily with a meal.    . vitamin C (ASCORBIC ACID) 500 MG tablet Take 500 mg by mouth daily.     No current facility-administered medications on file prior to visit.     PAST MEDICAL HISTORY: Past Medical History:  Diagnosis Date  . Anemia    low iron  . Back  pain   . Foot pain    from previous fracture of toe 2010  . Gallbladder problem    removed  . Headache    occasional  . Hypertension    no longer on medications since weight loss  . Joint pain   . Obesity   . Plantar fasciitis   . Vitamin B 12 deficiency   . Vitamin D deficiency     PAST SURGICAL HISTORY: Past Surgical History:  Procedure Laterality Date  . CHOLECYSTECTOMY  2013  . COLONOSCOPY WITH PROPOFOL N/A 03/06/2016   Procedure: COLONOSCOPY WITH PROPOFOL;  Surgeon: Willis ModenaWilliam Outlaw, MD;  [facility];   Service: Endoscopy;  Laterality: N/A;  . gastric bypass surgery  2001  . PILONIDAL CYST EXCISION    . WISDOM TOOTH EXTRACTION      SOCIAL HISTORY: Social History   Tobacco Use  . Smoking status: Never Smoker  . Smokeless tobacco: Never Used  Substance Use Topics  . Alcohol use: Yes    Comment: rare/special occasion  . Drug use: No    FAMILY HISTORY: Family History  Problem Relation Age of Onset  . Breast cancer Mother   . Obesity Mother   . Colon cancer Father   . Diabetes Father   . Obesity Father     ROS: Review of Systems  Constitutional: Positive for weight loss.    PHYSICAL EXAM: Pt in no acute distress  RECENT LABS AND TESTS: BMET    Component Value Date/Time   NA 136 09/12/2018 0957   K 4.2 09/12/2018 0957   CL 100 09/12/2018 0957   CO2 21 09/12/2018 0957   GLUCOSE 79 09/12/2018 0957   BUN 16 09/12/2018 0957   CREATININE 0.71 09/12/2018 0957   CALCIUM 9.8 09/12/2018 0957   GFRNONAA 105 09/12/2018 0957   GFRAA 121 09/12/2018 0957   Lab Results  Component Value Date   HGBA1C 5.3 09/12/2018   Lab Results  Component Value Date   INSULIN 8.8 09/12/2018   CBC    Component Value Date/Time   WBC 6.8 09/12/2018 0957   RBC 4.71 09/12/2018 0957   HGB 12.6 09/12/2018 0957   HCT 38.5 09/12/2018 0957   MCV 82 09/12/2018 0957   MCH 26.8 09/12/2018 0957   MCHC 32.7 09/12/2018 0957   RDW 15.3 09/12/2018 0957   LYMPHSABS 1.7 09/12/2018 0957   EOSABS 0.2 09/12/2018 0957   BASOSABS 0.0 09/12/2018 0957   Iron/TIBC/Ferritin/ %Sat    Component Value Date/Time   IRON 47 09/12/2018 0957   TIBC 405 09/12/2018 0957   FERRITIN 17 09/12/2018 0957   IRONPCTSAT 12 (L) 09/12/2018 0957   Lipid Panel     Component Value Date/Time   CHOL 152 09/12/2018 0957   TRIG 48 09/12/2018 0957   HDL 63 09/12/2018 0957   LDLCALC 79 09/12/2018 0957   Hepatic Function Panel     Component Value Date/Time   PROT 6.9 09/12/2018 0957   ALBUMIN 4.4 09/12/2018 0957    AST 13 09/12/2018 0957   ALT 14 09/12/2018 0957   ALKPHOS 92 09/12/2018 0957   BILITOT 0.3 09/12/2018 0957      Component Value Date/Time   TSH 4.350 09/12/2018 0957      I, Burt KnackSharon Martin, am acting as transcriptionist for Quillian Quincearen Ashia Dehner, MD I have reviewed the above documentation for accuracy and completeness, and I agree with the above. -Quillian Quincearen Mirelle Biskup, MD

## 2019-03-09 ENCOUNTER — Telehealth (INDEPENDENT_AMBULATORY_CARE_PROVIDER_SITE_OTHER): Payer: BLUE CROSS/BLUE SHIELD | Admitting: Family Medicine

## 2019-03-09 ENCOUNTER — Other Ambulatory Visit: Payer: Self-pay

## 2019-03-09 ENCOUNTER — Encounter (INDEPENDENT_AMBULATORY_CARE_PROVIDER_SITE_OTHER): Payer: Self-pay | Admitting: Family Medicine

## 2019-03-09 DIAGNOSIS — E559 Vitamin D deficiency, unspecified: Secondary | ICD-10-CM | POA: Diagnosis not present

## 2019-03-09 DIAGNOSIS — E8881 Metabolic syndrome: Secondary | ICD-10-CM

## 2019-03-09 DIAGNOSIS — Z6841 Body Mass Index (BMI) 40.0 and over, adult: Secondary | ICD-10-CM | POA: Diagnosis not present

## 2019-03-14 NOTE — Progress Notes (Signed)
Office: [phone]  /  Fax: [phone] TeleHealth Visit:  Evonnie PatKatherine D Dominguez has verbally consented to this TeleHealth visit today. The patient is located at home, the provider is located at the [facility] office. The participants in this visit include the listed provider and patient. The visit was conducted today via face time.  HPI:   Chief Complaint: OBESITY Rebecca Dominguez is here to discuss her progress with her obesity treatment plan. She is on the Category 3 plan and is following her eating plan approximately 80 % of the time. She states she is exercising 0 minutes 0 times per week. Rebecca Dominguez has been working from home since 2016, so it is not a huge difference since pandemic. She has been using whipped Peter Pan peanut butter recently. Her weight is of 316.2 lbs today (last reported weight of 314 lbs). She is interested in options of how to get off the meal plan.  We were unable to weigh the patient today for this TeleHealth visit. She feels as if she has gained 2 lbs since her last visit. She has lost 8 lbs since starting treatment with us.  Insulin Resistance Rebecca Dominguez has a diagnosis of insulin resistance based on her elevated fasting insulin level >5. Last Hgb A1c was of 5.3 and insulin of 8.8. Although Marliss's blood glucose readings are still under good control, insulin resistance puts her at greater risk of metabolic syndrome and diabetes. She is not taking metformin currently and notes occasional carbohydrate cravings. She continues to work on diet and exercise to decrease risk of diabetes.  Vitamin D Deficiency Rebecca Dominguez has a diagnosis of vitamin D deficiency. She is currently taking OTC Vit D daily and denies nausea, vomiting or muscle weakness.  ASSESSMENT AND PLAN:  Insulin resistance  Vitamin D deficiency  Class 3 severe obesity with serious comorbidity and body mass index (BMI) of 50.0 to 59.9 in adult, unspecified obesity type (HCC)  PLAN:   Insulin Resistance Rebecca Dominguez will continue to work on weight loss, exercise, and decreasing simple carbohydrates in her diet to help decrease the risk of diabetes. We dicussed metformin including benefits and risks. She was informed that eating too many simple carbohydrates or too many calories at one sitting increases the likelihood of GI side effects. Rebecca Dominguez declined metformin for now and prescription was not written today. We will repeat labs at her first in office appointment. Rebecca Dominguez agrees to follow up with our clinic in 3 weeks as directed to monitor her progress.  Vitamin D Deficiency Rebecca Dominguez was informed that low vitamin D levels contributes to fatigue and are associated with obesity, breast, and colon cancer. Rebecca Dominguez agrees to continue taking OTC Vit D 1,000 IU daily and will follow up for routine testing of vitamin D, at least 2-3 times per year. She was informed of the risk of over-replacement of vitamin D and agrees to not increase her dose unless she discusses this with us first. Rebecca Dominguez agrees to follow up with our clinic in 3 weeks.  Obesity Rebecca Dominguez is currently in the action stage of change. As such, her goal is to continue with weight loss efforts She has agreed to follow the Category 3 plan Rebecca Dominguez has been instructed to work up to a goal of 150 minutes of combined cardio and strengthening exercise per week for weight loss and overall health benefits. We discussed the following Behavioral Modification Strategies today: increasing lean protein intake, increasing vegetables and work on meal planning and easy cooking plans, keeping healthy foods  in the home, and planning for success   Helen has agreed to follow up with our clinic in 3 weeks. She was informed of the importance of frequent follow up visits to maximize her success with intensive lifestyle modifications for her multiple health conditions.  ALLERGIES: No Known Allergies  MEDICATIONS: Current  Outpatient Medications on File Prior to Visit  Medication Sig Dispense Refill  . aspirin-acetaminophen-caffeine (EXCEDRIN MIGRAINE) 250-250-65 MG tablet Take 1 tablet by mouth every 6 (six) hours as needed for headache.    . cholecalciferol (VITAMIN D) 1000 units tablet Take 1,000 Units by mouth daily.    . cyanocobalamin 500 MCG tablet Take 500 mcg by mouth daily.    . ferrous sulfate 325 (65 FE) MG tablet Take 325 mg by mouth daily with breakfast.    . Glucosamine-Chondroit-Vit C-Mn (GLUCOSAMINE CHONDR 1500 COMPLX) CAPS Take 1 capsule by mouth daily.    Marland Kitchen levonorgestrel (MIRENA) 20 MCG/24HR IUD 1 each by Intrauterine route once.    . Multiple Vitamins-Minerals (MULTIVITAMIN WOMEN) TABS Take 1 tablet by mouth daily.    . naproxen (NAPROSYN) 500 MG tablet Take 500 mg by mouth 2 (two) times daily with a meal.    . vitamin C (ASCORBIC ACID) 500 MG tablet Take 500 mg by mouth daily.    . meloxicam (MOBIC) 15 MG tablet Take 1 tablet (15 mg total) by mouth daily. (Patient not taking: Reported on 03/09/2019) 30 tablet 0   No current facility-administered medications on file prior to visit.     PAST MEDICAL HISTORY: Past Medical History:  Diagnosis Date  . Anemia    low iron  . Back pain   . Foot pain    from previous fracture of toe 2010  . Gallbladder problem    removed  . Headache    occasional  . Hypertension    no longer on medications since weight loss  . Joint pain   . Obesity   . Plantar fasciitis   . Vitamin B 12 deficiency   . Vitamin D deficiency     PAST SURGICAL HISTORY: Past Surgical History:  Procedure Laterality Date  . CHOLECYSTECTOMY  2013  . COLONOSCOPY WITH PROPOFOL N/A 03/06/2016   Procedure: COLONOSCOPY WITH PROPOFOL;  Surgeon: Willis Modena, MD;  Location: [facility] ENDOSCOPY;  Service: Endoscopy;  Laterality: N/A;  . gastric bypass surgery  2001  . PILONIDAL CYST EXCISION    . WISDOM TOOTH EXTRACTION      SOCIAL HISTORY: Social History   Tobacco Use  .  Smoking status: Never Smoker  . Smokeless tobacco: Never Used  Substance Use Topics  . Alcohol use: Yes    Comment: rare/special occasion  . Drug use: No    FAMILY HISTORY: Family History  Problem Relation Age of Onset  . Breast cancer Mother   . Obesity Mother   . Colon cancer Father   . Diabetes Father   . Obesity Father     ROS: Review of Systems  Constitutional: Negative for weight loss.  Gastrointestinal: Negative for nausea and vomiting.  Musculoskeletal:       Negative muscle weakness    PHYSICAL EXAM: Pt in no acute distress  RECENT LABS AND TESTS: BMET    Component Value Date/Time   NA 136 09/12/2018 0957   K 4.2 09/12/2018 0957   CL 100 09/12/2018 0957   CO2 21 09/12/2018 0957   GLUCOSE 79 09/12/2018 0957   BUN 16 09/12/2018 0957   CREATININE 0.71 09/12/2018 0957  CALCIUM 9.8 09/12/2018 0957   GFRNONAA 105 09/12/2018 0957   GFRAA 121 09/12/2018 0957   Lab Results  Component Value Date   HGBA1C 5.3 09/12/2018   Lab Results  Component Value Date   INSULIN 8.8 09/12/2018   CBC    Component Value Date/Time   WBC 6.8 09/12/2018 0957   RBC 4.71 09/12/2018 0957   HGB 12.6 09/12/2018 0957   HCT 38.5 09/12/2018 0957   MCV 82 09/12/2018 0957   MCH 26.8 09/12/2018 0957   MCHC 32.7 09/12/2018 0957   RDW 15.3 09/12/2018 0957   LYMPHSABS 1.7 09/12/2018 0957   EOSABS 0.2 09/12/2018 0957   BASOSABS 0.0 09/12/2018 0957   Iron/TIBC/Ferritin/ %Sat    Component Value Date/Time   IRON 47 09/12/2018 0957   TIBC 405 09/12/2018 0957   FERRITIN 17 09/12/2018 0957   IRONPCTSAT 12 (L) 09/12/2018 0957   Lipid Panel     Component Value Date/Time   CHOL 152 09/12/2018 0957   TRIG 48 09/12/2018 0957   HDL 63 09/12/2018 0957   LDLCALC 79 09/12/2018 0957   Hepatic Function Panel     Component Value Date/Time   PROT 6.9 09/12/2018 0957   ALBUMIN 4.4 09/12/2018 0957   AST 13 09/12/2018 0957   ALT 14 09/12/2018 0957   ALKPHOS 92 09/12/2018 0957    BILITOT 0.3 09/12/2018 0957      Component Value Date/Time   TSH 4.350 09/12/2018 0957      I, Trixie Dredge, am acting as transcriptionist for Ilene Qua, MD  I have reviewed the above documentation for accuracy and completeness, and I agree with the above. - Ilene Qua, MD

## 2019-03-28 ENCOUNTER — Ambulatory Visit (INDEPENDENT_AMBULATORY_CARE_PROVIDER_SITE_OTHER): Payer: BLUE CROSS/BLUE SHIELD | Admitting: Family Medicine

## 2019-03-29 ENCOUNTER — Telehealth (INDEPENDENT_AMBULATORY_CARE_PROVIDER_SITE_OTHER): Payer: BLUE CROSS/BLUE SHIELD | Admitting: Family Medicine

## 2019-03-29 ENCOUNTER — Encounter (INDEPENDENT_AMBULATORY_CARE_PROVIDER_SITE_OTHER): Payer: Self-pay | Admitting: Family Medicine

## 2019-03-29 ENCOUNTER — Other Ambulatory Visit: Payer: Self-pay

## 2019-03-29 DIAGNOSIS — Z6841 Body Mass Index (BMI) 40.0 and over, adult: Secondary | ICD-10-CM

## 2019-03-29 DIAGNOSIS — E8881 Metabolic syndrome: Secondary | ICD-10-CM

## 2019-03-29 NOTE — Progress Notes (Signed)
Office: [phone]  /  Fax: [phone] TeleHealth Visit:  Rebecca Dominguez has verbally consented to this TeleHealth visit today. The patient is located at home, the provider is located at the [facility] office. The participants in this visit include the listed provider and patient. The visit was conducted today via FaceTime.  HPI:   Chief Complaint: OBESITY Rebecca Dominguez is here to discuss her progress with her obesity treatment plan. She is on the Category 3 plan and is following her eating plan approximately 75-80% of the time. She states she is exercising 0 minutes 0 times per week. Notnamed feels she has lost 2 lbs since her last visit and reports her weight today to be 314 lbs. She is not exercising but is looking forward to starting walking when the weather starts to cool down. We were unable to weigh the patient today for this TeleHealth visit. She states her weight today is 314 lbs. She has lost 8 lbs since starting treatment with [place].  Insulin Resistance Rebecca Dominguez has a diagnosis of insulin resistance based on her elevated fasting insulin level >5. Although Rebecca Dominguez blood glucose readings are still under good control, insulin resistance puts her at greater risk of metabolic syndrome and diabetes. Rebecca Dominguez is working on diet and exercise but is still struggling with polyphagia. She is not on metformin.  ASSESSMENT AND PLAN:  Insulin resistance  Class 3 severe obesity with serious comorbidity and body mass index (BMI) of 50.0 to 59.9 in adult, unspecified obesity type (HCC)  PLAN:  Insulin Resistance Rebecca Dominguez will continue to work on weight loss, exercise, and decreasing simple carbohydrates in her diet to help decrease the risk of diabetes. We dicussed metformin including benefits and risks. She was informed that eating too many simple carbohydrates or too many calories at one sitting increases the likelihood of GI side effects. Rebecca Dominguez was offered to  start liraglutide and she stated she wants to "research" it first. We will discuss this option further at her next visit in 2 weeks.   I spent > than 50% of the 15 minute visit on counseling as documented in the note.  Obesity Rebecca Dominguez is currently in the action stage of change. As such, her goal is to continue with weight loss efforts. She has agreed to follow the Category 3 plan. Rebecca Dominguez has been instructed to work up to a goal of 150 minutes of combined cardio and strengthening exercise per week for weight loss and overall health benefits. We discussed the following Behavioral Modification Strategies today: work on meal planning and easy cooking plans, and emotional eating strategies.  Rebecca Dominguez has agreed to follow-up with our clinic in 2 weeks. She was informed of the importance of frequent follow-up visits to maximize her success with intensive lifestyle modifications for her multiple health conditions.  ALLERGIES: No Known Allergies  MEDICATIONS: Current Outpatient Medications on File Prior to Visit  Medication Sig Dispense Refill  . aspirin-acetaminophen-caffeine (EXCEDRIN MIGRAINE) 250-250-65 MG tablet Take 1 tablet by mouth every 6 (six) hours as needed for headache.    . cholecalciferol (VITAMIN D) 1000 units tablet Take 1,000 Units by mouth daily.    . cyanocobalamin 500 MCG tablet Take 500 mcg by mouth daily.    . ferrous sulfate 325 (65 FE) MG tablet Take 325 mg by mouth daily with breakfast.    . Glucosamine-Chondroit-Vit C-Mn (GLUCOSAMINE CHONDR 1500 COMPLX) CAPS Take 1 capsule by mouth daily.    Rebecca Dominguez Kitchen levonorgestrel (MIRENA) 20 MCG/24HR IUD 1 each by  Intrauterine route once.    . Multiple Vitamins-Minerals (MULTIVITAMIN WOMEN) TABS Take 1 tablet by mouth daily.    . naproxen (NAPROSYN) 500 MG tablet Take 500 mg by mouth 2 (two) times daily with a meal.    . vitamin C (ASCORBIC ACID) 500 MG tablet Take 500 mg by mouth daily.     No current facility-administered medications  on file prior to visit.     PAST MEDICAL HISTORY: Past Medical History:  Diagnosis Date  . Anemia    low iron  . Dominguez pain   . Foot pain    from previous fracture of toe 2010  . Gallbladder problem    removed  . Headache    occasional  . Hypertension    no longer on medications since weight loss  . Joint pain   . Obesity   . Plantar fasciitis   . Vitamin B 12 deficiency   . Vitamin D deficiency     PAST SURGICAL HISTORY: Past Surgical History:  Procedure Laterality Date  . CHOLECYSTECTOMY  2013  . COLONOSCOPY WITH PROPOFOL N/A 03/06/2016   Procedure: COLONOSCOPY WITH PROPOFOL;  Surgeon: Willis ModenaWilliam Outlaw, MD;  [facility];  Service: Endoscopy;  Laterality: N/A;  . gastric bypass surgery  2001  . PILONIDAL CYST EXCISION    . WISDOM TOOTH EXTRACTION      SOCIAL HISTORY: Social History   Tobacco Use  . Smoking status: Never Smoker  . Smokeless tobacco: Never Used  Substance Use Topics  . Alcohol use: Yes    Comment: rare/special occasion  . Drug use: No    FAMILY HISTORY: Family History  Problem Relation Age of Onset  . Breast cancer Mother   . Obesity Mother   . Colon cancer Father   . Diabetes Father   . Obesity Father    ROS: Review of Systems  Endo/Heme/Allergies:       Positive for polyphagia.   PHYSICAL EXAM: Pt in no acute distress  RECENT LABS AND TESTS: BMET    Component Value Date/Time   NA 136 09/12/2018 0957   K 4.2 09/12/2018 0957   CL 100 09/12/2018 0957   CO2 21 09/12/2018 0957   GLUCOSE 79 09/12/2018 0957   BUN 16 09/12/2018 0957   CREATININE 0.71 09/12/2018 0957   CALCIUM 9.8 09/12/2018 0957   GFRNONAA 105 09/12/2018 0957   GFRAA 121 09/12/2018 0957   Lab Results  Component Value Date   HGBA1C 5.3 09/12/2018   Lab Results  Component Value Date   INSULIN 8.8 09/12/2018   CBC    Component Value Date/Time   WBC 6.8 09/12/2018 0957   RBC 4.71 09/12/2018 0957   HGB 12.6 09/12/2018 0957   HCT 38.5 09/12/2018  0957   MCV 82 09/12/2018 0957   MCH 26.8 09/12/2018 0957   MCHC 32.7 09/12/2018 0957   RDW 15.3 09/12/2018 0957   LYMPHSABS 1.7 09/12/2018 0957   EOSABS 0.2 09/12/2018 0957   BASOSABS 0.0 09/12/2018 0957   Iron/TIBC/Ferritin/ %Sat    Component Value Date/Time   IRON 47 09/12/2018 0957   TIBC 405 09/12/2018 0957   FERRITIN 17 09/12/2018 0957   IRONPCTSAT 12 (L) 09/12/2018 0957   Lipid Panel     Component Value Date/Time   CHOL 152 09/12/2018 0957   TRIG 48 09/12/2018 0957   HDL 63 09/12/2018 0957   LDLCALC 79 09/12/2018 0957   Hepatic Function Panel     Component Value Date/Time   PROT 6.9  09/12/2018 0957   ALBUMIN 4.4 09/12/2018 0957   AST 13 09/12/2018 0957   ALT 14 09/12/2018 0957   ALKPHOS 92 09/12/2018 0957   BILITOT 0.3 09/12/2018 0957      Component Value Date/Time   TSH 4.350 09/12/2018 0957   Results for CHASITI, WADDINGTON (MRN [id number]) as of 03/29/2019 14:45  Ref. Range 09/12/2018 09:57  Vitamin D, 25-Hydroxy Latest Ref Range: 30.0 - 100.0 ng/mL 67.3   I, Rebecca Dominguez, am acting as Location manager for Dennard Nip, MD I have reviewed the above documentation for accuracy and completeness, and I agree with the above. -Dennard Nip, MD

## 2019-04-12 ENCOUNTER — Encounter (INDEPENDENT_AMBULATORY_CARE_PROVIDER_SITE_OTHER): Payer: Self-pay | Admitting: Family Medicine

## 2019-04-12 ENCOUNTER — Telehealth (INDEPENDENT_AMBULATORY_CARE_PROVIDER_SITE_OTHER): Payer: BLUE CROSS/BLUE SHIELD | Admitting: Family Medicine

## 2019-04-12 ENCOUNTER — Other Ambulatory Visit: Payer: Self-pay

## 2019-04-12 DIAGNOSIS — Z6841 Body Mass Index (BMI) 40.0 and over, adult: Secondary | ICD-10-CM

## 2019-04-12 DIAGNOSIS — E559 Vitamin D deficiency, unspecified: Secondary | ICD-10-CM

## 2019-04-13 NOTE — Progress Notes (Signed)
Office: [phone]  /  Fax: [phone] TeleHealth Visit:  Rebecca Dominguez has verbally consented to this TeleHealth visit today. The patient is located at home, the provider is located at the [facility] office. The participants in this visit include the listed provider and patient. The visit was conducted today via FaceTime.  HPI:   Chief Complaint: OBESITY Rebecca Dominguez is here to discuss her progress with her obesity treatment plan. She is on the Category 3 plan and is following her eating plan approximately 80-85% of the time. She states she is walking 20-30 minutes 1-2 times per week. Rebecca Dominguez continues to do well with weight loss. Her weight this a.m. at home was 311, which is down 3 lbs from her last visit. She is exercising better and still working from home. She feels she is meal planning well. We were unable to weigh the patient today for this TeleHealth visit. She states weight this morning was 311 lbs. She has lost 8 lbs since starting treatment with [place].  Vitamin D deficiency Rebecca Dominguez has a diagnosis of Vitamin D deficiency. Last Vitamin D level was at goal. She is currently stable on OTC Vit D 1,000 IU daily and denies nausea, vomiting or muscle weakness.  ASSESSMENT AND PLAN:  Vitamin D deficiency  Class 3 severe obesity with serious comorbidity and body mass index (BMI) of 50.0 to 59.9 in adult, unspecified obesity type (HCC)  PLAN:  Vitamin D Deficiency Rebecca Dominguez was informed that low Vitamin D levels contributes to fatigue and are associated with obesity, breast, and colon cancer. She agrees to continue taking OTC Vitamin D as is and will follow-up for routine testing of Vitamin D in 1-2 months. She was informed of the risk of over-replacement of Vitamin D and agrees to not increase her dose unless she discusses this with [place] first. Rebecca Dominguez agrees to follow-up with our clinic in 3 weeks.  I spent > than 50% of the 15 minute visit on counseling as  documented in the note.  Obesity Rebecca Dominguez is currently in the action stage of change. As such, her goal is to continue with weight loss efforts. She has agreed to follow the Category 3 plan. Rebecca Dominguez has been instructed to work up to a goal of 150 minutes of combined cardio and strengthening exercise per week for weight loss and overall health benefits. We discussed the following Behavioral Modification Strategies today: increase H20 intake and ways to avoid boredom eating.  Rebecca Dominguez has agreed to follow-up with our clinic in 3 weeks. She was informed of the importance of frequent follow-up visits to maximize her success with intensive lifestyle modifications for her multiple health conditions.  ALLERGIES: No Known Allergies  MEDICATIONS: Current Outpatient Medications on File Prior to Visit  Medication Sig Dispense Refill   aspirin-acetaminophen-caffeine (EXCEDRIN MIGRAINE) 250-250-65 MG tablet Take 1 tablet by mouth every 6 (six) hours as needed for headache.     cholecalciferol (VITAMIN D) 1000 units tablet Take 1,000 Units by mouth daily.     cyanocobalamin 500 MCG tablet Take 500 mcg by mouth daily.     ferrous sulfate 325 (65 FE) MG tablet Take 325 mg by mouth daily with breakfast.     Glucosamine-Chondroit-Vit C-Mn (GLUCOSAMINE CHONDR 1500 COMPLX) CAPS Take 1 capsule by mouth daily.     levonorgestrel (MIRENA) 20 MCG/24HR IUD 1 each by Intrauterine route once.     Multiple Vitamins-Minerals (MULTIVITAMIN WOMEN) TABS Take 1 tablet by mouth daily.     naproxen (NAPROSYN)  500 MG tablet Take 500 mg by mouth 2 (two) times daily with a meal.     vitamin C (ASCORBIC ACID) 500 MG tablet Take 500 mg by mouth daily.     No current facility-administered medications on file prior to visit.     PAST MEDICAL HISTORY: Past Medical History:  Diagnosis Date   Anemia    low iron   Back pain    Foot pain    from previous fracture of toe 2010   Gallbladder problem    removed    Headache    occasional   Hypertension    no longer on medications since weight loss   Joint pain    Obesity    Plantar fasciitis    Vitamin B 12 deficiency    Vitamin D deficiency     PAST SURGICAL HISTORY: Past Surgical History:  Procedure Laterality Date   CHOLECYSTECTOMY  2013   COLONOSCOPY WITH PROPOFOL N/A 03/06/2016   Procedure: COLONOSCOPY WITH PROPOFOL;  Surgeon: Willis ModenaWilliam Outlaw, MD;  Location[facility];  Service: Endoscopy;  Laterality: N/A;   gastric bypass surgery  2001   PILONIDAL CYST EXCISION     WISDOM TOOTH EXTRACTION      SOCIAL HISTORY: Social History   Tobacco Use   Smoking status: Never Smoker   Smokeless tobacco: Never Used  Substance Use Topics   Alcohol use: Yes    Comment: rare/special occasion   Drug use: No    FAMILY HISTORY: Family History  Problem Relation Age of Onset   Breast cancer Mother    Obesity Mother    Colon cancer Father    Diabetes Father    Obesity Father    ROS: Review of Systems  Gastrointestinal: Negative for nausea and vomiting.  Musculoskeletal:       Negative for muscle weakness.   PHYSICAL EXAM: Pt in no acute distress  RECENT LABS AND TESTS: BMET    Component Value Date/Time   NA 136 09/12/2018 0957   K 4.2 09/12/2018 0957   CL 100 09/12/2018 0957   CO2 21 09/12/2018 0957   GLUCOSE 79 09/12/2018 0957   BUN 16 09/12/2018 0957   CREATININE 0.71 09/12/2018 0957   CALCIUM 9.8 09/12/2018 0957   GFRNONAA 105 09/12/2018 0957   GFRAA 121 09/12/2018 0957   Lab Results  Component Value Date   HGBA1C 5.3 09/12/2018   Lab Results  Component Value Date   INSULIN 8.8 09/12/2018   CBC    Component Value Date/Time   WBC 6.8 09/12/2018 0957   RBC 4.71 09/12/2018 0957   HGB 12.6 09/12/2018 0957   HCT 38.5 09/12/2018 0957   MCV 82 09/12/2018 0957   MCH 26.8 09/12/2018 0957   MCHC 32.7 09/12/2018 0957   RDW 15.3 09/12/2018 0957   LYMPHSABS 1.7 09/12/2018 0957   EOSABS 0.2  09/12/2018 0957   BASOSABS 0.0 09/12/2018 0957   Iron/TIBC/Ferritin/ %Sat    Component Value Date/Time   IRON 47 09/12/2018 0957   TIBC 405 09/12/2018 0957   FERRITIN 17 09/12/2018 0957   IRONPCTSAT 12 (L) 09/12/2018 0957   Lipid Panel     Component Value Date/Time   CHOL 152 09/12/2018 0957   TRIG 48 09/12/2018 0957   HDL 63 09/12/2018 0957   LDLCALC 79 09/12/2018 0957   Hepatic Function Panel     Component Value Date/Time   PROT 6.9 09/12/2018 0957   ALBUMIN 4.4 09/12/2018 0957   AST 13 09/12/2018 0957  ALT 14 09/12/2018 0957   ALKPHOS 92 09/12/2018 0957   BILITOT 0.3 09/12/2018 0957      Component Value Date/Time   TSH 4.350 09/12/2018 0957   Results for DANAYAH, SMYRE (MRN [id number]) as of 04/13/2019 11:40  Ref. Range 09/12/2018 09:57  Vitamin D, 25-Hydroxy Latest Ref Range: 30.0 - 100.0 ng/mL 67.3   I, Michaelene Song, am acting as Location manager for Dennard Nip, MD  I have reviewed the above documentation for accuracy and completeness, and I agree with the above. -Dennard Nip, MD

## 2019-05-03 ENCOUNTER — Other Ambulatory Visit: Payer: Self-pay

## 2019-05-03 ENCOUNTER — Telehealth (INDEPENDENT_AMBULATORY_CARE_PROVIDER_SITE_OTHER): Payer: BLUE CROSS/BLUE SHIELD | Admitting: Family Medicine

## 2019-05-03 ENCOUNTER — Encounter (INDEPENDENT_AMBULATORY_CARE_PROVIDER_SITE_OTHER): Payer: Self-pay | Admitting: Family Medicine

## 2019-05-03 DIAGNOSIS — E8881 Metabolic syndrome: Secondary | ICD-10-CM

## 2019-05-03 DIAGNOSIS — Z6841 Body Mass Index (BMI) 40.0 and over, adult: Secondary | ICD-10-CM

## 2019-05-08 NOTE — Progress Notes (Signed)
Office: [phone]  /  Fax: [phone] TeleHealth Visit:  Rebecca Dominguez has verbally consented to this TeleHealth visit today. The patient is located at home, the provider is located at the [employer] [facility] office. The participants in this visit include the listed provider and patient. The visit was conducted today via face time.  HPI:   Chief Complaint: OBESITY Rebecca Dominguez is here to discuss her progress with her obesity treatment plan. She is on the Category 3 plan and is following her eating plan approximately 65 % of the time. She states she is doing some walking and home projects. Aricka feels she has gained a couple of lbs since our last visit, but suspects it is water weight and hormonal changes. She had some celebration eating for her 10 year anniversary but states she has gotten back on track.  We were unable to weigh the patient today for this TeleHealth visit. She feels as if she has gained weight since her last visit. She has lost 8 lbs since starting treatment with [place].  Insulin Resistance Rebecca Dominguez has a diagnosis of insulin resistance based on her elevated fasting insulin level >5. Although Rebecca Dominguez's blood glucose readings are still under good control, insulin resistance puts her at greater risk of metabolic syndrome and diabetes. She is doing well controlling with diet overall. She notes decrease in polyphagia and denies hypoglycemia. She did increase simple carbohydrates in the last few days, but already has gotten back on track.  ASSESSMENT AND PLAN:  Insulin resistance  Class 3 severe obesity with serious comorbidity and body mass index (BMI) of 50.0 to 59.9 in adult, unspecified obesity type ([place])  PLAN:  Insulin Resistance Rebecca Dominguez will continue to work on weight loss, diet, exercise, and decreasing simple carbohydrates in her diet to help decrease the risk of diabetes. We dicussed metformin including benefits and risks. She was informed that  eating too many simple carbohydrates or too many calories at one sitting increases the likelihood of GI side effects. We will recheck labs as soon as she can get back in the office. Rebecca Dominguez agrees to follow up with [place] as directed to monitor her progress.  I spent > than 50% of the 15 minute visit on counseling as documented in the note.  Obesity Rebecca Dominguez is currently in the action stage of change. As such, her goal is to continue with weight loss efforts She has agreed to follow the Category 3 plan Rebecca Dominguez has been instructed to work up to a goal of 150 minutes of combined cardio and strengthening exercise per week for weight loss and overall health benefits. We discussed the following Behavioral Modification Strategies today: decreasing simple carbohydrates, work on meal planning and easy cooking plans, and keeping healthy foods in the home   Rebecca Dominguez has agreed to follow up with our clinic in 2 weeks. She was informed of the importance of frequent follow up visits to maximize her success with intensive lifestyle modifications for her multiple health conditions.  ALLERGIES: No Known Allergies  MEDICATIONS: Current Outpatient Medications on File Prior to Visit  Medication Sig Dispense Refill  . aspirin-acetaminophen-caffeine (EXCEDRIN MIGRAINE) 250-250-65 MG tablet Take 1 tablet by mouth every 6 (six) hours as needed for headache.    . cholecalciferol (VITAMIN D) 1000 units tablet Take 1,000 Units by mouth daily.    . cyanocobalamin 500 MCG tablet Take 500 mcg by mouth daily.    . ferrous sulfate 325 (65 FE) MG tablet Take 325 mg by mouth daily  with breakfast.    . Glucosamine-Chondroit-Vit C-Mn (GLUCOSAMINE CHONDR 1500 COMPLX) CAPS Take 1 capsule by mouth daily.    Marland Kitchen. levonorgestrel (MIRENA) 20 MCG/24HR IUD 1 each by Intrauterine route once.    . Multiple Vitamins-Minerals (MULTIVITAMIN WOMEN) TABS Take 1 tablet by mouth daily.    . naproxen (NAPROSYN) 500 MG tablet Take 500 mg by  mouth 2 (two) times daily with a meal.    . vitamin C (ASCORBIC ACID) 500 MG tablet Take 500 mg by mouth daily.     No current facility-administered medications on file prior to visit.     PAST MEDICAL HISTORY: Past Medical History:  Diagnosis Date  . Anemia    low iron  . Back pain   . Foot pain    from previous fracture of toe 2010  . Gallbladder problem    removed  . Headache    occasional  . Hypertension    no longer on medications since weight loss  . Joint pain   . Obesity   . Plantar fasciitis   . Vitamin B 12 deficiency   . Vitamin D deficiency     PAST SURGICAL HISTORY: Past Surgical History:  Procedure Laterality Date  . CHOLECYSTECTOMY  2013  . COLONOSCOPY WITH PROPOFOL N/A 03/06/2016   Procedure: COLONOSCOPY WITH PROPOFOL;  Surgeon: Willis ModenaWilliam Outlaw, MD;  [facility];  Service: Endoscopy;  Laterality: N/A;  . gastric bypass surgery  2001  . PILONIDAL CYST EXCISION    . WISDOM TOOTH EXTRACTION      SOCIAL HISTORY: Social History   Tobacco Use  . Smoking status: Never Smoker  . Smokeless tobacco: Never Used  Substance Use Topics  . Alcohol use: Yes    Comment: rare/special occasion  . Drug use: No    FAMILY HISTORY: Family History  Problem Relation Age of Onset  . Breast cancer Mother   . Obesity Mother   . Colon cancer Father   . Diabetes Father   . Obesity Father     ROS: Review of Systems  Constitutional: Negative for weight loss.  Endo/Heme/Allergies:       Positive polyphagia Negative hypoglycemia    PHYSICAL EXAM: Pt in no acute distress  RECENT LABS AND TESTS: BMET    Component Value Date/Time   NA 136 09/12/2018 0957   K 4.2 09/12/2018 0957   CL 100 09/12/2018 0957   CO2 21 09/12/2018 0957   GLUCOSE 79 09/12/2018 0957   BUN 16 09/12/2018 0957   CREATININE 0.71 09/12/2018 0957   CALCIUM 9.8 09/12/2018 0957   GFRNONAA 105 09/12/2018 0957   GFRAA 121 09/12/2018 0957   Lab Results  Component Value Date    HGBA1C 5.3 09/12/2018   Lab Results  Component Value Date   INSULIN 8.8 09/12/2018   CBC    Component Value Date/Time   WBC 6.8 09/12/2018 0957   RBC 4.71 09/12/2018 0957   HGB 12.6 09/12/2018 0957   HCT 38.5 09/12/2018 0957   MCV 82 09/12/2018 0957   MCH 26.8 09/12/2018 0957   MCHC 32.7 09/12/2018 0957   RDW 15.3 09/12/2018 0957   LYMPHSABS 1.7 09/12/2018 0957   EOSABS 0.2 09/12/2018 0957   BASOSABS 0.0 09/12/2018 0957   Iron/TIBC/Ferritin/ %Sat    Component Value Date/Time   IRON 47 09/12/2018 0957   TIBC 405 09/12/2018 0957   FERRITIN 17 09/12/2018 0957   IRONPCTSAT 12 (L) 09/12/2018 0957   Lipid Panel     Component Value Date/Time  CHOL 152 09/12/2018 0957   TRIG 48 09/12/2018 0957   HDL 63 09/12/2018 0957   LDLCALC 79 09/12/2018 0957   Hepatic Function Panel     Component Value Date/Time   PROT 6.9 09/12/2018 0957   ALBUMIN 4.4 09/12/2018 0957   AST 13 09/12/2018 0957   ALT 14 09/12/2018 0957   ALKPHOS 92 09/12/2018 0957   BILITOT 0.3 09/12/2018 0957      Component Value Date/Time   TSH 4.350 09/12/2018 0957      I, Burt Knack, am acting as transcriptionist for Quillian Quince, MD I have reviewed the above documentation for accuracy and completeness, and I agree with the above. -Quillian Quince, MD

## 2019-05-23 ENCOUNTER — Other Ambulatory Visit: Payer: Self-pay | Admitting: Family Medicine

## 2019-05-23 ENCOUNTER — Other Ambulatory Visit: Payer: Self-pay | Admitting: *Deleted

## 2019-05-23 DIAGNOSIS — Z1231 Encounter for screening mammogram for malignant neoplasm of breast: Secondary | ICD-10-CM

## 2019-05-24 ENCOUNTER — Other Ambulatory Visit: Payer: Self-pay

## 2019-05-24 ENCOUNTER — Encounter (INDEPENDENT_AMBULATORY_CARE_PROVIDER_SITE_OTHER): Payer: Self-pay | Admitting: Family Medicine

## 2019-05-24 ENCOUNTER — Telehealth (INDEPENDENT_AMBULATORY_CARE_PROVIDER_SITE_OTHER): Payer: BLUE CROSS/BLUE SHIELD | Admitting: Family Medicine

## 2019-05-24 DIAGNOSIS — F419 Anxiety disorder, unspecified: Secondary | ICD-10-CM

## 2019-05-24 DIAGNOSIS — E559 Vitamin D deficiency, unspecified: Secondary | ICD-10-CM

## 2019-05-24 DIAGNOSIS — Z6841 Body Mass Index (BMI) 40.0 and over, adult: Secondary | ICD-10-CM | POA: Diagnosis not present

## 2019-05-24 NOTE — Progress Notes (Signed)
Office: [phone]  /  Fax: [phone] TeleHealth Visit:  Rebecca Dominguez has verbally consented to this TeleHealth visit today. The patient is located at home, the provider is located at the [employer] [facility] office. The participants in this visit include the listed provider and patient. The visit was conducted today via FaceTime.  HPI:   Chief Complaint: OBESITY Rebecca Dominguez is here to discuss her progress with her obesity treatment plan. She is on the Category 3 plan and is following her eating plan approximately 75-80% of the time. She states she is exercising 0 minutes 0 times per week. Rebecca Dominguez states she has maintained her weight since her last visit. She is not exercising and knows she should but is not ready to start. She works on her eating plan. We were unable to weigh the patient today for this TeleHealth visit. She feels as if she has maintained her weight since her last visit. She has lost 8 lbs since starting treatment with [place].  Anxiety Arthea is very worried about pending election results. She notes anxiety has increased and this may be affecting her eating, especially late at night.  Vitamin D deficiency Rebecca Dominguez has a diagnosis of Vitamin D deficiency. Her last level was at goal, but she is at risk of over-replacement. She is currently taking prescription Vit D and denies nausea, vomiting or muscle weakness. Rebecca Dominguez is status post GBP.  ASSESSMENT AND PLAN:  Anxiety  Vitamin D deficiency  Class 3 severe obesity with serious comorbidity and body mass index (BMI) of 50.0 to 59.9 in adult, unspecified obesity type [facility])  PLAN:  Anxiety We discussed with [name] stress reduction techniques and avoiding watching the news all day long. She will consider.  Vitamin D Deficiency [name] was informed that low Vitamin D levels contributes to fatigue and are associated with obesity, breast, and colon cancer. She will get labs checked at her PCP's  office at her upcoming physical and we will review results at follow-up with our clinic in 3-4 weeks.  I spent > than 50% of the 25 minute visit on counseling as documented in the note.  Obesity Rebecca Dominguez is currently in the action stage of change. As such, her goal is to maintain weight for now. She has agreed to follow the Category 3 plan. Rebecca Dominguez has been instructed to work up to a goal of 150 minutes of combined cardio and strengthening exercise per week for weight loss and overall health benefits. We discussed the following Behavioral Modification Strategies today: increasing lean protein intake and decreasing simple carbohydrates.  Rebecca Dominguez has agreed to follow-up with our clinic in 3-4 weeks. She was informed of the importance of frequent follow-up visits to maximize her success with intensive lifestyle modifications for her multiple health conditions.  ALLERGIES: No Known Allergies  MEDICATIONS: Current Outpatient Medications on File Prior to Visit  Medication Sig Dispense Refill  . aspirin-acetaminophen-caffeine (EXCEDRIN MIGRAINE) 250-250-65 MG tablet Take 1 tablet by mouth every 6 (six) hours as needed for headache.    . cholecalciferol (VITAMIN D) 1000 units tablet Take 1,000 Units by mouth daily.    . cyanocobalamin 500 MCG tablet Take 500 mcg by mouth daily.    . ferrous sulfate 325 (65 FE) MG tablet Take 325 mg by mouth daily with breakfast.    . Glucosamine-Chondroit-Vit C-Mn (GLUCOSAMINE CHONDR 1500 COMPLX) CAPS Take 1 capsule by mouth daily.    Marland Kitchen levonorgestrel (MIRENA) 20 MCG/24HR IUD 1 each by Intrauterine route once.    Marland Kitchen  Multiple Vitamins-Minerals (MULTIVITAMIN WOMEN) TABS Take 1 tablet by mouth daily.    . naproxen (NAPROSYN) 500 MG tablet Take 500 mg by mouth 2 (two) times daily with a meal.    . vitamin C (ASCORBIC ACID) 500 MG tablet Take 500 mg by mouth daily.     No current facility-administered medications on file prior to visit.     PAST MEDICAL  HISTORY: Past Medical History:  Diagnosis Date  . Anemia    low iron  . Back pain   . Foot pain    from previous fracture of toe 2010  . Gallbladder problem    removed  . Headache    occasional  . Hypertension    no longer on medications since weight loss  . Joint pain   . Obesity   . Plantar fasciitis   . Vitamin B 12 deficiency   . Vitamin D deficiency     PAST SURGICAL HISTORY: Past Surgical History:  Procedure Laterality Date  . CHOLECYSTECTOMY  2013  . COLONOSCOPY WITH PROPOFOL N/A 03/06/2016   Procedure: COLONOSCOPY WITH PROPOFOL;  Surgeon: Arta Silence, MD;  Location: [facility] ENDOSCOPY;  Service: Endoscopy;  Laterality: N/A;  . gastric bypass surgery  2001  . PILONIDAL CYST EXCISION    . WISDOM TOOTH EXTRACTION      SOCIAL HISTORY: Social History   Tobacco Use  . Smoking status: Never Smoker  . Smokeless tobacco: Never Used  Substance Use Topics  . Alcohol use: Yes    Comment: rare/special occasion  . Drug use: No    FAMILY HISTORY: Family History  Problem Relation Age of Onset  . Breast cancer Mother   . Obesity Mother   . Colon cancer Father   . Diabetes Father   . Obesity Father    ROS: Review of Systems  Gastrointestinal: Negative for nausea and vomiting.  Musculoskeletal:       Negative for muscle weakness.  Psychiatric/Behavioral: The patient is nervous/anxious.    PHYSICAL EXAM: Pt in no acute distress  RECENT LABS AND TESTS: BMET    Component Value Date/Time   NA 136 09/12/2018 0957   K 4.2 09/12/2018 0957   CL 100 09/12/2018 0957   CO2 21 09/12/2018 0957   GLUCOSE 79 09/12/2018 0957   BUN 16 09/12/2018 0957   CREATININE 0.71 09/12/2018 0957   CALCIUM 9.8 09/12/2018 0957   GFRNONAA 105 09/12/2018 0957   GFRAA 121 09/12/2018 0957   Lab Results  Component Value Date   HGBA1C 5.3 09/12/2018   Lab Results  Component Value Date   INSULIN 8.8 09/12/2018   CBC    Component Value Date/Time   WBC 6.8 09/12/2018 0957   RBC  4.71 09/12/2018 0957   HGB 12.6 09/12/2018 0957   HCT 38.5 09/12/2018 0957   MCV 82 09/12/2018 0957   MCH 26.8 09/12/2018 0957   MCHC 32.7 09/12/2018 0957   RDW 15.3 09/12/2018 0957   LYMPHSABS 1.7 09/12/2018 0957   EOSABS 0.2 09/12/2018 0957   BASOSABS 0.0 09/12/2018 0957   Iron/TIBC/Ferritin/ %Sat    Component Value Date/Time   IRON 47 09/12/2018 0957   TIBC 405 09/12/2018 0957   FERRITIN 17 09/12/2018 0957   IRONPCTSAT 12 (L) 09/12/2018 0957   Lipid Panel     Component Value Date/Time   CHOL 152 09/12/2018 0957   TRIG 48 09/12/2018 0957   HDL 63 09/12/2018 0957   LDLCALC 79 09/12/2018 0957   Hepatic Function Panel  Component Value Date/Time   PROT 6.9 09/12/2018 0957   ALBUMIN 4.4 09/12/2018 0957   AST 13 09/12/2018 0957   ALT 14 09/12/2018 0957   ALKPHOS 92 09/12/2018 0957   BILITOT 0.3 09/12/2018 0957      Component Value Date/Time   TSH 4.350 09/12/2018 0957   Results for Evonnie PatAYLOR, Nasrin D (MRN [id number]) as of 05/24/2019 12:02  Ref. Range 09/12/2018 09:57  Vitamin D, 25-Hydroxy Latest Ref Range: 30.0 - 100.0 ng/mL 67.3   I, Marianna Paymentenise Haag, am acting as Energy managertranscriptionist for Quillian Quincearen Markeia Harkless, MD I have reviewed the above documentation for accuracy and completeness, and I agree with the above. -Quillian Quincearen Klye Besecker, MD

## 2019-05-31 ENCOUNTER — Other Ambulatory Visit (HOSPITAL_COMMUNITY)
Admission: RE | Admit: 2019-05-31 | Discharge: 2019-05-31 | Disposition: A | Discharge status: Home / Self Care | Payer: BLUE CROSS/BLUE SHIELD | Source: Ambulatory Visit | Attending: Family Medicine | Admitting: Family Medicine

## 2019-05-31 ENCOUNTER — Other Ambulatory Visit: Payer: Self-pay | Admitting: Family Medicine

## 2019-05-31 DIAGNOSIS — Z124 Encounter for screening for malignant neoplasm of cervix: Secondary | ICD-10-CM | POA: Insufficient documentation

## 2019-06-01 LAB — CYTOLOGY - PAP
Comment: NEGATIVE
Diagnosis: NEGATIVE
High risk HPV: NEGATIVE

## 2019-06-20 ENCOUNTER — Encounter (INDEPENDENT_AMBULATORY_CARE_PROVIDER_SITE_OTHER): Payer: Self-pay | Admitting: Family Medicine

## 2019-06-21 ENCOUNTER — Telehealth (INDEPENDENT_AMBULATORY_CARE_PROVIDER_SITE_OTHER): Payer: BLUE CROSS/BLUE SHIELD | Admitting: Family Medicine

## 2019-06-22 ENCOUNTER — Ambulatory Visit (INDEPENDENT_AMBULATORY_CARE_PROVIDER_SITE_OTHER): Payer: BLUE CROSS/BLUE SHIELD | Admitting: Family Medicine

## 2019-06-22 ENCOUNTER — Other Ambulatory Visit: Payer: Self-pay

## 2019-06-22 ENCOUNTER — Encounter (INDEPENDENT_AMBULATORY_CARE_PROVIDER_SITE_OTHER): Payer: Self-pay | Admitting: Family Medicine

## 2019-06-22 VITALS — BP 123/79 | HR 84 | Temp 98.2°F | Ht 65.0 in | Wt 307.0 lb

## 2019-06-22 DIAGNOSIS — Z9884 Bariatric surgery status: Secondary | ICD-10-CM | POA: Diagnosis not present

## 2019-06-22 DIAGNOSIS — Z9189 Other specified personal risk factors, not elsewhere classified: Secondary | ICD-10-CM

## 2019-06-22 DIAGNOSIS — Z6841 Body Mass Index (BMI) 40.0 and over, adult: Secondary | ICD-10-CM

## 2019-06-22 DIAGNOSIS — E8881 Metabolic syndrome: Secondary | ICD-10-CM

## 2019-06-22 DIAGNOSIS — E559 Vitamin D deficiency, unspecified: Secondary | ICD-10-CM

## 2019-06-22 DIAGNOSIS — F419 Anxiety disorder, unspecified: Secondary | ICD-10-CM

## 2019-06-22 NOTE — Progress Notes (Signed)
Office: [phone]  /  Fax: [phone]   HPI:   Chief Complaint: OBESITY Rebecca Dominguez is here to discuss her progress with her obesity treatment plan. She is on the Category 3 plan and is following her eating plan approximately 80-85 % of the time. She states she is walking 1 mile 1 time per week. Rebecca Dominguez is doing well, and she likes the Category 3 options. She has a history of gastric bypass, and is sometimes limited to 4 ounces of meat.  Her weight is (!) 307 lb (139.3 kg) today and has had a weight loss of 23 pounds over a period of 8 to 9 months since her last visit. She has lost 31 lbs since starting treatment with us.  Vitamin D Deficiency Rebecca Dominguez has a diagnosis of vitamin D deficiency. She is currently taking OTC Vit D. Last level was above goal at last check with her primary care physician visit (91.5). She denies nausea, vomiting or muscle weakness.  Anxiety Rebecca Dominguez states her anxiety has improved slightly after the election, but still high.  History of Gastric Bypass Rebecca Dominguez has a history of gastric bypass in 2001.  Insulin Resistance Rebecca Dominguez has a diagnosis of insulin resistance based on her elevated fasting insulin level >5. Last A1c was 5.3 on last labs with her primary care physician. Although Elley's blood glucose readings are still under good control, insulin resistance puts her at greater risk of metabolic syndrome and diabetes. She is not taking metformin currently and continues to work on diet and exercise to decrease risk of diabetes.  At risk for diabetes Rebecca Dominguez is at higher than average risk for developing diabetes due to her obesity and insulin resistance. She currently denies polyuria or polydipsia.  ASSESSMENT AND PLAN:  Vitamin D deficiency  Anxiety  Insulin resistance  History of gastric bypass  At risk for diabetes mellitus  Class 3 severe obesity with serious comorbidity and body mass index (BMI) of 50.0 to 59.9 in adult,  unspecified obesity type (HCC)  PLAN:  Vitamin D Deficiency Rebecca Dominguez was informed that low vitamin D levels contributes to fatigue and are associated with obesity, breast, and colon cancer. Rebecca Dominguez agrees to decrease OTC Vit D to 2,000 IU daily (from 5,000). She will follow up for routine testing of vitamin D, at least 2-3 times per year. She was informed of the risk of over-replacement of vitamin D and agrees to not increase her dose unless she discusses this with us first. Rebecca Dominguez agrees to follow up with our clinic in 2 weeks.  Anxiety I have encouraged ongoing therapy, and we will continue to monitor.  History of Gastric Bypass Rebecca Dominguez was encouraged to take bariatric multivitamin. She is ok to stop regular multivitamins plus Vit B. Rebecca Dominguez agrees to follow up with our clinic in 2 weeks.  Insulin Resistance Rebecca Dominguez will continue to work on weight loss, exercise, and decreasing simple carbohydrates in her diet to help decrease the risk of diabetes. We dicussed metformin including benefits and risks. She was informed that eating too many simple carbohydrates or too many calories at one sitting increases the likelihood of GI side effects. Rebecca Dominguez agrees to follow up with us as directed to monitor her progress.  Diabetes risk counseling Rebecca Dominguez was given extended (~15 minutes) diabetes prevention counseling today. She is 43 y.o. female and has risk factors for diabetes including obesity and insulin resistance. We discussed intensive lifestyle modifications today with an emphasis on weight loss as well as increasing exercise and decreasing simple carbohydrates  in her diet.  Obesity [name] is currently in the action stage of change. As such, her goal is to continue with weight loss efforts She has agreed to follow the Category 3 plan [name] has been instructed to work up to a goal of 150 minutes of combined cardio and strengthening exercise per week or as tolerated for  weight loss and overall health benefits. We discussed the following Behavioral Modification Strategies today: increasing lean protein intake, decreasing simple carbohydrates, increasing vegetables, increase H20 intake, work on meal planning and easy cooking plans and holiday eating strategies    [name] has agreed to follow up with our clinic in 2 weeks. She was informed of the importance of frequent follow up visits to maximize her success with intensive lifestyle modifications for her multiple health conditions.  ALLERGIES: No Known Allergies  MEDICATIONS: Current Outpatient Medications on File Prior to Visit  Medication Sig Dispense Refill  . aspirin-acetaminophen-caffeine (EXCEDRIN MIGRAINE) 250-250-65 MG tablet Take 1 tablet by mouth every 6 (six) hours as needed for headache.    . cholecalciferol (VITAMIN D) 1000 units tablet Take 1,000 Units by mouth daily.    . cyanocobalamin 500 MCG tablet Take 500 mcg by mouth daily.    . ferrous sulfate 325 (65 FE) MG tablet Take 325 mg by mouth daily with breakfast.    . Glucosamine-Chondroit-Vit C-Mn (GLUCOSAMINE CHONDR 1500 COMPLX) CAPS Take 1 capsule by mouth daily.    Marland Kitchen levonorgestrel (MIRENA) 20 MCG/24HR IUD 1 each by Intrauterine route once.    . Multiple Vitamins-Minerals (MULTIVITAMIN WOMEN) TABS Take 1 tablet by mouth daily.    . naproxen (NAPROSYN) 500 MG tablet Take 500 mg by mouth 2 (two) times daily with a meal.    . vitamin C (ASCORBIC ACID) 500 MG tablet Take 500 mg by mouth daily.     No current facility-administered medications on file prior to visit.     PAST MEDICAL HISTORY: Past Medical History:  Diagnosis Date  . Anemia    low iron  . Back pain   . Foot pain    from previous fracture of toe 2010  . Gallbladder problem    removed  . Headache    occasional  . Hypertension    no longer on medications since weight loss  . Joint pain   . Obesity   . Plantar fasciitis   . Vitamin B 12 deficiency   . Vitamin D  deficiency     PAST SURGICAL HISTORY: Past Surgical History:  Procedure Laterality Date  . CHOLECYSTECTOMY  2013  . COLONOSCOPY WITH PROPOFOL N/A 03/06/2016   Procedure: COLONOSCOPY WITH PROPOFOL;  Surgeon: Arta Silence, MD;  Location: [facility] ENDOSCOPY;  Service: Endoscopy;  Laterality: N/A;  . gastric bypass surgery  2001  . PILONIDAL CYST EXCISION    . WISDOM TOOTH EXTRACTION      SOCIAL HISTORY: Social History   Tobacco Use  . Smoking status: Never Smoker  . Smokeless tobacco: Never Used  Substance Use Topics  . Alcohol use: Yes    Comment: rare/special occasion  . Drug use: No    FAMILY HISTORY: Family History  Problem Relation Age of Onset  . Breast cancer Mother   . Obesity Mother   . Colon cancer Father   . Diabetes Father   . Obesity Father     ROS: Review of Systems  Constitutional: Positive for weight loss.  Gastrointestinal: Negative for nausea and vomiting.  Genitourinary: Negative for frequency.  Musculoskeletal:  Negative muscle weakness  Endo/Heme/Allergies: Negative for polydipsia.  Psychiatric/Behavioral:       + Anxiety    PHYSICAL EXAM: Blood pressure 123/79, pulse 84, temperature 98.2 F (36.8 C), temperature source Oral, height  (1.651 m), weight (!) 307 lb (139.3 kg), SpO2 100 %. Body mass index is 51.09 kg/m. Physical Exam Vitals signs reviewed.  Constitutional:      Appearance: Normal appearance. She is obese.  Cardiovascular:     Rate and Rhythm: Normal rate.     Pulses: Normal pulses.  Pulmonary:     Effort: Pulmonary effort is normal.     Breath sounds: Normal breath sounds.  Musculoskeletal: Normal range of motion.  Skin:    General: Skin is warm and dry.  Neurological:     Mental Status: She is alert and oriented to person, place, and time.  Psychiatric:        Mood and Affect: Mood normal.        Behavior: Behavior normal.     RECENT LABS AND TESTS: BMET    Component Value Date/Time   NA 136 09/12/2018  0957   K 4.2 09/12/2018 0957   CL 100 09/12/2018 0957   CO2 21 09/12/2018 0957   GLUCOSE 79 09/12/2018 0957   BUN 16 09/12/2018 0957   CREATININE 0.71 09/12/2018 0957   CALCIUM 9.8 09/12/2018 0957   GFRNONAA 105 09/12/2018 0957   GFRAA 121 09/12/2018 0957   Lab Results  Component Value Date   HGBA1C 5.3 09/12/2018   Lab Results  Component Value Date   INSULIN 8.8 09/12/2018   CBC    Component Value Date/Time   WBC 6.8 09/12/2018 0957   RBC 4.71 09/12/2018 0957   HGB 12.6 09/12/2018 0957   HCT 38.5 09/12/2018 0957   MCV 82 09/12/2018 0957   MCH 26.8 09/12/2018 0957   MCHC 32.7 09/12/2018 0957   RDW 15.3 09/12/2018 0957   LYMPHSABS 1.7 09/12/2018 0957   EOSABS 0.2 09/12/2018 0957   BASOSABS 0.0 09/12/2018 0957   Iron/TIBC/Ferritin/ %Sat    Component Value Date/Time   IRON 47 09/12/2018 0957   TIBC 405 09/12/2018 0957   FERRITIN 17 09/12/2018 0957   IRONPCTSAT 12 (L) 09/12/2018 0957   Lipid Panel     Component Value Date/Time   CHOL 152 09/12/2018 0957   TRIG 48 09/12/2018 0957   HDL 63 09/12/2018 0957   LDLCALC 79 09/12/2018 0957   Hepatic Function Panel     Component Value Date/Time   PROT 6.9 09/12/2018 0957   ALBUMIN 4.4 09/12/2018 0957   AST 13 09/12/2018 0957   ALT 14 09/12/2018 0957   ALKPHOS 92 09/12/2018 0957   BILITOT 0.3 09/12/2018 0957      Component Value Date/Time   TSH 4.350 09/12/2018 0957      OBESITY BEHAVIORAL INTERVENTION VISIT  Today's visit was # 17   Starting weight: 338 lbs Starting date: 09/12/2018 Today's weight : 307 lbs Today's date: 06/22/2019 Total lbs lost to date: 15    ASK: We discussed the diagnosis of obesity with Evonnie Pat today and Rebecca Leatherwood agreed to give [place] permission to discuss obesity behavioral modification therapy today.  ASSESS: Anabella has the diagnosis of obesity and her BMI today is 51.09 Sanika is in the action stage of change   ADVISE: Ranyah was educated on the multiple  health risks of obesity as well as the benefit of weight loss to improve her health. She was advised of the  need for long term treatment and the importance of lifestyle modifications to improve her current health and to decrease her risk of future health problems.  AGREE: Multiple dietary modification options and treatment options were discussed and  Jonathon agreed to follow the recommendations documented in the above note.  ARRANGE: Mazel was educated on the importance of frequent visits to treat obesity as outlined per CMS and USPSTF guidelines and agreed to schedule her next follow up appointment today.  Trude Mcburney, am acting as transcriptionist for Helane Rima, DO  I have reviewed the above documentation for accuracy and completeness, and I agree with the above. Helane Rima, DO

## 2019-06-26 ENCOUNTER — Encounter (INDEPENDENT_AMBULATORY_CARE_PROVIDER_SITE_OTHER): Payer: Self-pay | Admitting: Family Medicine

## 2019-07-05 ENCOUNTER — Other Ambulatory Visit: Payer: Self-pay

## 2019-07-05 ENCOUNTER — Encounter (INDEPENDENT_AMBULATORY_CARE_PROVIDER_SITE_OTHER): Payer: Self-pay | Admitting: Family Medicine

## 2019-07-05 ENCOUNTER — Telehealth (INDEPENDENT_AMBULATORY_CARE_PROVIDER_SITE_OTHER): Payer: BLUE CROSS/BLUE SHIELD | Admitting: Family Medicine

## 2019-07-05 DIAGNOSIS — R5383 Other fatigue: Secondary | ICD-10-CM | POA: Diagnosis not present

## 2019-07-05 DIAGNOSIS — Z6841 Body Mass Index (BMI) 40.0 and over, adult: Secondary | ICD-10-CM | POA: Diagnosis not present

## 2019-07-10 NOTE — Progress Notes (Signed)
Office: [phone]  /  Fax: [phone] TeleHealth Visit:  [name] has verbally consented to this TeleHealth visit today. The patient is located at home, the provider is located at the [employer] and Wellness office. The participants in this visit include the listed provider and patient. The visit was conducted today via face time.  HPI:  Chief Complaint: OBESITY Rebecca Dominguez is here to discuss her progress with her obesity treatment plan. She is on the Category 3 plan and states she is following her eating plan approximately 80 % of the time. She states she is exercising 0 minutes 0 times per week.  [name] continues to do well with maintaining her weight loss. She notes her hunger is controlled. She feels Christmas this year will not be as challenging for over eating due to much smaller gathering (her household only).  Fatigue [name] notes increased stress and increased daytime fatigue. Her sleep is fractured and this is likely not helping. She had labs done at her primary care physician's office recently, but they are not in Epic.  ASSESSMENT AND PLAN:  Other fatigue  Class 3 severe obesity with serious comorbidity and body mass index (BMI) of 50.0 to 59.9 in adult, unspecified obesity type (HCC)  PLAN:  Fatigue [name] was informed fatigue may be related to obesity, depression or many other causes. Sleep strategies were discussed and we will get lab records from her primary care physician. [name] has agreed to work on diet, exercise and weight loss.  I spent > than 50% of the 14 minute visit on counseling as documented in the note.  Obesity [name] is currently in the action stage of change. As such, her goal is to continue with weight loss efforts She has agreed to follow the Category 3 plan [name] will work up to a goal of 150 minutes of combined cardio and strengthening exercise per week for weight loss and overall health benefits. We discussed  the following Behavioral Modification Strategies today: holiday eating strategies    [name] has agreed to follow up with our clinic in 4 weeks. She was informed of the importance of frequent follow up visits to maximize her success with intensive lifestyle modifications for her multiple health conditions.  ALLERGIES: No Known Allergies  MEDICATIONS: Current Outpatient Medications on File Prior to Visit  Medication Sig Dispense Refill  . aspirin-acetaminophen-caffeine (EXCEDRIN MIGRAINE) 250-250-65 MG tablet Take 1 tablet by mouth every 6 (six) hours as needed for headache.    . cholecalciferol (VITAMIN D) 1000 units tablet Take 1,000 Units by mouth daily.    . cyanocobalamin 500 MCG tablet Take 500 mcg by mouth daily.    . ferrous sulfate 325 (65 FE) MG tablet Take 325 mg by mouth daily with breakfast.    . Glucosamine-Chondroit-Vit C-Mn (GLUCOSAMINE CHONDR 1500 COMPLX) CAPS Take 1 capsule by mouth daily.    Marland Kitchen levonorgestrel (MIRENA) 20 MCG/24HR IUD 1 each by Intrauterine route once.    . Multiple Vitamins-Minerals (MULTIVITAMIN WOMEN) TABS Take 1 tablet by mouth daily.    . naproxen (NAPROSYN) 500 MG tablet Take 500 mg by mouth 2 (two) times daily with a meal.    . vitamin C (ASCORBIC ACID) 500 MG tablet Take 500 mg by mouth daily.     No current facility-administered medications on file prior to visit.    PAST MEDICAL HISTORY: Past Medical History:  Diagnosis Date  . Anemia    low iron  . Back pain   . Foot pain  from previous fracture of toe 2010  . Gallbladder problem    removed  . Headache    occasional  . Hypertension    no longer on medications since weight loss  . Joint pain   . Obesity   . Plantar fasciitis   . Vitamin B 12 deficiency   . Vitamin D deficiency     PAST SURGICAL HISTORY: Past Surgical History:  Procedure Laterality Date  . CHOLECYSTECTOMY  2013  . COLONOSCOPY WITH PROPOFOL N/A 03/06/2016   Procedure: COLONOSCOPY WITH PROPOFOL;  Surgeon:  Arta Silence, MD;  Location: [facility] ENDOSCOPY;  Service: Endoscopy;  Laterality: N/A;  . gastric bypass surgery  2001  . PILONIDAL CYST EXCISION    . WISDOM TOOTH EXTRACTION      SOCIAL HISTORY: Social History   Tobacco Use  . Smoking status: Never Smoker  . Smokeless tobacco: Never Used  Substance Use Topics  . Alcohol use: Yes    Comment: rare/special occasion  . Drug use: No    FAMILY HISTORY: Family History  Problem Relation Age of Onset  . Breast cancer Mother   . Obesity Mother   . Colon cancer Father   . Diabetes Father   . Obesity Father     ROS: Review of Systems  Constitutional: Positive for malaise/fatigue. Negative for weight loss.    PHYSICAL EXAM: There were no vitals taken for this visit. There is no height or weight on file to calculate BMI. Physical Exam Vitals reviewed.  Constitutional:      Appearance: She is normal weight.  Cardiovascular:     Rate and Rhythm: Normal rate.     Pulses: Normal pulses.  Pulmonary:     Effort: Pulmonary effort is normal.     Breath sounds: Normal breath sounds.  Skin:    General: Skin is warm and dry.  Neurological:     Mental Status: She is alert and oriented to person, place, and time.  Psychiatric:        Mood and Affect: Mood normal.        Behavior: Behavior normal.     RECENT LABS AND TESTS: BMET    Component Value Date/Time   NA 136 09/12/2018 0957   K 4.2 09/12/2018 0957   CL 100 09/12/2018 0957   CO2 21 09/12/2018 0957   GLUCOSE 79 09/12/2018 0957   BUN 16 09/12/2018 0957   CREATININE 0.71 09/12/2018 0957   CALCIUM 9.8 09/12/2018 0957   GFRNONAA 105 09/12/2018 0957   GFRAA 121 09/12/2018 0957   Lab Results  Component Value Date   HGBA1C 5.3 09/12/2018   Lab Results  Component Value Date   INSULIN 8.8 09/12/2018   CBC    Component Value Date/Time   WBC 6.8 09/12/2018 0957   RBC 4.71 09/12/2018 0957   HGB 12.6 09/12/2018 0957   HCT 38.5 09/12/2018 0957   MCV 82 09/12/2018 0957     MCH 26.8 09/12/2018 0957   MCHC 32.7 09/12/2018 0957   RDW 15.3 09/12/2018 0957   LYMPHSABS 1.7 09/12/2018 0957   EOSABS 0.2 09/12/2018 0957   BASOSABS 0.0 09/12/2018 0957   Iron/TIBC/Ferritin/ %Sat    Component Value Date/Time   IRON 47 09/12/2018 0957   TIBC 405 09/12/2018 0957   FERRITIN 17 09/12/2018 0957   IRONPCTSAT 12 (L) 09/12/2018 0957   Lipid Panel     Component Value Date/Time   CHOL 152 09/12/2018 0957   TRIG 48 09/12/2018 0957   HDL 63 09/12/2018  0957   LDLCALC 79 09/12/2018 0957   Hepatic Function Panel     Component Value Date/Time   PROT 6.9 09/12/2018 0957   ALBUMIN 4.4 09/12/2018 0957   AST 13 09/12/2018 0957   ALT 14 09/12/2018 0957   ALKPHOS 92 09/12/2018 0957   BILITOT 0.3 09/12/2018 0957      Component Value Date/Time   TSH 4.350 09/12/2018 0957        I, Burt KnackSharon Martin, am acting as transcriptionist for Quillian Quincearen Shadee Rathod, MD I have reviewed the above documentation for accuracy and completeness, and I agree with the above. -Quillian Quincearen Omarri Eich, MD

## 2019-07-12 ENCOUNTER — Ambulatory Visit
Admission: RE | Admit: 2019-07-12 | Discharge: 2019-07-12 | Disposition: A | Discharge status: Home / Self Care | Payer: BLUE CROSS/BLUE SHIELD | Source: Ambulatory Visit | Attending: Family Medicine | Admitting: Family Medicine

## 2019-07-12 ENCOUNTER — Other Ambulatory Visit: Payer: Self-pay

## 2019-07-12 DIAGNOSIS — Z1231 Encounter for screening mammogram for malignant neoplasm of breast: Secondary | ICD-10-CM

## 2019-08-02 ENCOUNTER — Encounter (INDEPENDENT_AMBULATORY_CARE_PROVIDER_SITE_OTHER): Payer: Self-pay | Admitting: Family Medicine

## 2019-08-02 ENCOUNTER — Other Ambulatory Visit: Payer: Self-pay

## 2019-08-02 ENCOUNTER — Telehealth (INDEPENDENT_AMBULATORY_CARE_PROVIDER_SITE_OTHER): Payer: BC Managed Care – PPO | Admitting: Family Medicine

## 2019-08-02 DIAGNOSIS — Z6841 Body Mass Index (BMI) 40.0 and over, adult: Secondary | ICD-10-CM | POA: Diagnosis not present

## 2019-08-02 DIAGNOSIS — G4489 Other headache syndrome: Secondary | ICD-10-CM | POA: Diagnosis not present

## 2019-08-03 NOTE — Progress Notes (Signed)
TeleHealth Visit:  Due to the COVID-19 pandemic, this visit was completed with telemedicine (audio/video) technology to reduce patient and provider exposure as well as to preserve personal protective equipment.   Rebecca Dominguez has verbally consented to this TeleHealth visit. The patient is located at home, the provider is located at the [employer] and Wellness office. The participants in this visit include the listed provider and patient. The visit was conducted today via face time.   Chief Complaint: OBESITY Rebecca Dominguez is here to discuss her progress with her obesity treatment plan along with follow-up of her obesity related diagnoses. Gregory is on the Category 3 Plan and states she is following her eating plan approximately 70% of the time. Gavyn states she is walking the dogs for 30-40 minutes 1-2 times per week.  Today's visit was #: 19 Starting weight: 338 lbs Starting date: 09/12/2018  Interim History: Rebecca Dominguez has done well avoiding holiday weight gain and may have lost approximately 2-3 lbs in the last month. She states her weight this morning was 305 lbs. Her hunger is mostly controlled, but she tends to deviate more from her plan in the evening.  Subjective:   1. Other headache syndrome Rebecca Dominguez notes having some morning headaches, especially if she doesn't sleep well the night before. She notes this isn't very common, but she has had increased stress which may affect her sleep.  Assessment/Plan:   1. Other headache syndrome Rebecca Dominguez is to continue to monitor to see if this is more frequent. She may need a referral to Neurology or sleep doctor if no improvement. We will continue to monitor.  2. Class 3 severe obesity with serious comorbidity and body mass index (BMI) of 50.0 to 59.9 in adult, unspecified obesity type [facility]) Rebecca Dominguez is currently in the action stage of change. As such, her goal is to continue with weight loss efforts. She has agreed  to on the Category 3 Plan.   We discussed the following exercise goals today:continue as is. We discussed the following behavioral modification strategies today: meal planning and cooking strategies.  Rebecca Dominguez has agreed to follow-up with our clinic in 4 weeks. She was informed of the importance of frequent follow-up visits to maximize her success with intensive lifestyle modifications for her multiple health conditions.  Objective:   VITALS: Per patient if applicable, see vitals. GENERAL: Alert and in no acute distress. CARDIOPULMONARY: No increased WOB. Speaking in clear sentences.  PSYCH: Pleasant and cooperative. Speech normal rate and rhythm. Affect is appropriate. Insight and judgement are appropriate. Attention is focused, linear, and appropriate.  NEURO: Oriented as arrived to appointment on time with no prompting.   Lab Results  Component Value Date   CREATININE 0.71 09/12/2018   BUN 16 09/12/2018   NA 136 09/12/2018   K 4.2 09/12/2018   CL 100 09/12/2018   CO2 21 09/12/2018   Lab Results  Component Value Date   ALT 14 09/12/2018   AST 13 09/12/2018   ALKPHOS 92 09/12/2018   BILITOT 0.3 09/12/2018   Lab Results  Component Value Date   HGBA1C 5.3 09/12/2018   Lab Results  Component Value Date   INSULIN 8.8 09/12/2018   Lab Results  Component Value Date   TSH 4.350 09/12/2018   Lab Results  Component Value Date   CHOL 152 09/12/2018   HDL 63 09/12/2018   LDLCALC 79 09/12/2018   TRIG 48 09/12/2018   Lab Results  Component Value Date  WBC 6.8 09/12/2018   HGB 12.6 09/12/2018   HCT 38.5 09/12/2018   MCV 82 09/12/2018   Lab Results  Component Value Date   IRON 47 09/12/2018   TIBC 405 09/12/2018   FERRITIN 17 09/12/2018    Attestation Statements:   Reviewed by clinician on day of visit: allergies, medications, problem list, medical history, surgical history, family history, social history, and previous encounter notes.  Time spent on  visit including pre-visit chart review and post-visit care was 24 minutes.   I, Burt Knack, am acting as transcriptionist for Quillian Quince, MD.  I have reviewed the above documentation for accuracy and completeness, and I agree with the above. - Quillian Quince, MD

## 2019-08-30 ENCOUNTER — Other Ambulatory Visit: Payer: Self-pay

## 2019-08-30 ENCOUNTER — Encounter (INDEPENDENT_AMBULATORY_CARE_PROVIDER_SITE_OTHER): Payer: Self-pay | Admitting: Family Medicine

## 2019-08-30 ENCOUNTER — Telehealth (INDEPENDENT_AMBULATORY_CARE_PROVIDER_SITE_OTHER): Payer: BC Managed Care – PPO | Admitting: Family Medicine

## 2019-08-30 DIAGNOSIS — Z6841 Body Mass Index (BMI) 40.0 and over, adult: Secondary | ICD-10-CM | POA: Diagnosis not present

## 2019-08-30 DIAGNOSIS — F419 Anxiety disorder, unspecified: Secondary | ICD-10-CM

## 2019-08-30 MED ORDER — ESCITALOPRAM OXALATE 10 MG PO TABS: 10.0000 mg | ORAL_TABLET | ORAL | 0 refills | Status: DC

## 2019-08-30 NOTE — Progress Notes (Signed)
TeleHealth Visit:  Due to the COVID-19 pandemic, this visit was completed with telemedicine (audio/video) technology to reduce patient and provider exposure as well as to preserve personal protective equipment.   Dyna has verbally consented to this TeleHealth visit. The patient is located at home, the provider is located at the [facility] office. The participants in this visit include the listed provider and patient. The visit was conducted today via face time.   Chief Complaint: OBESITY Rebecca Dominguez is here to discuss her progress with her obesity treatment plan along with follow-up of her obesity related diagnoses. Rebecca Dominguez is on the Category 3 Plan and states she is following her eating plan approximately 75% of the time. Rebecca Dominguez states she is doing 0 minutes 0 times per week.  Today's visit was #: 20 Starting weight: 338 lbs Starting date: 09/12/2018  Interim History: Rebecca Dominguez feels she is doing well with weight loss on her Category 3 plan. Her weight was 303 lbs this morning at home. She has not exercised as much due to increased back and knee pain, but she is looking forward to doing some walking again soon.  Subjective:   1. Anxiety Rebecca Dominguez has a new diagnosis of anxiety. She notes increased irritability, and she feels very stressed about the pandemic. She is frustrated that she is trying so hard to be safe but other people are not.  Assessment/Plan:   1. Anxiety Behavior modification techniques were discussed today to help Rebecca Dominguez deal with her anxiety. Rebecca Dominguez agreed to start Lexapro 10 mg daily with no refill, and we will follow closely. Orders and follow up as documented in patient record.   - escitalopram (LEXAPRO) 10 MG tablet; Take 1 tablet (10 mg total) by mouth every morning.  Dispense: 30 tablet; Refill: 0  2. Class 3 severe obesity with serious comorbidity and body mass index (BMI) of 50.0 to 59.9 in adult, unspecified obesity type [facility])  Rebecca Dominguez is currently in the action stage of change. As such, her goal is to continue with weight loss efforts. She has agreed to the Category 3 Plan.    Behavioral modification strategies: emotional eating strategies.  Rebecca Dominguez has agreed to follow-up with our clinic in 3 weeks with myself. She was informed of the importance of frequent follow-up visits to maximize her success with intensive lifestyle modifications for her multiple health conditions.  Objective:   VITALS: Per patient if applicable, see vitals. GENERAL: Alert and in no acute distress. CARDIOPULMONARY: No increased WOB. Speaking in clear sentences.  PSYCH: Pleasant and cooperative. Speech normal rate and rhythm. Affect is appropriate. Insight and judgement are appropriate. Attention is focused, linear, and appropriate.  NEURO: Oriented as arrived to appointment on time with no prompting.   Lab Results  Component Value Date   CREATININE 0.71 09/12/2018   BUN 16 09/12/2018   NA 136 09/12/2018   K 4.2 09/12/2018   CL 100 09/12/2018   CO2 21 09/12/2018   Lab Results  Component Value Date   ALT 14 09/12/2018   AST 13 09/12/2018   ALKPHOS 92 09/12/2018   BILITOT 0.3 09/12/2018   Lab Results  Component Value Date   HGBA1C 5.3 09/12/2018   Lab Results  Component Value Date   INSULIN 8.8 09/12/2018   Lab Results  Component Value Date   TSH 4.350 09/12/2018   Lab Results  Component Value Date   CHOL 152 09/12/2018   HDL 63 09/12/2018   LDLCALC 79 09/12/2018   TRIG 48  09/12/2018   Lab Results  Component Value Date   WBC 6.8 09/12/2018   HGB 12.6 09/12/2018   HCT 38.5 09/12/2018   MCV 82 09/12/2018   Lab Results  Component Value Date   IRON 47 09/12/2018   TIBC 405 09/12/2018   FERRITIN 17 09/12/2018    Attestation Statements:   Reviewed by clinician on day of visit: allergies, medications, problem list, medical history, surgical history, family history, social history, and previous encounter  notes.  Time spent on visit including pre-visit chart review and post-visit care was 32 minutes.    I, Burt Knack, am acting as transcriptionist for Quillian Quince, MD.  I have reviewed the above documentation for accuracy and completeness, and I agree with the above. - Quillian Quince, MD

## 2019-09-20 ENCOUNTER — Encounter (INDEPENDENT_AMBULATORY_CARE_PROVIDER_SITE_OTHER): Payer: Self-pay | Admitting: Family Medicine

## 2019-09-20 ENCOUNTER — Other Ambulatory Visit: Payer: Self-pay

## 2019-09-20 ENCOUNTER — Telehealth (INDEPENDENT_AMBULATORY_CARE_PROVIDER_SITE_OTHER): Payer: BC Managed Care – PPO | Admitting: Family Medicine

## 2019-09-20 DIAGNOSIS — F419 Anxiety disorder, unspecified: Secondary | ICD-10-CM | POA: Diagnosis not present

## 2019-09-20 DIAGNOSIS — Z6841 Body Mass Index (BMI) 40.0 and over, adult: Secondary | ICD-10-CM

## 2019-09-20 MED ORDER — ESCITALOPRAM OXALATE 10 MG PO TABS: 10.0000 mg | ORAL_TABLET | ORAL | 0 refills | Status: DC

## 2019-09-20 NOTE — Progress Notes (Signed)
TeleHealth Visit:  Due to the COVID-19 pandemic, this visit was completed with telemedicine (audio/video) technology to reduce patient and provider exposure as well as to preserve personal protective equipment.   Rebecca Dominguez has verbally consented to this TeleHealth visit. The patient is located at home, the provider is located at the [facility] [facility] office. The participants in this visit include the listed provider and patient. The visit was conducted today via face time.   Chief Complaint: OBESITY Rebecca Dominguez is here to discuss her progress with her obesity treatment plan along with follow-up of her obesity related diagnoses. Rebecca Dominguez is on the Category 3 Plan and states she is following her eating plan approximately 80% of the time. Rebecca Dominguez states she is doing 0 minutes 0 times per week.  Today's visit was #: 21 Starting weight: 338 lbs Starting date: 09/12/2018  Interim History: Rebecca Dominguez has been doing well with her weight loss. And she is now below 300 lbs. She feels she has lost approximately 4 lbs since her last visit, and she states it has been many years since she has been in the 200's. She is feeling well and her hunger is mostly controlled.  Subjective:   1. Anxiety Rebecca Dominguez started Lexapro at her last visit, and she feels she is doing better with her mood and she notes decreased irritability and seems more "even keel". She seems to be doing better with decreasing emotional eating as well.  Assessment/Plan:   1. Anxiety Behavior modification techniques were discussed today to help Rebecca Dominguez deal with her anxiety. We will refill Lexapro for 1 month, and will follow up. Orders and follow up as documented in patient record.   - escitalopram (LEXAPRO) 10 MG tablet; Take 1 tablet (10 mg total) by mouth every morning.  Dispense: 30 tablet; Refill: 0  2. Class 3 severe obesity with serious comorbidity and body mass index (BMI) of 50.0 to 59.9 in adult, unspecified  obesity type [facility]) Rebecca Dominguez is currently in the action stage of change. As such, her goal is to continue with weight loss efforts. She has agreed to the Category 3 Plan.   Behavioral modification strategies: meal planning and cooking strategies.  Rebecca Dominguez has agreed to follow-up with our clinic in 3 weeks. She was informed of the importance of frequent follow-up visits to maximize her success with intensive lifestyle modifications for her multiple health conditions.  Objective:   VITALS: Per patient if applicable, see vitals. GENERAL: Alert and in no acute distress. CARDIOPULMONARY: No increased WOB. Speaking in clear sentences.  PSYCH: Pleasant and cooperative. Speech normal rate and rhythm. Affect is appropriate. Insight and judgement are appropriate. Attention is focused, linear, and appropriate.  NEURO: Oriented as arrived to appointment on time with no prompting.   Lab Results  Component Value Date   CREATININE 0.71 09/12/2018   BUN 16 09/12/2018   NA 136 09/12/2018   K 4.2 09/12/2018   CL 100 09/12/2018   CO2 21 09/12/2018   Lab Results  Component Value Date   ALT 14 09/12/2018   AST 13 09/12/2018   ALKPHOS 92 09/12/2018   BILITOT 0.3 09/12/2018   Lab Results  Component Value Date   HGBA1C 5.3 09/12/2018   Lab Results  Component Value Date   INSULIN 8.8 09/12/2018   Lab Results  Component Value Date   TSH 4.350 09/12/2018   Lab Results  Component Value Date   CHOL 152 09/12/2018   HDL 63 09/12/2018   LDLCALC 79 09/12/2018  TRIG 48 09/12/2018   Lab Results  Component Value Date   WBC 6.8 09/12/2018   HGB 12.6 09/12/2018   HCT 38.5 09/12/2018   MCV 82 09/12/2018   Lab Results  Component Value Date   IRON 47 09/12/2018   TIBC 405 09/12/2018   FERRITIN 17 09/12/2018    Attestation Statements:   Reviewed by clinician on day of visit: allergies, medications, problem list, medical history, surgical history, family history, social history, and  previous encounter notes.  Time spent on visit including pre-visit chart review and post-visit charting and care was 30 minutes.    I, Trixie Dredge, am acting as transcriptionist for Dennard Nip, MD.  I have reviewed the above documentation for accuracy and completeness, and I agree with the above. - Dennard Nip, MD

## 2019-10-06 ENCOUNTER — Ambulatory Visit: Payer: Self-pay | Attending: Internal Medicine

## 2019-10-06 DIAGNOSIS — Z23 Encounter for immunization: Secondary | ICD-10-CM

## 2019-10-06 NOTE — Progress Notes (Signed)
   Covid-19 Vaccination Clinic  Name:  Rebecca Dominguez    MRN: [id number] DOB: 21-Apr-1976  10/06/2019  Rebecca Dominguez was observed post Covid-19 immunization for 15 minutes without incident. She was provided with Vaccine Information Sheet and instruction to access the V-Safe system.   Rebecca Dominguez was instructed to call 911 with any severe reactions post vaccine: Marland Kitchen Difficulty breathing  . Swelling of face and throat  . A fast heartbeat  . A bad rash all over body  . Dizziness and weakness   Immunizations Administered    Name Date Dose VIS Date Route   Pfizer COVID-19 Vaccine 10/06/2019 10:29 AM 0.3 mL 06/30/2019 Intramuscular   Manufacturer: [employer], [employer]   Lot: [id number]   NDC: [address]-3

## 2019-10-11 ENCOUNTER — Encounter (INDEPENDENT_AMBULATORY_CARE_PROVIDER_SITE_OTHER): Payer: Self-pay | Admitting: Family Medicine

## 2019-10-11 ENCOUNTER — Telehealth (INDEPENDENT_AMBULATORY_CARE_PROVIDER_SITE_OTHER): Payer: BC Managed Care – PPO | Admitting: Family Medicine

## 2019-10-11 ENCOUNTER — Other Ambulatory Visit: Payer: Self-pay

## 2019-10-11 DIAGNOSIS — Z6841 Body Mass Index (BMI) 40.0 and over, adult: Secondary | ICD-10-CM | POA: Diagnosis not present

## 2019-10-11 DIAGNOSIS — F3289 Other specified depressive episodes: Secondary | ICD-10-CM

## 2019-10-11 MED ORDER — ESCITALOPRAM OXALATE 10 MG PO TABS: 10.0000 mg | ORAL_TABLET | ORAL | 0 refills | Status: DC

## 2019-10-11 NOTE — Progress Notes (Signed)
TeleHealth Visit:  Due to the COVID-19 pandemic, this visit was completed with telemedicine (audio/video) technology to reduce patient and provider exposure as well as to preserve personal protective equipment.   Rebecca Dominguez has verbally consented to this TeleHealth visit. The patient is located at home, the provider is located at the [employer] [facility] office. The participants in this visit include the listed provider and patient. The visit was conducted today via face time.   Chief Complaint: OBESITY Rebecca Dominguez is here to discuss her progress with her obesity treatment plan along with follow-up of her obesity related diagnoses. Rebecca Dominguez is on the Category 3 Plan and states she is following her eating plan approximately 75% of the time. Rebecca Dominguez states she is doing 0 minutes 0 times per week.  Today's visit was #: 22 Starting weight: 338 lbs Starting date: 09/12/2018  Interim History: Rebecca Dominguez has done well with maintaining her weight. She had her first COVID vaccine, and she is hopeful she will bea able to restart exercise in the next month. She states her weight today was 299 lbs.  Subjective:   1. Other depression, with emotional eating Rebecca Dominguez's mood has continued to improve on Wellbutrin, and she is happier overall and looking forward to the future. She requests a 90 day script per her insurance requirements.  Assessment/Plan:   1. Other depression, with emotional eating Behavior modification techniques were discussed today to help Rebecca Dominguez deal with her emotional/non-hunger eating behaviors. We will refill Lexapro for 90 days with no refills. Orders and follow up as documented in patient record.   - escitalopram (LEXAPRO) 10 MG tablet; Take 1 tablet (10 mg total) by mouth every morning.  Dispense: 90 tablet; Refill: 0  2. Class 3 severe obesity with serious comorbidity and body mass index (BMI) of 50.0 to 59.9 in adult, unspecified obesity type [facility]) Rebecca Dominguez is  currently in the action stage of change. As such, her goal is to continue with weight loss efforts. She has agreed to the Category 3 Plan.   Behavioral modification strategies: emotional eating strategies.  Rebecca Dominguez has agreed to follow-up with our clinic in 3 weeks with Dr. Earlene Plater. She was informed of the importance of frequent follow-up visits to maximize her success with intensive lifestyle modifications for her multiple health conditions.  Rebecca Dominguez was informed we would discuss her lab results at her next visit unless there is a critical issue that needs to be addressed sooner. Rebecca Dominguez agreed to keep her next visit at the agreed upon time to discuss these results.  Objective:   VITALS: Per patient if applicable, see vitals. GENERAL: Alert and in no acute distress. CARDIOPULMONARY: No increased WOB. Speaking in clear sentences.  PSYCH: Pleasant and cooperative. Speech normal rate and rhythm. Affect is appropriate. Insight and judgement are appropriate. Attention is focused, linear, and appropriate.  NEURO: Oriented as arrived to appointment on time with no prompting.   Lab Results  Component Value Date   CREATININE 0.71 09/12/2018   BUN 16 09/12/2018   NA 136 09/12/2018   K 4.2 09/12/2018   CL 100 09/12/2018   CO2 21 09/12/2018   Lab Results  Component Value Date   ALT 14 09/12/2018   AST 13 09/12/2018   ALKPHOS 92 09/12/2018   BILITOT 0.3 09/12/2018   Lab Results  Component Value Date   HGBA1C 5.3 09/12/2018   Lab Results  Component Value Date   INSULIN 8.8 09/12/2018   Lab Results  Component Value Date   TSH  4.350 09/12/2018   Lab Results  Component Value Date   CHOL 152 09/12/2018   HDL 63 09/12/2018   LDLCALC 79 09/12/2018   TRIG 48 09/12/2018   Lab Results  Component Value Date   WBC 6.8 09/12/2018   HGB 12.6 09/12/2018   HCT 38.5 09/12/2018   MCV 82 09/12/2018   Lab Results  Component Value Date   IRON 47 09/12/2018   TIBC 405 09/12/2018    FERRITIN 17 09/12/2018    Attestation Statements:   Reviewed by clinician on day of visit: allergies, medications, problem list, medical history, surgical history, family history, social history, and previous encounter notes.  Time spent on visit including pre-visit chart review and post-visit charting and care was 31 minutes.    I, Trixie Dredge, am acting as transcriptionist for Dennard Nip, MD.  I have reviewed the above documentation for accuracy and completeness, and I agree with the above. - Dennard Nip, MD

## 2019-10-31 ENCOUNTER — Ambulatory Visit: Payer: Self-pay | Attending: Internal Medicine

## 2019-10-31 DIAGNOSIS — Z23 Encounter for immunization: Secondary | ICD-10-CM

## 2019-10-31 NOTE — Progress Notes (Signed)
   Covid-19 Vaccination Clinic  Name:  MARITHZA MALACHI    MRN: [id number] DOB: Jun 16, 1976  10/31/2019  Ms. Kreisler was observed post Covid-19 immunization for 15 minutes without incident. She was provided with Vaccine Information Sheet and instruction to access the V-Safe system.   Ms. Shoemaker was instructed to call 911 with any severe reactions post vaccine: Marland Kitchen Difficulty breathing  . Swelling of face and throat  . A fast heartbeat  . A bad rash all over body  . Dizziness and weakness   Immunizations Administered    Name Date Dose VIS Date Route   Pfizer COVID-19 Vaccine 10/31/2019 12:00 PM 0.3 mL 06/30/2019 Intramuscular   Manufacturer: [employer], [employer]   Lot: [id number]   NDC: [address]-2

## 2019-11-01 ENCOUNTER — Ambulatory Visit (INDEPENDENT_AMBULATORY_CARE_PROVIDER_SITE_OTHER): Payer: BC Managed Care – PPO | Admitting: Family Medicine

## 2019-11-06 ENCOUNTER — Other Ambulatory Visit: Payer: Self-pay

## 2019-11-06 ENCOUNTER — Encounter (INDEPENDENT_AMBULATORY_CARE_PROVIDER_SITE_OTHER): Payer: Self-pay | Admitting: Family Medicine

## 2019-11-06 ENCOUNTER — Ambulatory Visit (INDEPENDENT_AMBULATORY_CARE_PROVIDER_SITE_OTHER): Payer: BC Managed Care – PPO | Admitting: Family Medicine

## 2019-11-06 VITALS — BP 118/78 | HR 80 | Temp 98.4°F | Ht 65.0 in | Wt 292.0 lb

## 2019-11-06 DIAGNOSIS — Z9884 Bariatric surgery status: Secondary | ICD-10-CM

## 2019-11-06 DIAGNOSIS — Z9189 Other specified personal risk factors, not elsewhere classified: Secondary | ICD-10-CM | POA: Diagnosis not present

## 2019-11-06 DIAGNOSIS — Z6841 Body Mass Index (BMI) 40.0 and over, adult: Secondary | ICD-10-CM

## 2019-11-06 DIAGNOSIS — Z8639 Personal history of other endocrine, nutritional and metabolic disease: Secondary | ICD-10-CM

## 2019-11-06 DIAGNOSIS — F3289 Other specified depressive episodes: Secondary | ICD-10-CM | POA: Diagnosis not present

## 2019-11-09 NOTE — Progress Notes (Signed)
Chief Complaint:   OBESITY Rebecca Dominguez is here to discuss her progress with her obesity treatment plan along with follow-up of her obesity related diagnoses. Rebecca Dominguez is on the Category 3 Plan and states she is following her eating plan approximately 75% of the time. Rebecca Dominguez states she is exercising for 0 minutes 0 times per week.  Today's visit was #: 23 Starting weight: 338 lbs Starting date: 09/12/2018 Today's weight: 292 lbs Today's date: 11/06/2019 Total lbs lost to date: 46 lbs Total lbs lost since last in-office visit: 15 lbs  Interim History: Rebecca Dominguez reports that her anxiety and emotional eating have improved with SSRI.  Rebecca Dominguez provided the following food recall today:  Breakfast:  8 ounces Fairlife milk, Premier protein shake, lowfat string cheese, Dannon Greek yogurt. Snack:  Water, Coffee (with Splenda and creamer). Lunch:  Microwave dinner, cottage cheese/peaches. Dinner:  (Most variation) Meat and vegetables.  Subjective:   1. History of iron deficiency This is now controlled.  She takes an iron supplement daily.  CBC Latest Ref Rng & Units 09/12/2018  WBC 3.4 - 10.8 x10E3/uL 6.8  Hemoglobin 11.1 - 15.9 g/dL 12.6  Hematocrit 34.0 - 46.6 % 38.5   Lab Results  Component Value Date   IRON 47 09/12/2018   TIBC 405 09/12/2018   FERRITIN 17 09/12/2018   Lab Results  Component Value Date   VITAMINB12 1,591 (H) 09/12/2018   2. History of gastric bypass Rebecca Dominguez has no issues with restriction usually.  3. Other depression, with emotional eating Rebecca Dominguez is struggling with emotional eating and using food for comfort to the extent that it is negatively impacting her health. She has been working on behavior modification techniques to help reduce her emotional eating and has been successful. She shows no sign of suicidal or homicidal ideations.  She is taking Lexapro and reports that her anger, irritability, and anxiety are improving.  Assessment/Plan:    1. History of iron deficiency Orders and follow up as documented in patient record.  Counseling . Iron is essential for our bodies to make red blood cells.  Reasons that someone may be deficient include: an iron-deficient diet (more likely in those following vegan or vegetarian diets), women with heavy menses, patients with GI disorders or poor absorption, patients that have had bariatric surgery, frequent blood donors, patients with cancer, and patients with heart disease.   Rebecca Dominguez foods include dark leafy greens, red and white meats, eggs, seafood, and beans.   . Certain foods and drinks prevent your body from absorbing iron properly. Avoid eating these foods in the same meal as iron-rich foods or with iron supplements. These foods include: coffee, black tea, and red wine; milk, dairy products, and foods that are high in calcium; beans and soybeans; whole grains.  . Constipation can be a side effect of iron supplementation. Increased water and fiber intake are helpful. Water goal: > 2 liters/day. Fiber goal: > 25 grams/day.  2. History of gastric bypass Rebecca Dominguez is at risk for malnutrition due to her previous bariatric surgery.   Counseling  You may need to eat 3 meals and 2 snacks, or 5 small meals each day in order to reach your protein and calorie goals.   Allow at least 15 minutes for each meal so that you can eat mindfully. Listen to your body so that you do not overeat. For most people, your sleeve or pouch will comfortably hold 4-6 ounces.  Eat foods from all food groups. This includes fruits  and vegetables, grains, dairy, and meat and other proteins.  Include a protein-rich food at every meal and snack, and eat the protein food first.   You should be taking a Bariatric Multivitamin as well as calcium.   3. Other depression, with emotional eating Behavior modification techniques were discussed today to help Rebecca Dominguez deal with her emotional/non-hunger eating behaviors.   Orders and follow up as documented in patient record.   4. At risk for activity intolerance Rebecca Dominguez was given approximately 15 minutes of exercise intolerance counseling today. She is 44 y.o. female and has risk factors exercise intolerance including obesity. We discussed intensive lifestyle modifications today with an emphasis on specific weight loss instructions and strategies. Rebecca Dominguez will slowly increase activity as tolerated.  Repetitive spaced learning was employed today to elicit superior memory formation and behavioral change.  5. Class 3 severe obesity with serious comorbidity and body mass index (BMI) of 45.0 to 49.9 in adult, unspecified obesity type Rebecca Dominguez) Rebecca Dominguez is currently in the action stage of change. As such, her goal is to continue with weight loss efforts. She has agreed to the Category 3 Plan.   Exercise goals: For substantial health benefits, adults should do at least 150 minutes (2 hours and 30 minutes) a week of moderate-intensity, or 75 minutes (1 hour and 15 minutes) a week of vigorous-intensity aerobic physical activity, or an equivalent combination of moderate- and vigorous-intensity aerobic activity. Aerobic activity should be performed in episodes of at least 10 minutes, and preferably, it should be spread throughout the week.  Behavioral modification strategies: increasing water intake and planning for success.  Rebecca Dominguez has agreed to follow-up with our clinic in 2-3 weeks. She was informed of the importance of frequent follow-up visits to maximize her success with intensive lifestyle modifications for her multiple health conditions.   Objective:   Blood pressure 118/78, pulse 80, temperature 98.4 F (36.9 C), temperature source Oral, height 5\' 5"  (1.651 m), weight 292 lb (132.5 kg), SpO2 98 %. Body mass index is 48.59 kg/m.  General: Cooperative, alert, well developed, in no acute distress. HEENT: Conjunctivae and lids unremarkable. Cardiovascular:  Regular rhythm.  Lungs: Normal work of breathing. Neurologic: No focal deficits.   Lab Results  Component Value Date   CREATININE 0.71 09/12/2018   BUN 16 09/12/2018   NA 136 09/12/2018   K 4.2 09/12/2018   CL 100 09/12/2018   CO2 21 09/12/2018   Lab Results  Component Value Date   ALT 14 09/12/2018   AST 13 09/12/2018   ALKPHOS 92 09/12/2018   BILITOT 0.3 09/12/2018   Lab Results  Component Value Date   HGBA1C 5.3 09/12/2018   Lab Results  Component Value Date   INSULIN 8.8 09/12/2018   Lab Results  Component Value Date   TSH 4.350 09/12/2018   Lab Results  Component Value Date   CHOL 152 09/12/2018   HDL 63 09/12/2018   LDLCALC 79 09/12/2018   TRIG 48 09/12/2018   Lab Results  Component Value Date   WBC 6.8 09/12/2018   HGB 12.6 09/12/2018   HCT 38.5 09/12/2018   MCV 82 09/12/2018   Lab Results  Component Value Date   IRON 47 09/12/2018   TIBC 405 09/12/2018   FERRITIN 17 09/12/2018   Attestation Statements:   Reviewed by clinician on day of visit: allergies, medications, problem list, medical history, surgical history, family history, social history, and previous encounter notes.  I, 09/14/2018, CMA, am acting as Insurance claims handler for Energy manager  Earlene Plater, DO.  I have reviewed the above documentation for accuracy and completeness, and I agree with the above. Helane Rima, DO

## 2019-11-29 ENCOUNTER — Ambulatory Visit (INDEPENDENT_AMBULATORY_CARE_PROVIDER_SITE_OTHER): Payer: BC Managed Care – PPO | Admitting: Family Medicine

## 2019-11-29 ENCOUNTER — Encounter (INDEPENDENT_AMBULATORY_CARE_PROVIDER_SITE_OTHER): Payer: Self-pay | Admitting: Family Medicine

## 2019-11-29 ENCOUNTER — Other Ambulatory Visit: Payer: Self-pay

## 2019-11-29 VITALS — BP 111/70 | HR 72 | Temp 97.9°F | Ht 65.0 in | Wt 292.0 lb

## 2019-11-29 DIAGNOSIS — Z6841 Body Mass Index (BMI) 40.0 and over, adult: Secondary | ICD-10-CM

## 2019-11-29 DIAGNOSIS — F419 Anxiety disorder, unspecified: Secondary | ICD-10-CM | POA: Diagnosis not present

## 2019-11-29 NOTE — Progress Notes (Signed)
Chief Complaint:   OBESITY Rebecca Dominguez is here to discuss her progress with her obesity treatment plan along with follow-up of her obesity related diagnoses. Rebecca Dominguez is on the Category 3 Plan and states she is following her eating plan approximately 65% of the time. Rebecca Dominguez states she is being more mindful of her steps.  Today's visit was #: 24 Starting weight: 338 lbs Starting date: 09/12/2018 Today's weight: 292 lbs Today's date: 11/29/2019 Total lbs lost to date: 46 Total lbs lost since last in-office visit: 0  Interim History: Rebecca Dominguez has done well maintaining her weight since her last visit. She has increased family stress and pet stress and was unable to follow her plan as closely, but she still did well with being mindful and making healthier choices overall.  Subjective:   1. Anxiety Rebecca Dominguez feels the Lexapro is helping her mood although she has had extra stressors lately. She is doing well identifying and controlling her emotional eating.  Assessment/Plan:   1. Anxiety Behavior modification techniques were discussed today to help Rebecca Dominguez deal with her anxiety. Rebecca Dominguez will continue Lexapro as is, and will continue to follow up. Orders and follow up as documented in patient record.   2. Class 3 severe obesity with serious comorbidity and body mass index (BMI) of 45.0 to 49.9 in adult, unspecified obesity type Rebecca Dominguez) Rebecca Dominguez is currently in the action stage of change. As such, her goal is to continue with weight loss efforts. She has agreed to the Category 3 Plan.   Exercise goals: As is.  Behavioral modification strategies: emotional eating strategies and dealing with family or coworker sabotage.  Rebecca Dominguez has agreed to follow-up with our clinic in 3 weeks. She was informed of the importance of frequent follow-up visits to maximize her success with intensive lifestyle modifications for her multiple health conditions.   Objective:   Blood pressure 111/70,  pulse 72, temperature 97.9 F (36.6 C), temperature source Oral, height 5\' 5"  (1.651 m), weight 292 lb (132.5 kg), SpO2 99 %. Body mass index is 48.59 kg/m.  General: Cooperative, alert, well developed, in no acute distress. HEENT: Conjunctivae and lids unremarkable. Cardiovascular: Regular rhythm.  Lungs: Normal work of breathing. Neurologic: No focal deficits.   Lab Results  Component Value Date   CREATININE 0.71 09/12/2018   BUN 16 09/12/2018   NA 136 09/12/2018   K 4.2 09/12/2018   CL 100 09/12/2018   CO2 21 09/12/2018   Lab Results  Component Value Date   ALT 14 09/12/2018   AST 13 09/12/2018   ALKPHOS 92 09/12/2018   BILITOT 0.3 09/12/2018   Lab Results  Component Value Date   HGBA1C 5.3 09/12/2018   Lab Results  Component Value Date   INSULIN 8.8 09/12/2018   Lab Results  Component Value Date   TSH 4.350 09/12/2018   Lab Results  Component Value Date   CHOL 152 09/12/2018   HDL 63 09/12/2018   LDLCALC 79 09/12/2018   TRIG 48 09/12/2018   Lab Results  Component Value Date   WBC 6.8 09/12/2018   HGB 12.6 09/12/2018   HCT 38.5 09/12/2018   MCV 82 09/12/2018   Lab Results  Component Value Date   IRON 47 09/12/2018   TIBC 405 09/12/2018   FERRITIN 17 09/12/2018   Attestation Statements:   Reviewed by clinician on day of visit: allergies, medications, problem list, medical history, surgical history, family history, social history, and previous encounter notes.  Time spent on visit including  pre-visit chart review and post-visit care and charting was 32 minutes.    I, Rebecca Dominguez, am acting as transcriptionist for Rebecca Quince, MD.  I have reviewed the above documentation for accuracy and completeness, and I agree with the above. -  .mec

## 2019-12-27 ENCOUNTER — Encounter (INDEPENDENT_AMBULATORY_CARE_PROVIDER_SITE_OTHER): Payer: Self-pay | Admitting: Family Medicine

## 2019-12-27 ENCOUNTER — Ambulatory Visit (INDEPENDENT_AMBULATORY_CARE_PROVIDER_SITE_OTHER): Payer: BC Managed Care – PPO | Admitting: Family Medicine

## 2019-12-27 ENCOUNTER — Other Ambulatory Visit: Payer: Self-pay

## 2019-12-27 VITALS — BP 111/66 | HR 78 | Temp 97.9°F | Ht 65.0 in | Wt 287.0 lb

## 2019-12-27 DIAGNOSIS — D538 Other specified nutritional anemias: Secondary | ICD-10-CM | POA: Diagnosis not present

## 2019-12-27 DIAGNOSIS — Z6841 Body Mass Index (BMI) 40.0 and over, adult: Secondary | ICD-10-CM

## 2019-12-27 DIAGNOSIS — Z9189 Other specified personal risk factors, not elsewhere classified: Secondary | ICD-10-CM

## 2019-12-27 DIAGNOSIS — E559 Vitamin D deficiency, unspecified: Secondary | ICD-10-CM

## 2019-12-27 DIAGNOSIS — E538 Deficiency of other specified B group vitamins: Secondary | ICD-10-CM | POA: Diagnosis not present

## 2019-12-27 NOTE — Progress Notes (Signed)
Chief Complaint:   OBESITY Rebecca Dominguez is here to discuss her progress with her obesity treatment plan along with follow-up of her obesity related diagnoses. Rebecca Dominguez is on the Category 3 Plan and states she is following her eating plan approximately 70% of the time. Rebecca Dominguez states she is more aware of her movement and steps.  Today's visit was #: 25 Starting weight: 338 lbs Starting date: 09/12/2018 Today's weight: 287 lbs Today's date: 12/27/2019 Total lbs lost to date: 51 Total lbs lost since last in-office visit: 5  Interim History: Rebecca Dominguez is status post gastric bypass. She has done well with weight loss on her Category 3 plan. Her hunger is controlled.  Subjective:   1. Vitamin D deficiency Rebecca Dominguez has a diagnosis of Vit D deficiency with a history of gastric bypass, and she is due to have checked.  2. Vitamin B 12 deficiency Rebecca Dominguez has a diagnosis of B12 deficiency with a history of gastric bypass, and she is due to have labs checked.   3. Other specified nutritional anemias Rebecca Dominguez has a diagnosis of anemia with a history of gastric bypass, and she is due to have labs checked.  4. At risk for malnutrition Rebecca Dominguez is at increased risk for malnutrition due to history of gastric bypass.  Assessment/Plan:   1. Vitamin D deficiency Low Vitamin D level contributes to fatigue and are associated with obesity, breast, and colon cancer. We will check labs today. Rebecca Dominguez will follow-up for routine testing of Vitamin D, at least 2-3 times per year to avoid over-replacement.  - VITAMIN D 25 Hydroxy (Vit-D Deficiency, Fractures)  2. Vitamin B 12 deficiency The diagnosis was reviewed with the patient. We will continue to monitor. We will check labs today. Orders and follow up as documented in patient record.  - Lipid Panel With LDL/HDL Ratio - Vitamin B12  3. Other specified nutritional anemias We will check labs today. Rebecca Dominguez will follow up as  directed.  - Comprehensive metabolic panel - CBC with Differential/Platelet - Hemoglobin A1c - Insulin, random - Lipid Panel With LDL/HDL Ratio - VITAMIN D 25 Hydroxy (Vit-D Deficiency, Fractures) - Vitamin B12  4. At risk for malnutrition Rebecca Dominguez was given approximately 15 minutes of counseling today regarding prevention of malnutrition and ways to meet macronutrient goals..   5. Class 3 severe obesity with serious comorbidity and body mass index (BMI) of 45.0 to 49.9 in adult, unspecified obesity type [employer] Rebecca Dominguez is currently in the action stage of change. As such, her goal is to continue with weight loss efforts. She has agreed to the Category 3 Plan.   Exercise goals: As is.  Behavioral modification strategies: meal planning and cooking strategies.  Rebecca Dominguez has agreed to follow-up with our clinic in 4 weeks. She was informed of the importance of frequent follow-up visits to maximize her success with intensive lifestyle modifications for her multiple health conditions.   Rebecca Dominguez was informed we would discuss her lab results at her next visit unless there is a critical issue that needs to be addressed sooner. Rebecca Dominguez agreed to keep her next visit at the agreed upon time to discuss these results.  Objective:   Blood pressure 111/66, pulse 78, temperature 97.9 F (36.6 C), temperature source Oral, height 5\' 5"  (1.651 m), weight 287 lb (130.2 kg), SpO2 98 %. Body mass index is 47.76 kg/m.  General: Cooperative, alert, well developed, in no acute distress. HEENT: Conjunctivae and lids unremarkable. Cardiovascular: Regular rhythm.  Lungs: Normal work of breathing. Neurologic:  No focal deficits.   Lab Results  Component Value Date   CREATININE 0.71 09/12/2018   BUN 16 09/12/2018   NA 136 09/12/2018   K 4.2 09/12/2018   CL 100 09/12/2018   CO2 21 09/12/2018   Lab Results  Component Value Date   ALT 14 09/12/2018   AST 13 09/12/2018   ALKPHOS 92 09/12/2018    BILITOT 0.3 09/12/2018   Lab Results  Component Value Date   HGBA1C 5.3 09/12/2018   Lab Results  Component Value Date   INSULIN 8.8 09/12/2018   Lab Results  Component Value Date   TSH 4.350 09/12/2018   Lab Results  Component Value Date   CHOL 152 09/12/2018   HDL 63 09/12/2018   LDLCALC 79 09/12/2018   TRIG 48 09/12/2018   Lab Results  Component Value Date   WBC 6.8 09/12/2018   HGB 12.6 09/12/2018   HCT 38.5 09/12/2018   MCV 82 09/12/2018   Lab Results  Component Value Date   IRON 47 09/12/2018   TIBC 405 09/12/2018   FERRITIN 17 09/12/2018   Attestation Statements:   Reviewed by clinician on day of visit: allergies, medications, problem list, medical history, surgical history, family history, social history, and previous encounter notes.   I, Trixie Dredge, am acting as Location manager for Dennard Nip, MD.  I have reviewed the above documentation for accuracy and completeness, and I agree with the above. -  Dennard Nip, MD

## 2019-12-28 LAB — CBC WITH DIFFERENTIAL/PLATELET
Basophils Absolute: 0 x10E3/uL (ref 0.0–0.2)
Basos: 1 %
EOS (ABSOLUTE): 0.2 x10E3/uL (ref 0.0–0.4)
Eos: 4 %
Hematocrit: 44 % (ref 34.0–46.6)
Hemoglobin: 14.2 g/dL (ref 11.1–15.9)
Immature Grans (Abs): 0 x10E3/uL (ref 0.0–0.1)
Immature Granulocytes: 0 %
Lymphocytes Absolute: 1.8 x10E3/uL (ref 0.7–3.1)
Lymphs: 30 %
MCH: 29.3 pg (ref 26.6–33.0)
MCHC: 32.3 g/dL (ref 31.5–35.7)
MCV: 91 fL (ref 79–97)
Monocytes Absolute: 0.4 x10E3/uL (ref 0.1–0.9)
Monocytes: 7 %
Neutrophils Absolute: 3.6 x10E3/uL (ref 1.4–7.0)
Neutrophils: 58 %
Platelets: 231 x10E3/uL (ref 150–450)
RBC: 4.84 x10E6/uL (ref 3.77–5.28)
RDW: 13.1 % (ref 11.7–15.4)
WBC: 6.1 x10E3/uL (ref 3.4–10.8)

## 2019-12-28 LAB — VITAMIN D 25 HYDROXY (VIT D DEFICIENCY, FRACTURES): Vit D, 25-Hydroxy: 73.3 ng/mL (ref 30.0–100.0)

## 2019-12-28 LAB — COMPREHENSIVE METABOLIC PANEL WITH GFR
ALT: 28 IU/L (ref 0–32)
AST: 24 IU/L (ref 0–40)
Albumin/Globulin Ratio: 1.6 (ref 1.2–2.2)
Albumin: 4.2 g/dL (ref 3.8–4.8)
Alkaline Phosphatase: 90 IU/L (ref 48–121)
BUN/Creatinine Ratio: 22 (ref 9–23)
BUN: 16 mg/dL (ref 6–24)
Bilirubin Total: 0.3 mg/dL (ref 0.0–1.2)
CO2: 23 mmol/L (ref 20–29)
Calcium: 10 mg/dL (ref 8.7–10.2)
Chloride: 103 mmol/L (ref 96–106)
Creatinine, Ser: 0.74 mg/dL (ref 0.57–1.00)
GFR calc Af Amer: 115 mL/min/1.73
GFR calc non Af Amer: 100 mL/min/1.73
Globulin, Total: 2.7 g/dL (ref 1.5–4.5)
Glucose: 82 mg/dL (ref 65–99)
Potassium: 4.2 mmol/L (ref 3.5–5.2)
Sodium: 141 mmol/L (ref 134–144)
Total Protein: 6.9 g/dL (ref 6.0–8.5)

## 2019-12-28 LAB — INSULIN, RANDOM: INSULIN: 7.9 u[IU]/mL (ref 2.6–24.9)

## 2019-12-28 LAB — LIPID PANEL WITH LDL/HDL RATIO
Cholesterol, Total: 157 mg/dL (ref 100–199)
HDL: 66 mg/dL
LDL Chol Calc (NIH): 81 mg/dL (ref 0–99)
LDL/HDL Ratio: 1.2 ratio (ref 0.0–3.2)
Triglycerides: 46 mg/dL (ref 0–149)
VLDL Cholesterol Cal: 10 mg/dL (ref 5–40)

## 2019-12-28 LAB — HEMOGLOBIN A1C
Est. average glucose Bld gHb Est-mCnc: 103 mg/dL
Hgb A1c MFr Bld: 5.2 % (ref 4.8–5.6)

## 2019-12-28 LAB — VITAMIN B12: Vitamin B-12: >2000 pg/mL — ABNORMAL HIGH (ref 232–1245)

## 2020-01-03 IMAGING — MG DIGITAL SCREENING BILAT W/ TOMO W/ CAD
8 of 14 series · 8 of 40 positions shown · non-contrast
Comparison: Previous exam(s).

CLINICAL DATA: Screening.

EXAM:
DIGITAL SCREENING BILATERAL MAMMOGRAM WITH TOMO AND CAD

[R MLO synth-2D (1 of 2)]
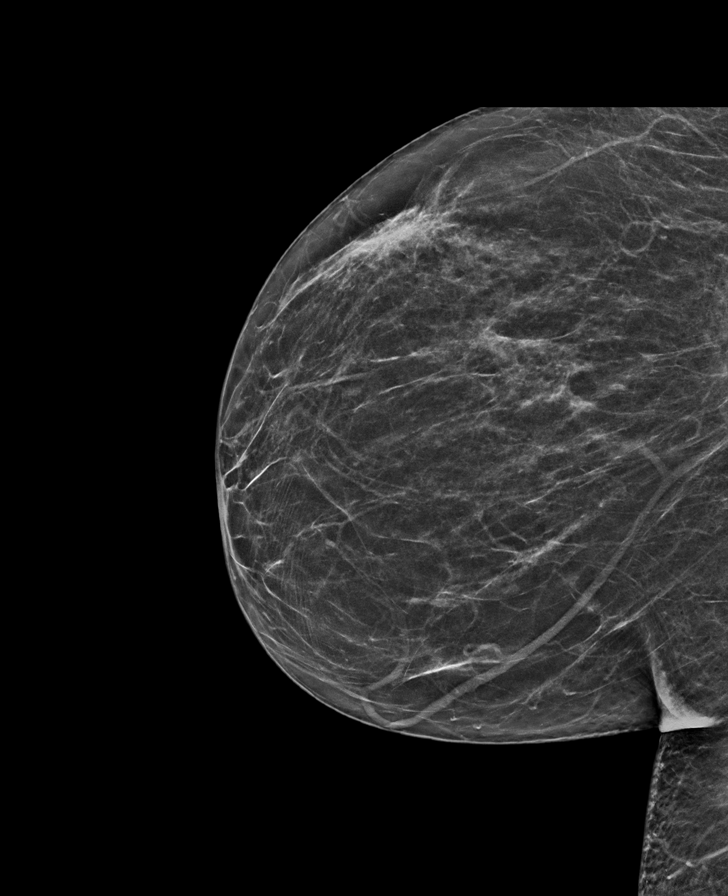

[R MLO synth-2D (2 of 2)]
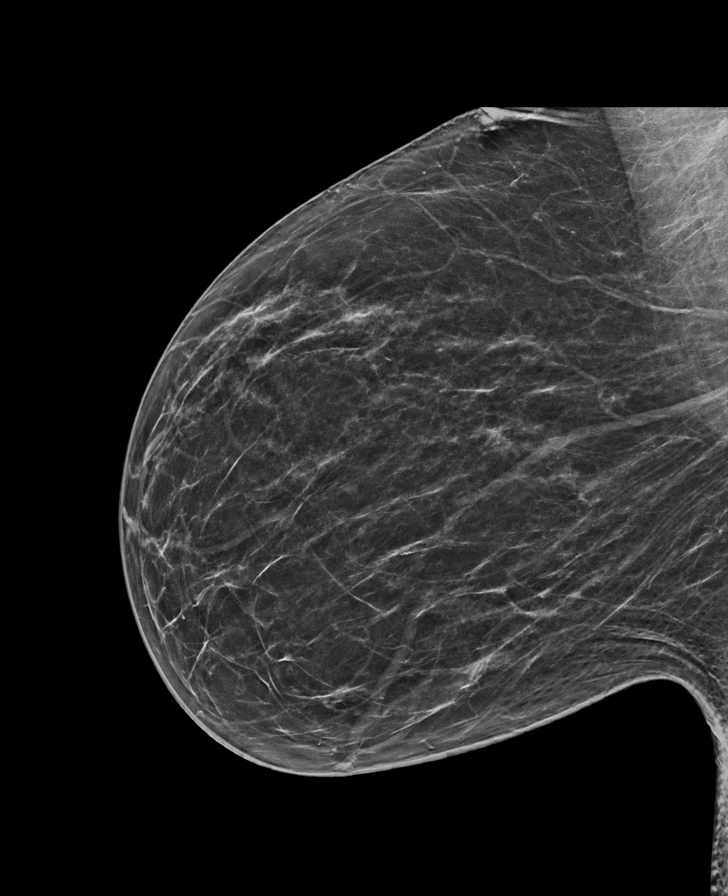

[L MLO synth-2D (1 of 2)]
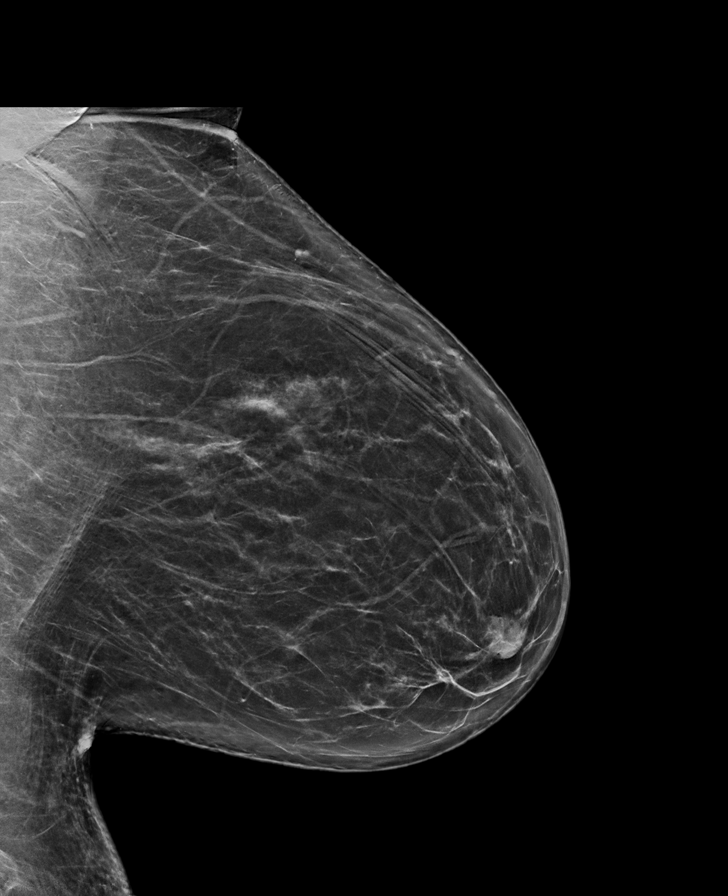

[L CC synth-2D (1 of 2)]
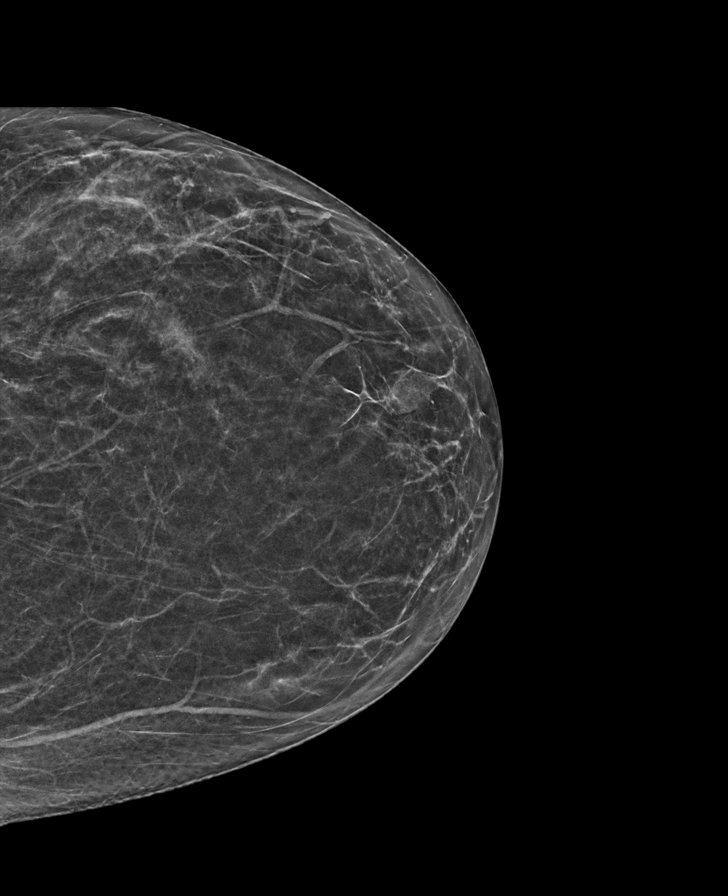

[L MLO synth-2D (2 of 2)]
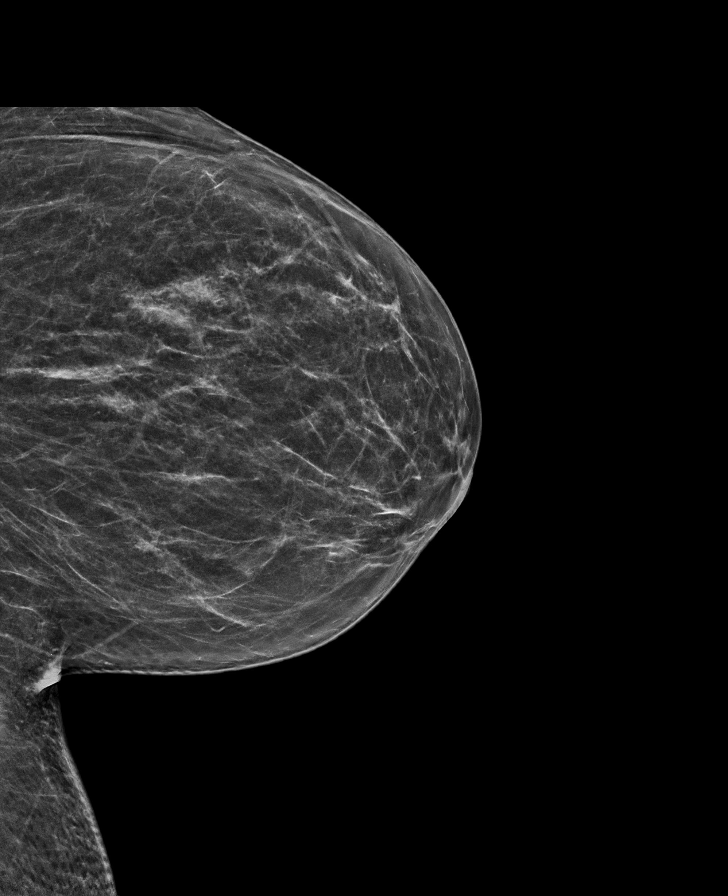

[L CC synth-2D (2 of 2)]
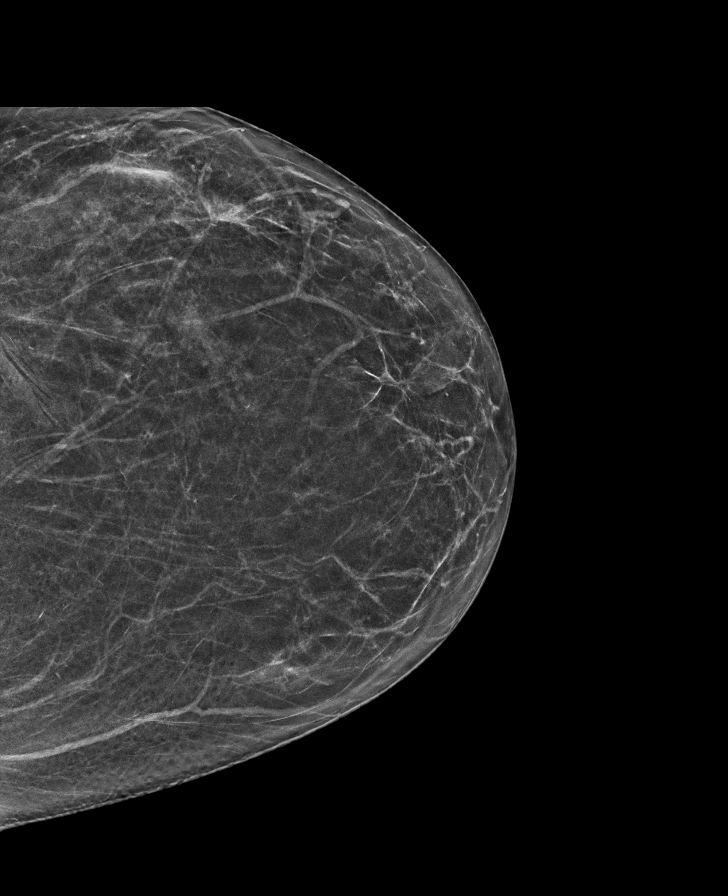

[R CC synth-2D]
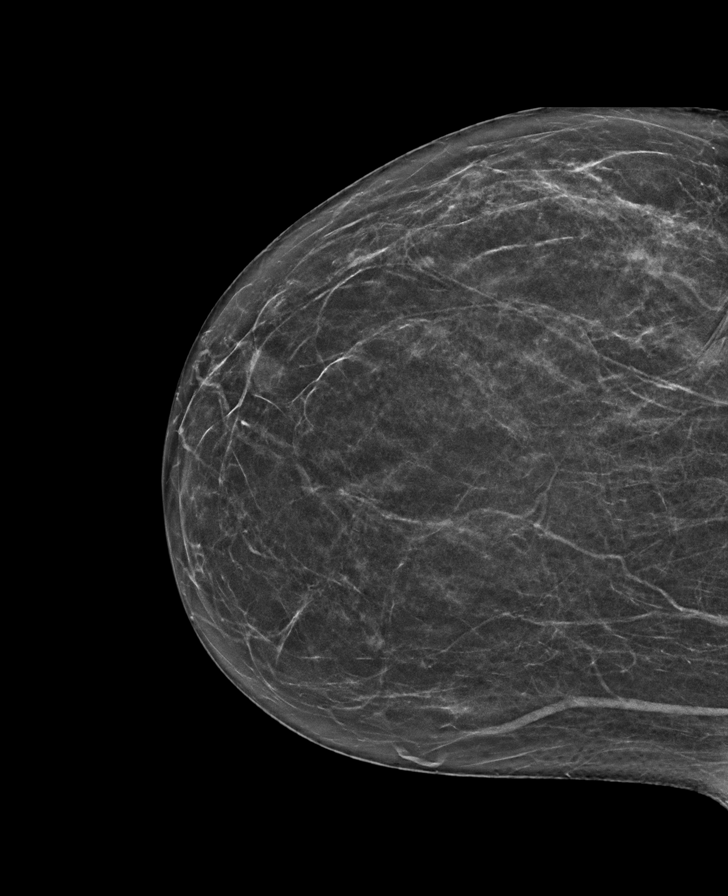

[L MLO tomo · tomo slice 39/78.0]
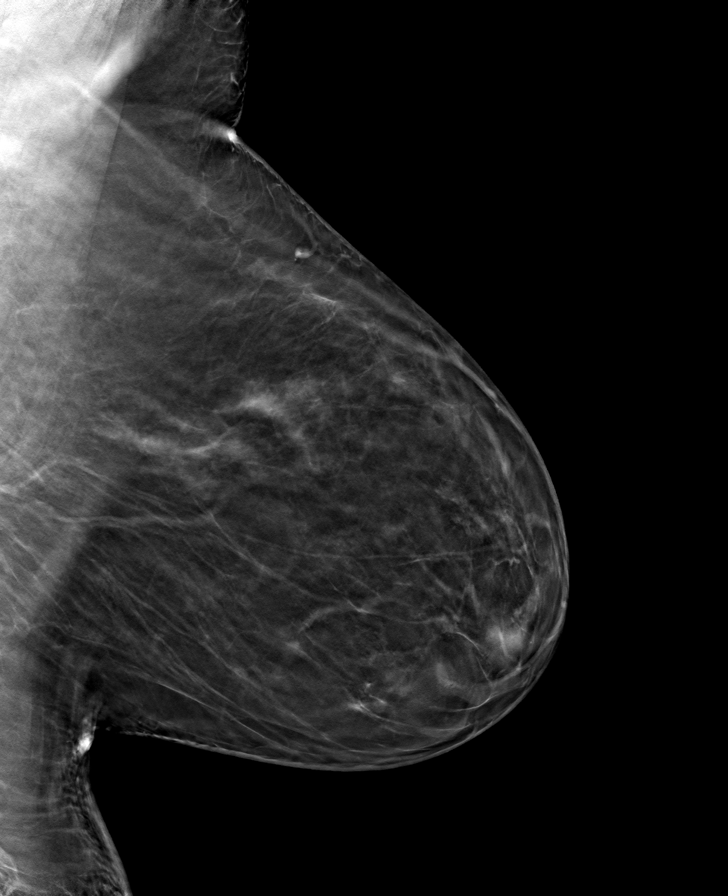

[8 of 40 positions shown; findings below may reference images not displayed]

ACR Breast Density Category b: There are scattered areas of
fibroglandular density.
FINDINGS: There are no findings suspicious for malignancy. Images were
processed with CAD.
IMPRESSION: No mammographic evidence of malignancy. A result letter of this
screening mammogram will be mailed directly to the patient.

RECOMMENDATION:
Screening mammogram in one year. (Code:[id number])

BI-RADS CATEGORY  1: Negative.

## 2020-01-18 ENCOUNTER — Encounter (INDEPENDENT_AMBULATORY_CARE_PROVIDER_SITE_OTHER): Payer: Self-pay

## 2020-01-18 ENCOUNTER — Other Ambulatory Visit (INDEPENDENT_AMBULATORY_CARE_PROVIDER_SITE_OTHER): Payer: Self-pay | Admitting: Family Medicine

## 2020-01-18 DIAGNOSIS — F3289 Other specified depressive episodes: Secondary | ICD-10-CM

## 2020-01-29 ENCOUNTER — Other Ambulatory Visit: Payer: Self-pay

## 2020-01-29 ENCOUNTER — Encounter (INDEPENDENT_AMBULATORY_CARE_PROVIDER_SITE_OTHER): Payer: Self-pay | Admitting: Family Medicine

## 2020-01-29 ENCOUNTER — Ambulatory Visit (INDEPENDENT_AMBULATORY_CARE_PROVIDER_SITE_OTHER): Payer: BC Managed Care – PPO | Admitting: Family Medicine

## 2020-01-29 VITALS — BP 108/71 | HR 76 | Temp 98.5°F | Ht 65.0 in | Wt 278.0 lb

## 2020-01-29 DIAGNOSIS — E538 Deficiency of other specified B group vitamins: Secondary | ICD-10-CM

## 2020-01-29 DIAGNOSIS — Z9189 Other specified personal risk factors, not elsewhere classified: Secondary | ICD-10-CM

## 2020-01-29 DIAGNOSIS — E559 Vitamin D deficiency, unspecified: Secondary | ICD-10-CM

## 2020-01-29 DIAGNOSIS — Z6841 Body Mass Index (BMI) 40.0 and over, adult: Secondary | ICD-10-CM

## 2020-01-31 NOTE — Progress Notes (Signed)
Chief Complaint:   OBESITY Rebecca Dominguez is here to discuss her progress with her obesity treatment plan along with follow-up of her obesity related diagnoses. Rebecca Dominguez is on the Category 3 Plan and states she is following her eating plan approximately 75-80% of the time. Rebecca Dominguez states she is doing 0 minutes 0 times per week.  Today's visit was #: 26 Starting weight: 338 lbs Starting date: 09/12/2018 Today's weight: 278 lbs Today's date: 01/29/2020 Total lbs lost to date: 60 Total lbs lost since last in-office visit: 9  Interim History: Rebecca Dominguez has done well with weight loss since her last visit. Her husband has started to eat healthy with her. Her hunger is mostly controlled.  Subjective:   1. Vitamin D deficiency Rebecca Dominguez is on Vit D 1,000 IU daily OTC and her multivitamins and Vit D is at goal. She is at risk of becoming over-replaced.  2. Vitamin B12 deficiency Rebecca Dominguez's B12 is over-replaced which is likely not causing problems but isn't helping either. She is on B12 OTC 500 mg daily and what is in her multivitamins.   3. At risk for side effect of medication Rebecca Dominguez is at risk for drug side effects due to higher risk of Vit D over-replacement.  Assessment/Plan:   1. Vitamin D deficiency Low Vitamin D level contributes to fatigue and are associated with obesity, breast, and colon cancer. Rebecca Dominguez agreed to discontinue OTC Vit D, and will continue multivitamins. She will follow-up for routine testing of Vitamin D, at least 2-3 times per year to avoid over-replacement. We will recheck labs in 3 months.  2. Vitamin B12 deficiency The diagnosis was reviewed with the patient. We will continue to monitor. Rebecca Dominguez will continue multivitamins and will discontinue B12. We will recheck labs in 3 months. Orders and follow up as documented in patient record.  3. At risk for side effect of medication Rebecca Dominguez was given approximately 15 minutes of drug side effect counseling  today.  We discussed side effect possibility and risk versus benefits. Rebecca Dominguez agreed to the medication and will contact this office if these side effects are intolerable.  Repetitive spaced learning was employed today to elicit superior memory formation and behavioral change.  4. Class 3 severe obesity with serious comorbidity and body mass index (BMI) of 45.0 to 49.9 in adult, unspecified obesity type Rebecca Dominguez) Rebecca Dominguez is currently in the action stage of change. As such, her goal is to continue with weight loss efforts. She has agreed to the Category 3 Plan.   Exercise goals: All adults should avoid inactivity. Some physical activity is better than none, and adults who participate in any amount of physical activity gain some health benefits.  Behavioral modification strategies: meal planning and cooking strategies and dealing with family or coworker sabotage.  Rebecca Dominguez has agreed to follow-up with our clinic in 4 weeks. She was informed of the importance of frequent follow-up visits to maximize her success with intensive lifestyle modifications for her multiple health conditions.   Objective:   Blood pressure 108/71, pulse 76, temperature 98.5 F (36.9 C), temperature source Oral, height 5\' 5"  (1.651 m), weight 278 lb (126.1 kg), SpO2 98 %. Body mass index is 46.26 kg/m.  General: Cooperative, alert, well developed, in no acute distress. HEENT: Conjunctivae and lids unremarkable. Cardiovascular: Regular rhythm.  Lungs: Normal work of breathing. Neurologic: No focal deficits.   Lab Results  Component Value Date   CREATININE 0.74 12/27/2019   BUN 16 12/27/2019   NA 141 12/27/2019  K 4.2 12/27/2019   CL 103 12/27/2019   CO2 23 12/27/2019   Lab Results  Component Value Date   ALT 28 12/27/2019   AST 24 12/27/2019   ALKPHOS 90 12/27/2019   BILITOT 0.3 12/27/2019   Lab Results  Component Value Date   HGBA1C 5.2 12/27/2019   HGBA1C 5.3 09/12/2018   Lab Results  Component  Value Date   INSULIN 7.9 12/27/2019   INSULIN 8.8 09/12/2018   Lab Results  Component Value Date   TSH 4.350 09/12/2018   Lab Results  Component Value Date   CHOL 157 12/27/2019   HDL 66 12/27/2019   LDLCALC 81 12/27/2019   TRIG 46 12/27/2019   Lab Results  Component Value Date   WBC 6.1 12/27/2019   HGB 14.2 12/27/2019   HCT 44.0 12/27/2019   MCV 91 12/27/2019   PLT 231 12/27/2019   Lab Results  Component Value Date   IRON 47 09/12/2018   TIBC 405 09/12/2018   FERRITIN 17 09/12/2018   Attestation Statements:   Reviewed by clinician on day of visit: allergies, medications, problem list, medical history, surgical history, family history, social history, and previous encounter notes.   I, Burt Knack, am acting as transcriptionist for Quillian Quince, MD.  I have reviewed the above documentation for accuracy and completeness, and I agree with the above. -  Quillian Quince, MD

## 2020-02-28 ENCOUNTER — Other Ambulatory Visit: Payer: Self-pay

## 2020-02-28 ENCOUNTER — Ambulatory Visit (INDEPENDENT_AMBULATORY_CARE_PROVIDER_SITE_OTHER): Payer: BC Managed Care – PPO | Admitting: Family Medicine

## 2020-02-28 ENCOUNTER — Encounter (INDEPENDENT_AMBULATORY_CARE_PROVIDER_SITE_OTHER): Payer: Self-pay | Admitting: Family Medicine

## 2020-02-28 VITALS — BP 113/70 | HR 89 | Temp 98.2°F | Ht 65.0 in | Wt 280.0 lb

## 2020-02-28 DIAGNOSIS — F3289 Other specified depressive episodes: Secondary | ICD-10-CM

## 2020-02-28 DIAGNOSIS — Z6841 Body Mass Index (BMI) 40.0 and over, adult: Secondary | ICD-10-CM | POA: Diagnosis not present

## 2020-02-29 NOTE — Progress Notes (Signed)
Chief Complaint:   OBESITY Daliah is here to discuss her progress with her obesity treatment plan along with follow-up of her obesity related diagnoses. Daylynn is on the Category 3 Plan and states she is following her eating plan approximately 75% of the time. Trinika states she is doing 0 minutes 0 times per week.  Today's visit was #: 27 Starting weight: 338 lbs Starting date: 09/12/2018 Today's weight: 280 lbs Today's date: 02/28/2020 Total lbs lost to date: 58 Total lbs lost since last in-office visit: 0  Interim History: Laterra has had increased stressors in life including working from home, and losing electricity, internet, etc. She has gotten off track somewhat but she is ready to get back to a structured plan.  Subjective:   1. Other depression with emotional eating Anyae has had significant stressors in the last month. She has had increased anxiety and hasn't been concentrating on weight loss as much.  Assessment/Plan:   1. Other depression with emotional eating Behavior modification techniques were discussed today to help Saraiah deal with her emotional/non-hunger eating behaviors. Kenika will continue Lexapro as is. Orders and follow up as documented in patient record.   2. Class 3 severe obesity with serious comorbidity and body mass index (BMI) of 45.0 to 49.9 in adult, unspecified obesity type [facility]) Laira is currently in the action stage of change. As such, her goal is to continue with weight loss efforts. She has agreed to the Category 3 Plan.   Behavioral modification strategies: increasing lean protein intake and meal planning and cooking strategies.  Nariya has agreed to follow-up with our clinic in 4 weeks. She was informed of the importance of frequent follow-up visits to maximize her success with intensive lifestyle modifications for her multiple health conditions.   Objective:   Blood pressure 113/70, pulse 89, temperature 98.2  F (36.8 C), temperature source Oral, height 5\' 5"  (1.651 m), weight 280 lb (127 kg), SpO2 99 %. Body mass index is 46.59 kg/m.  General: Cooperative, alert, well developed, in no acute distress. HEENT: Conjunctivae and lids unremarkable. Cardiovascular: Regular rhythm.  Lungs: Normal work of breathing. Neurologic: No focal deficits.   Lab Results  Component Value Date   CREATININE 0.74 12/27/2019   BUN 16 12/27/2019   NA 141 12/27/2019   K 4.2 12/27/2019   CL 103 12/27/2019   CO2 23 12/27/2019   Lab Results  Component Value Date   ALT 28 12/27/2019   AST 24 12/27/2019   ALKPHOS 90 12/27/2019   BILITOT 0.3 12/27/2019   Lab Results  Component Value Date   HGBA1C 5.2 12/27/2019   HGBA1C 5.3 09/12/2018   Lab Results  Component Value Date   INSULIN 7.9 12/27/2019   INSULIN 8.8 09/12/2018   Lab Results  Component Value Date   TSH 4.350 09/12/2018   Lab Results  Component Value Date   CHOL 157 12/27/2019   HDL 66 12/27/2019   LDLCALC 81 12/27/2019   TRIG 46 12/27/2019   Lab Results  Component Value Date   WBC 6.1 12/27/2019   HGB 14.2 12/27/2019   HCT 44.0 12/27/2019   MCV 91 12/27/2019   PLT 231 12/27/2019   Lab Results  Component Value Date   IRON 47 09/12/2018   TIBC 405 09/12/2018   FERRITIN 17 09/12/2018   Attestation Statements:   Reviewed by clinician on day of visit: allergies, medications, problem list, medical history, surgical history, family history, social history, and previous encounter notes.  Time spent on visit including pre-visit chart review and post-visit care and charting was 25 minutes.    I, Burt Knack, am acting as transcriptionist for Quillian Quince, MD.  I have reviewed the above documentation for accuracy and completeness, and I agree with the above. -  Quillian Quince, MD

## 2020-03-27 ENCOUNTER — Encounter (INDEPENDENT_AMBULATORY_CARE_PROVIDER_SITE_OTHER): Payer: Self-pay | Admitting: Family Medicine

## 2020-03-27 ENCOUNTER — Ambulatory Visit (INDEPENDENT_AMBULATORY_CARE_PROVIDER_SITE_OTHER): Payer: BC Managed Care – PPO | Admitting: Family Medicine

## 2020-03-27 ENCOUNTER — Other Ambulatory Visit: Payer: Self-pay

## 2020-03-27 VITALS — BP 106/67 | HR 82 | Temp 98.0°F | Ht 65.0 in | Wt 277.0 lb

## 2020-03-27 DIAGNOSIS — M545 Low back pain, unspecified: Secondary | ICD-10-CM

## 2020-03-27 DIAGNOSIS — Z6841 Body Mass Index (BMI) 40.0 and over, adult: Secondary | ICD-10-CM

## 2020-03-27 DIAGNOSIS — G8929 Other chronic pain: Secondary | ICD-10-CM

## 2020-03-27 NOTE — Progress Notes (Signed)
Chief Complaint:   OBESITY Rebecca Dominguez is here to discuss her progress with her obesity treatment plan along with follow-up of her obesity related diagnoses. Rebecca Dominguez is on the Category 3 Plan and states she is following her eating plan approximately 75% of the time. Rebecca Dominguez states she is doing 0 minutes 0 times per week.  Today's visit was #: 28 Starting weight: 338 lbs Starting date: 09/12/2018 Today's weight: 277 lbs Today's date: 03/27/2020 Total lbs lost to date: 61 Total lbs lost since last in-office visit: 3  Interim History: Rebecca Dominguez continues to lose weight on her Category 3 plan. She has had increased back pain which limits her activity. Her hunger is mostly controlled. She is happy with her eating plan overall.  Subjective:   1. Chronic low back pain without sciatica, unspecified back pain laterality Rebecca Dominguez notes increased lower back pain in the last 1-2 weeks. She is wearing arch support shoes and has minimized her activity.  Assessment/Plan:   1. Chronic low back pain without sciatica, unspecified back pain laterality Rebecca Dominguez is to slowly increase activity as tolerated and advised against activity which flares up her back pain.   2. Class 3 severe obesity with serious comorbidity and body mass index (BMI) of 45.0 to 49.9 in adult, unspecified obesity type Rebecca Dominguez) Rebecca Dominguez is currently in the action stage of change. As such, her goal is to continue with weight loss efforts. She has agreed to the Category 3 Plan.   Exercise goals: All adults should avoid inactivity. Some physical activity is better than none, and adults who participate in any amount of physical activity gain some health benefits.  Behavioral modification strategies: increasing lean protein intake and meal planning and cooking strategies.  Rebecca Dominguez has agreed to follow-up with our clinic in 3 to 4 weeks. She was informed of the importance of frequent follow-up visits to maximize her success with  intensive lifestyle modifications for her multiple health conditions.   Objective:   Blood pressure 106/67, pulse 82, temperature 98 F (36.7 C), height 5\' 5"  (1.651 m), weight 277 lb (125.6 kg), SpO2 97 %. Body mass index is 46.1 kg/m.  General: Cooperative, alert, well developed, in no acute distress. HEENT: Conjunctivae and lids unremarkable. Cardiovascular: Regular rhythm.  Lungs: Normal work of breathing. Neurologic: No focal deficits.   Lab Results  Component Value Date   CREATININE 0.74 12/27/2019   BUN 16 12/27/2019   NA 141 12/27/2019   K 4.2 12/27/2019   CL 103 12/27/2019   CO2 23 12/27/2019   Lab Results  Component Value Date   ALT 28 12/27/2019   AST 24 12/27/2019   ALKPHOS 90 12/27/2019   BILITOT 0.3 12/27/2019   Lab Results  Component Value Date   HGBA1C 5.2 12/27/2019   HGBA1C 5.3 09/12/2018   Lab Results  Component Value Date   INSULIN 7.9 12/27/2019   INSULIN 8.8 09/12/2018   Lab Results  Component Value Date   TSH 4.350 09/12/2018   Lab Results  Component Value Date   CHOL 157 12/27/2019   HDL 66 12/27/2019   LDLCALC 81 12/27/2019   TRIG 46 12/27/2019   Lab Results  Component Value Date   WBC 6.1 12/27/2019   HGB 14.2 12/27/2019   HCT 44.0 12/27/2019   MCV 91 12/27/2019   PLT 231 12/27/2019   Lab Results  Component Value Date   IRON 47 09/12/2018   TIBC 405 09/12/2018   FERRITIN 17 09/12/2018   Attestation Statements:  Reviewed by clinician on day of visit: allergies, medications, problem list, medical history, surgical history, family history, social history, and previous encounter notes.  Time spent on visit including pre-visit chart review and post-visit care and charting was 30 minutes.    I, Trixie Dredge, am acting as transcriptionist for Dennard Nip, MD.  I have reviewed the above documentation for accuracy and completeness, and I agree with the above. -  Dennard Nip, MD

## 2020-04-16 ENCOUNTER — Other Ambulatory Visit (INDEPENDENT_AMBULATORY_CARE_PROVIDER_SITE_OTHER): Payer: Self-pay | Admitting: Family Medicine

## 2020-04-16 DIAGNOSIS — F3289 Other specified depressive episodes: Secondary | ICD-10-CM

## 2020-04-24 ENCOUNTER — Encounter (INDEPENDENT_AMBULATORY_CARE_PROVIDER_SITE_OTHER): Payer: Self-pay | Admitting: Family Medicine

## 2020-04-29 ENCOUNTER — Other Ambulatory Visit: Payer: Self-pay

## 2020-04-29 ENCOUNTER — Ambulatory Visit (INDEPENDENT_AMBULATORY_CARE_PROVIDER_SITE_OTHER): Payer: BC Managed Care – PPO | Admitting: Family Medicine

## 2020-04-29 VITALS — BP 112/73 | HR 76 | Temp 98.0°F | Ht 65.0 in | Wt 267.0 lb

## 2020-04-29 DIAGNOSIS — F3289 Other specified depressive episodes: Secondary | ICD-10-CM

## 2020-04-29 DIAGNOSIS — F32A Depression, unspecified: Secondary | ICD-10-CM | POA: Insufficient documentation

## 2020-04-29 DIAGNOSIS — Z6841 Body Mass Index (BMI) 40.0 and over, adult: Secondary | ICD-10-CM

## 2020-04-29 MED ORDER — ESCITALOPRAM OXALATE 10 MG PO TABS: ORAL_TABLET | ORAL | 0 refills | Status: DC

## 2020-04-29 NOTE — Progress Notes (Signed)
Chief Complaint:   OBESITY Rebecca Dominguez is here to discuss her progress with her obesity treatment plan along with follow-up of her obesity related diagnoses. Rebecca Dominguez is on the Category 3 Plan and states she is following her eating plan approximately 75-80% of the time. Rebecca Dominguez states she is walking 4,000-4,500 steps daily 3 times per week.  Today's visit was #: 29 Starting weight: 338 lbs Starting date: 09/12/2018 Today's weight: 267 lbs Today's date: 04/29/2020 Total lbs lost to date: 71 Total lbs lost since last in-office visit: 10  Interim History: Rebecca Dominguez continues to do  Well with weight loss. She has been more active this last week doing closet constriction. She is mindful of her food choices.  Subjective:   1. Other depression with emotional eating Rebecca Dominguez has been out of her Lexapro for 2-3 days due to a refill error. She is not showing any side effects of being out yet.  Assessment/Plan:   1. Other depression with emotional eating Behavior modification techniques were discussed today to help Rebecca Dominguez deal with her emotional/non-hunger eating behaviors. We will refill Lexapro for 90 days, with no refill and will follow up in 4 weeks. Orders and follow up as documented in patient record.   - escitalopram (LEXAPRO) 10 MG tablet; TAKE 1 TABLET(10 MG) BY MOUTH EVERY MORNING  Dispense: 90 tablet; Refill: 0  2. Class 3 severe obesity with serious comorbidity and body mass index (BMI) of 40.0 to 44.9 in adult, unspecified obesity type [facility]) Rebecca Dominguez is currently in the action stage of change. As such, her goal is to continue with weight loss efforts. She has agreed to the Category 3 Plan.   Exercise goals: As is.  Behavioral modification strategies: increasing lean protein intake and meal planning and cooking strategies.  Rebecca Dominguez has agreed to follow-up with our clinic in 4 weeks. She was informed of the importance of frequent follow-up visits to maximize her  success with intensive lifestyle modifications for her multiple health conditions.   Objective:   Blood pressure 112/73, pulse 76, temperature 98 F (36.7 C), height 5\' 5"  (1.651 m), weight 267 lb (121.1 kg), SpO2 98 %. Body mass index is 44.43 kg/m.  General: Cooperative, alert, well developed, in no acute distress. HEENT: Conjunctivae and lids unremarkable. Cardiovascular: Regular rhythm.  Lungs: Normal work of breathing. Neurologic: No focal deficits.   Lab Results  Component Value Date   CREATININE 0.74 12/27/2019   BUN 16 12/27/2019   NA 141 12/27/2019   K 4.2 12/27/2019   CL 103 12/27/2019   CO2 23 12/27/2019   Lab Results  Component Value Date   ALT 28 12/27/2019   AST 24 12/27/2019   ALKPHOS 90 12/27/2019   BILITOT 0.3 12/27/2019   Lab Results  Component Value Date   HGBA1C 5.2 12/27/2019   HGBA1C 5.3 09/12/2018   Lab Results  Component Value Date   INSULIN 7.9 12/27/2019   INSULIN 8.8 09/12/2018   Lab Results  Component Value Date   TSH 4.350 09/12/2018   Lab Results  Component Value Date   CHOL 157 12/27/2019   HDL 66 12/27/2019   LDLCALC 81 12/27/2019   TRIG 46 12/27/2019   Lab Results  Component Value Date   WBC 6.1 12/27/2019   HGB 14.2 12/27/2019   HCT 44.0 12/27/2019   MCV 91 12/27/2019   PLT 231 12/27/2019   Lab Results  Component Value Date   IRON 47 09/12/2018   TIBC 405 09/12/2018   FERRITIN  17 09/12/2018   Attestation Statements:   Reviewed by clinician on day of visit: allergies, medications, problem list, medical history, surgical history, family history, social history, and previous encounter notes.  Time spent on visit including pre-visit chart review and post-visit care and charting was 30 minutes.    I, Burt Knack, am acting as transcriptionist for Quillian Quince, MD.  I have reviewed the above documentation for accuracy and completeness, and I agree with the above. -  Quillian Quince, MD

## 2020-05-29 ENCOUNTER — Encounter (INDEPENDENT_AMBULATORY_CARE_PROVIDER_SITE_OTHER): Payer: Self-pay | Admitting: Family Medicine

## 2020-05-29 ENCOUNTER — Ambulatory Visit (INDEPENDENT_AMBULATORY_CARE_PROVIDER_SITE_OTHER): Payer: BC Managed Care – PPO | Admitting: Family Medicine

## 2020-05-29 ENCOUNTER — Other Ambulatory Visit: Payer: Self-pay

## 2020-05-29 VITALS — BP 119/78 | HR 75 | Temp 98.4°F | Ht 65.0 in | Wt 271.0 lb

## 2020-05-29 DIAGNOSIS — Z6841 Body Mass Index (BMI) 40.0 and over, adult: Secondary | ICD-10-CM

## 2020-05-29 DIAGNOSIS — Z23 Encounter for immunization: Secondary | ICD-10-CM

## 2020-05-29 DIAGNOSIS — F3289 Other specified depressive episodes: Secondary | ICD-10-CM

## 2020-05-29 NOTE — Progress Notes (Signed)
Chief Complaint:   OBESITY Rebecca Dominguez is here to discuss her progress with her obesity treatment plan along with follow-up of her obesity related diagnoses. Rebecca Dominguez is on the Category 3 Plan and states she is following her eating plan approximately 50% of the time. Rebecca Dominguez states she is doing 0 minutes 0 times per week.  Today's visit was #: 30 Starting weight: 338 lbs Starting date: 09/12/2018 Today's weight: 271 lbs Today's date: 05/29/2020 Total lbs lost to date: 67 Total lbs lost since last in-office visit: 0  Interim History: Rebecca Dominguez did some traveling since her last visit, and did a lot of eating out but she was mindful of her food choices. She feels she is retaining fluid and notes puffy feet.  Subjective:   1. COVID-19 vaccine regimen to maintain immunity completed Rebecca Dominguez got her COVID booster on October 23rd, and has had the pfizer (all 3). She denies side effects.  2. Other depression with emotional eating Rebecca Dominguez is stable on Lexapro. She enjoyed her getaway to see her family for a wedding. She is still mindful of her food choices. She is looking forward to future travel.  Assessment/Plan:   1. COVID-19 vaccine regimen to maintain immunity completed We will continue to monitor, and will record in her chart. Rebecca Dominguez was encouraged to continue to mask in groups of unvaccinated people in the meanwhile. Orders and follow up as documented in patient record.  2. Other depression with emotional eating Behavior modification techniques were discussed today to help Rebecca Dominguez deal with her emotional/non-hunger eating behaviors. Rebecca Dominguez will continue Lexapro at 10 mg for now, and will follow closely. Orders and follow up as documented in patient record.   3. Class 3 severe obesity with serious comorbidity and body mass index (BMI) of 45.0 to 49.9 in adult, unspecified obesity type [facility]) Rebecca Dominguez is currently in the action stage of change. As such, her goal is to  continue with weight loss efforts. She has agreed to the Category 3 Plan.   Behavioral modification strategies: increasing lean protein intake, decreasing simple carbohydrates, increasing water intake and holiday eating strategies .  Rebecca Dominguez has agreed to follow-up with our clinic in 3 to 4 weeks. She was informed of the importance of frequent follow-up visits to maximize her success with intensive lifestyle modifications for her multiple health conditions.   Objective:   Blood pressure 119/78, pulse 75, temperature 98.4 F (36.9 C), height 5\' 5"  (1.651 m), weight 271 lb (122.9 kg), SpO2 98 %. Body mass index is 45.1 kg/m.  General: Cooperative, alert, well developed, in no acute distress. HEENT: Conjunctivae and lids unremarkable. Cardiovascular: Regular rhythm.  Lungs: Normal work of breathing. Neurologic: No focal deficits.   Lab Results  Component Value Date   CREATININE 0.74 12/27/2019   BUN 16 12/27/2019   NA 141 12/27/2019   K 4.2 12/27/2019   CL 103 12/27/2019   CO2 23 12/27/2019   Lab Results  Component Value Date   ALT 28 12/27/2019   AST 24 12/27/2019   ALKPHOS 90 12/27/2019   BILITOT 0.3 12/27/2019   Lab Results  Component Value Date   HGBA1C 5.2 12/27/2019   HGBA1C 5.3 09/12/2018   Lab Results  Component Value Date   INSULIN 7.9 12/27/2019   INSULIN 8.8 09/12/2018   Lab Results  Component Value Date   TSH 4.350 09/12/2018   Lab Results  Component Value Date   CHOL 157 12/27/2019   HDL 66 12/27/2019   LDLCALC 81 12/27/2019  TRIG 46 12/27/2019   Lab Results  Component Value Date   WBC 6.1 12/27/2019   HGB 14.2 12/27/2019   HCT 44.0 12/27/2019   MCV 91 12/27/2019   PLT 231 12/27/2019   Lab Results  Component Value Date   IRON 47 09/12/2018   TIBC 405 09/12/2018   FERRITIN 17 09/12/2018   Attestation Statements:   Reviewed by clinician on day of visit: allergies, medications, problem list, medical history, surgical history, family  history, social history, and previous encounter notes.  Time spent on visit including pre-visit chart review and post-visit care and charting was 32 minutes.    I, Burt Knack, am acting as transcriptionist for Quillian Quince, MD.  I have reviewed the above documentation for accuracy and completeness, and I agree with the above. -  Quillian Quince, MD

## 2020-06-06 LAB — IRON,TIBC AND FERRITIN PANEL
%SAT: 12
Iron: 54
TIBC: 441

## 2020-06-06 LAB — VITAMIN D 25 HYDROXY (VIT D DEFICIENCY, FRACTURES): Vit D, 25-Hydroxy: 63.4

## 2020-06-06 LAB — VITAMIN B12: Vitamin B-12: 406

## 2020-06-06 LAB — TSH: TSH: 2.41 (ref 0.41–5.90)

## 2020-06-10 ENCOUNTER — Other Ambulatory Visit: Payer: Self-pay | Admitting: Family Medicine

## 2020-06-10 DIAGNOSIS — Z1231 Encounter for screening mammogram for malignant neoplasm of breast: Secondary | ICD-10-CM

## 2020-06-11 ENCOUNTER — Encounter (INDEPENDENT_AMBULATORY_CARE_PROVIDER_SITE_OTHER): Payer: Self-pay

## 2020-06-25 ENCOUNTER — Other Ambulatory Visit: Payer: Self-pay

## 2020-06-25 ENCOUNTER — Encounter (INDEPENDENT_AMBULATORY_CARE_PROVIDER_SITE_OTHER): Payer: Self-pay | Admitting: Family Medicine

## 2020-06-25 ENCOUNTER — Ambulatory Visit (INDEPENDENT_AMBULATORY_CARE_PROVIDER_SITE_OTHER): Payer: BC Managed Care – PPO | Admitting: Family Medicine

## 2020-06-25 VITALS — BP 101/59 | HR 74 | Temp 98.4°F | Ht 65.0 in | Wt 267.0 lb

## 2020-06-25 DIAGNOSIS — F3289 Other specified depressive episodes: Secondary | ICD-10-CM

## 2020-06-25 DIAGNOSIS — Z6841 Body Mass Index (BMI) 40.0 and over, adult: Secondary | ICD-10-CM | POA: Diagnosis not present

## 2020-06-26 NOTE — Progress Notes (Signed)
Chief Complaint:   OBESITY Rebecca Dominguez is here to discuss her progress with her obesity treatment plan along with follow-up of her obesity related diagnoses. Rebecca Dominguez is on the Category 3 Plan and states she is following her eating plan approximately 70% of the time. Rebecca Dominguez states she is walking for 30 minutes 2 times per week.  Today's visit was #: 31 Starting weight: 338 lbs Starting date: 09/12/2018 Today's weight: 267 lbs Today's date: 06/25/2020 Total lbs lost to date: 71 Total lbs lost since last in-office visit: 4  Interim History: Rebecca Dominguez has done well with weight loss even over Thanksgiving. She is getting good support at home from her husband and she did well making healthier choices for Thanksgiving. She feels she will be able to manage healthier eating at Christmas.  Subjective:   1. Other depression with emotional eating Rebecca Dominguez has had increased stress with work and change in lunch time, which has made meal planning difficult. She notes increased temptations but she is doing well avoiding emotional eating.  Assessment/Plan:   1. Other depression with emotional eating Behavior modification techniques were discussed today to help Rebecca Dominguez deal with her emotional/non-hunger eating behaviors. Rebecca Dominguez will continue Lexapro and will continue to monitor. Orders and follow up as documented in patient record.   2. Class 3 severe obesity with serious comorbidity and body mass index (BMI) of 40.0 to 44.9 in adult, unspecified obesity type [facility]) Rebecca Dominguez is currently in the action stage of change. As such, her goal is to continue with weight loss efforts. She has agreed to the Category 3 Plan.   Exercise goals: As is.  Behavioral modification strategies: increasing lean protein intake, dealing with family or coworker sabotage and holiday eating strategies .  Rebecca Dominguez has agreed to follow-up with our clinic in 4 weeks. She was informed of the importance of frequent  follow-up visits to maximize her success with intensive lifestyle modifications for her multiple health conditions.   Objective:   Blood pressure (!) 101/59, pulse 74, temperature 98.4 F (36.9 C), height 5\' 5"  (1.651 m), weight 267 lb (121.1 kg), SpO2 98 %. Body mass index is 44.43 kg/m.  General: Cooperative, alert, well developed, in no acute distress. HEENT: Conjunctivae and lids unremarkable. Cardiovascular: Regular rhythm.  Lungs: Normal work of breathing. Neurologic: No focal deficits.   Lab Results  Component Value Date   CREATININE 0.74 12/27/2019   BUN 16 12/27/2019   NA 141 12/27/2019   K 4.2 12/27/2019   CL 103 12/27/2019   CO2 23 12/27/2019   Lab Results  Component Value Date   ALT 28 12/27/2019   AST 24 12/27/2019   ALKPHOS 90 12/27/2019   BILITOT 0.3 12/27/2019   Lab Results  Component Value Date   HGBA1C 5.2 12/27/2019   HGBA1C 5.3 09/12/2018   Lab Results  Component Value Date   INSULIN 7.9 12/27/2019   INSULIN 8.8 09/12/2018   Lab Results  Component Value Date   TSH 2.41 06/06/2020   Lab Results  Component Value Date   CHOL 157 12/27/2019   HDL 66 12/27/2019   LDLCALC 81 12/27/2019   TRIG 46 12/27/2019   Lab Results  Component Value Date   WBC 6.1 12/27/2019   HGB 14.2 12/27/2019   HCT 44.0 12/27/2019   MCV 91 12/27/2019   PLT 231 12/27/2019   Lab Results  Component Value Date   IRON 54 06/06/2020   TIBC 441 06/06/2020   FERRITIN 17 09/12/2018   Attestation  Statements:   Reviewed by clinician on day of visit: allergies, medications, problem list, medical history, surgical history, family history, social history, and previous encounter notes.  Time spent on visit including pre-visit chart review and post-visit care and charting was 32 minutes.    I, Burt Knack, am acting as transcriptionist for Quillian Quince, MD.  I have reviewed the above documentation for accuracy and completeness, and I agree with the above. -  Quillian Quince, MD

## 2020-07-25 ENCOUNTER — Other Ambulatory Visit (INDEPENDENT_AMBULATORY_CARE_PROVIDER_SITE_OTHER): Payer: Self-pay | Admitting: Family Medicine

## 2020-07-25 ENCOUNTER — Ambulatory Visit: Payer: Self-pay

## 2020-07-25 DIAGNOSIS — F3289 Other specified depressive episodes: Secondary | ICD-10-CM

## 2020-07-25 NOTE — Telephone Encounter (Signed)
Dr.Beasley 

## 2020-07-29 ENCOUNTER — Ambulatory Visit (INDEPENDENT_AMBULATORY_CARE_PROVIDER_SITE_OTHER): Payer: BC Managed Care – PPO | Admitting: Family Medicine

## 2020-07-29 ENCOUNTER — Telehealth (INDEPENDENT_AMBULATORY_CARE_PROVIDER_SITE_OTHER): Payer: Self-pay

## 2020-07-29 ENCOUNTER — Other Ambulatory Visit: Payer: Self-pay

## 2020-07-29 ENCOUNTER — Encounter (INDEPENDENT_AMBULATORY_CARE_PROVIDER_SITE_OTHER): Payer: Self-pay | Admitting: Family Medicine

## 2020-07-29 VITALS — BP 117/73 | HR 81 | Temp 98.3°F | Ht 65.0 in

## 2020-07-29 DIAGNOSIS — Z6841 Body Mass Index (BMI) 40.0 and over, adult: Secondary | ICD-10-CM

## 2020-07-29 DIAGNOSIS — F3289 Other specified depressive episodes: Secondary | ICD-10-CM

## 2020-07-29 MED ORDER — ESCITALOPRAM OXALATE 20 MG PO TABS: 20.0000 mg | ORAL_TABLET | Freq: Every day | ORAL | 0 refills | Status: DC

## 2020-07-29 NOTE — Telephone Encounter (Signed)
Last OV with Dr. Beasley 

## 2020-07-29 NOTE — Telephone Encounter (Signed)
Pt called stating that her LEXAPRO was called into [facility] [address]for 30 tablets and she normally gets 90 tablets called in, please give pt a call.

## 2020-07-30 ENCOUNTER — Other Ambulatory Visit (INDEPENDENT_AMBULATORY_CARE_PROVIDER_SITE_OTHER): Payer: Self-pay

## 2020-07-30 ENCOUNTER — Encounter (INDEPENDENT_AMBULATORY_CARE_PROVIDER_SITE_OTHER): Payer: Self-pay

## 2020-07-30 DIAGNOSIS — F3289 Other specified depressive episodes: Secondary | ICD-10-CM

## 2020-07-30 MED ORDER — ESCITALOPRAM OXALATE 20 MG PO TABS: 20.0000 mg | ORAL_TABLET | Freq: Every day | ORAL | 0 refills | Status: DC

## 2020-07-30 NOTE — Progress Notes (Signed)
Chief Complaint:   OBESITY Rebecca Dominguez is here to discuss her progress with her obesity treatment plan along with follow-up of her obesity related diagnoses. Rebecca Dominguez is on the Category 3 Plan and states she is following her eating plan approximately 75% of the time. Rebecca Dominguez states she is doing 0 minutes 0 times per week.  Today's visit was #: 32 Starting weight: 338 lbs Starting date: 09/12/2018 Today's weight: 262 lbs Today's date: 07/29/2020 Total lbs lost to date: 76 Total lbs lost since last in-office visit: 5  Interim History: Rebecca Dominguez did well losing weight even over the holiday. She did do some celebration eating, but she was mindful. She is still on the Category 3 plan.  Subjective:   1. Other depression with emotional eating Rebecca Dominguez notes increased stress at work and she has been working longer hours.  Assessment/Plan:   1. Other depression with emotional eating Behavior modification techniques were discussed today to help Rebecca Dominguez deal with her emotional/non-hunger eating behaviors. Rebecca Dominguez agreed to increase Lexapro to 20 mg q daily with no refills. Orders and follow up as documented in patient record.   - escitalopram (LEXAPRO) 20 MG tablet; Take 1 tablet (20 mg total) by mouth daily.  Dispense: 30 tablet; Refill: 0  2. Class 3 severe obesity with serious comorbidity and body mass index (BMI) of 40.0 to 44.9 in adult, unspecified obesity type Rebecca Dominguez is currently in the action stage of change. As such, her goal is to continue with weight loss efforts. She has agreed to the Category 3 Plan.   Exercise goals: Add strengthening in when possible.  Behavioral modification strategies: increasing lean protein intake, meal planning and cooking strategies and emotional eating strategies.  Rebecca Dominguez has agreed to follow-up with our clinic in 4 weeks. She was informed of the importance of frequent follow-up visits to maximize her success with intensive  lifestyle modifications for her multiple health conditions.   Objective:   Blood pressure 117/73, pulse 81, temperature 98.3 F (36.8 C), height 5\' 5"  (1.651 m), SpO2 98 %. Body mass index is 44.43 kg/m.  General: Cooperative, alert, well developed, in no acute distress. HEENT: Conjunctivae and lids unremarkable. Cardiovascular: Regular rhythm.  Lungs: Normal work of breathing. Neurologic: No focal deficits.   Lab Results  Component Value Date   CREATININE 0.74 12/27/2019   BUN 16 12/27/2019   NA 141 12/27/2019   K 4.2 12/27/2019   CL 103 12/27/2019   CO2 23 12/27/2019   Lab Results  Component Value Date   ALT 28 12/27/2019   AST 24 12/27/2019   ALKPHOS 90 12/27/2019   BILITOT 0.3 12/27/2019   Lab Results  Component Value Date   HGBA1C 5.2 12/27/2019   HGBA1C 5.3 09/12/2018   Lab Results  Component Value Date   INSULIN 7.9 12/27/2019   INSULIN 8.8 09/12/2018   Lab Results  Component Value Date   TSH 2.41 06/06/2020   Lab Results  Component Value Date   CHOL 157 12/27/2019   HDL 66 12/27/2019   LDLCALC 81 12/27/2019   TRIG 46 12/27/2019   Lab Results  Component Value Date   WBC 6.1 12/27/2019   HGB 14.2 12/27/2019   HCT 44.0 12/27/2019   MCV 91 12/27/2019   PLT 231 12/27/2019   Lab Results  Component Value Date   IRON 54 06/06/2020   TIBC 441 06/06/2020   FERRITIN 17 09/12/2018   Attestation Statements:   Reviewed by clinician on day of visit: allergies,  medications, problem list, medical history, surgical history, family history, social history, and previous encounter notes.  Time spent on visit including pre-visit chart review and post-visit care and charting was 30 minutes.    I, Trixie Dredge, am acting as transcriptionist for Dennard Nip, MD.  I have reviewed the above documentation for accuracy and completeness, and I agree with the above. -  Dennard Nip, MD

## 2020-08-06 ENCOUNTER — Ambulatory Visit: Payer: Self-pay

## 2020-09-02 ENCOUNTER — Ambulatory Visit (INDEPENDENT_AMBULATORY_CARE_PROVIDER_SITE_OTHER): Payer: BC Managed Care – PPO | Admitting: Family Medicine

## 2020-09-02 ENCOUNTER — Encounter (INDEPENDENT_AMBULATORY_CARE_PROVIDER_SITE_OTHER): Payer: Self-pay | Admitting: Family Medicine

## 2020-09-02 ENCOUNTER — Other Ambulatory Visit: Payer: Self-pay

## 2020-09-02 VITALS — BP 107/69 | HR 75 | Temp 98.0°F | Ht 65.0 in | Wt 258.0 lb

## 2020-09-02 DIAGNOSIS — E8881 Metabolic syndrome: Secondary | ICD-10-CM | POA: Diagnosis not present

## 2020-09-02 DIAGNOSIS — Z9884 Bariatric surgery status: Secondary | ICD-10-CM

## 2020-09-02 DIAGNOSIS — E559 Vitamin D deficiency, unspecified: Secondary | ICD-10-CM | POA: Diagnosis not present

## 2020-09-02 DIAGNOSIS — F3289 Other specified depressive episodes: Secondary | ICD-10-CM

## 2020-09-02 DIAGNOSIS — Z6841 Body Mass Index (BMI) 40.0 and over, adult: Secondary | ICD-10-CM

## 2020-09-02 DIAGNOSIS — E88819 Insulin resistance, unspecified: Secondary | ICD-10-CM

## 2020-09-02 DIAGNOSIS — Z9189 Other specified personal risk factors, not elsewhere classified: Secondary | ICD-10-CM | POA: Diagnosis not present

## 2020-09-02 DIAGNOSIS — E66813 Obesity, class 3: Secondary | ICD-10-CM

## 2020-09-03 NOTE — Progress Notes (Signed)
Chief Complaint:   OBESITY Rebecca Dominguez is here to discuss her progress with her obesity treatment plan along with follow-up of her obesity related diagnoses. Rebecca Dominguez is on the Category 3 Plan and states she is following her eating plan approximately 50% of the time. Rebecca Dominguez states she is doing 0 minutes 0 times per week.  Today's visit was #: 33 Starting weight: 338 lbs Starting date: 09/12/2018 Today's weight: 258 lbs Today's date: 09/02/2020 Total lbs lost to date: 80 Total lbs lost since last in-office visit: 4  Interim History: Rebecca Dominguez continues to do well with weight loss. She leaves for [place] and [employer]. She anticipates doing a lot of walking. She is making some plans to do weight gain damage control.  Subjective:   1. Vitamin D deficiency Rebecca Dominguez's Vit D was at goal on multivitamins. She denies signs of over-replacement.  2. Insulin resistance Rebecca Dominguez continues to do very well with diet and weight loss. She is not on metformin and her polyphagia is controlled with diet.  3. History of gastric bypass   4. Other depression with emotional eating Rebecca Dominguez has had increased stress with her mother's poor health and recent hospitalization. She is doing well with the increased dose of Lexapro.  5. At risk for malnutrition Rebecca Dominguez is at increased risk for malnutrition due to gastric bypass.  Assessment/Plan:   1. Vitamin D deficiency Low Vitamin D level contributes to fatigue and are associated with obesity, breast, and colon cancer. Rebecca Dominguez will continue her multivitamins and we will recheck labs next month. She will follow-up for routine testing of Vitamin D, at least 2-3 times per year to avoid over-replacement.  2. Insulin resistance Rebecca Dominguez will continue to work on weight loss, diet, exercise, and decreasing simple carbohydrates to help decrease the risk of diabetes. We will recheck labs next month. Rebecca Dominguez agreed to follow-up with [place]  as directed to closely monitor her progress.  3. History of gastric bypass   4. Other depression with emotional eating Behavior modification techniques were discussed today. Rebecca Dominguez will continue Lexapro at 20 mg, and will continue to moderate emotional eating behaviors. Orders and follow up as documented in patient record.   5. At risk for malnutrition Rebecca Dominguez was given approximately 15 minutes of counseling today regarding prevention of malnutrition and ways to meet macronutrient goals.   6. Class 3 severe obesity with serious comorbidity and body mass index (BMI) of 40.0 to 44.9 in adult, unspecified obesity type [facility]) Rebecca Dominguez is currently in the action stage of change. As such, her goal is to continue with weight loss efforts. She has agreed to the Category 3 Plan.   Behavioral modification strategies: increasing lean protein intake, emotional eating strategies and holiday eating strategies .  Rebecca Dominguez has agreed to follow-up with our clinic in 4 weeks. She was informed of the importance of frequent follow-up visits to maximize her success with intensive lifestyle modifications for her multiple health conditions.   Objective:   Blood pressure 107/69, pulse 75, temperature 98 F (36.7 C), height 5\' 5"  (1.651 m), weight 258 lb (117 kg), SpO2 99 %. Body mass index is 42.93 kg/m.  General: Cooperative, alert, well developed, in no acute distress. HEENT: Conjunctivae and lids unremarkable. Cardiovascular: Regular rhythm.  Lungs: Normal work of breathing. Neurologic: No focal deficits.   Lab Results  Component Value Date   CREATININE 0.74 12/27/2019   BUN 16 12/27/2019   NA 141 12/27/2019   K 4.2 12/27/2019   CL 103  12/27/2019   CO2 23 12/27/2019   Lab Results  Component Value Date   ALT 28 12/27/2019   AST 24 12/27/2019   ALKPHOS 90 12/27/2019   BILITOT 0.3 12/27/2019   Lab Results  Component Value Date   HGBA1C 5.2 12/27/2019   HGBA1C 5.3 09/12/2018   Lab  Results  Component Value Date   INSULIN 7.9 12/27/2019   INSULIN 8.8 09/12/2018   Lab Results  Component Value Date   TSH 2.41 06/06/2020   Lab Results  Component Value Date   CHOL 157 12/27/2019   HDL 66 12/27/2019   LDLCALC 81 12/27/2019   TRIG 46 12/27/2019   Lab Results  Component Value Date   WBC 6.1 12/27/2019   HGB 14.2 12/27/2019   HCT 44.0 12/27/2019   MCV 91 12/27/2019   PLT 231 12/27/2019   Lab Results  Component Value Date   IRON 54 06/06/2020   TIBC 441 06/06/2020   FERRITIN 17 09/12/2018   Attestation Statements:   Reviewed by clinician on day of visit: allergies, medications, problem list, medical history, surgical history, family history, social history, and previous encounter notes.   I, Burt Knack, am acting as transcriptionist for Quillian Quince, MD.  I have reviewed the above documentation for accuracy and completeness, and I agree with the above. -  Quillian Quince, MD

## 2020-09-17 ENCOUNTER — Ambulatory Visit: Payer: Self-pay

## 2020-10-07 ENCOUNTER — Encounter (INDEPENDENT_AMBULATORY_CARE_PROVIDER_SITE_OTHER): Payer: Self-pay | Admitting: Family Medicine

## 2020-10-07 ENCOUNTER — Other Ambulatory Visit: Payer: Self-pay

## 2020-10-07 ENCOUNTER — Ambulatory Visit (INDEPENDENT_AMBULATORY_CARE_PROVIDER_SITE_OTHER): Payer: BC Managed Care – PPO | Admitting: Family Medicine

## 2020-10-07 VITALS — BP 114/72 | HR 82 | Temp 98.1°F | Ht 65.0 in | Wt 252.0 lb

## 2020-10-07 DIAGNOSIS — E8881 Metabolic syndrome: Secondary | ICD-10-CM

## 2020-10-07 DIAGNOSIS — E559 Vitamin D deficiency, unspecified: Secondary | ICD-10-CM | POA: Diagnosis not present

## 2020-10-07 DIAGNOSIS — E88819 Insulin resistance, unspecified: Secondary | ICD-10-CM

## 2020-10-07 DIAGNOSIS — Z6841 Body Mass Index (BMI) 40.0 and over, adult: Secondary | ICD-10-CM

## 2020-10-08 NOTE — Progress Notes (Signed)
Chief Complaint:   OBESITY Rebecca Dominguez is here to discuss her progress with her obesity treatment plan along with follow-up of her obesity related diagnoses. Rebecca Dominguez is on the Category 3 Plan and states she is following her eating plan approximately 50-60% of the time. Rebecca Dominguez states she is walking 1 mile 7 times per week.  Today's visit was #: 34 Starting weight: 338 lbs Starting date: 09/12/2018 Today's weight: 252 lbs Today's date: 10/07/2020 Total lbs lost to date: 86 lbs Total lbs lost since last in-office visit: 6 lbs  Interim History: The last few weeks have been very busy. [place] went to [place] and [employer] She did have some splurges on food but did try to follow the plan as much as she could. Her husband is recovering from C5-C6 fusion currently, so for the next few weeks she will be helping him recover and staying home.  Subjective:   1. Vitamin D deficiency Rebecca Dominguez's Vitamin D level was 63.4 on 06/05/2020. She is currently taking no vitamin D supplement. She denies nausea, vomiting or muscle weakness. She does note fatigue.   Ref. Range 06/05/2020 00:00  Vitamin D, 25-Hydroxy Unknown 63.4   2. Insulin resistance Rebecca Dominguez's last insulin level was checked June 2021. She is not on medication. She reports occasional carb cravings.  Lab Results  Component Value Date   INSULIN 7.9 12/27/2019   INSULIN 8.8 09/12/2018   Lab Results  Component Value Date   HGBA1C 5.2 12/27/2019    Assessment/Plan:   1. Vitamin D deficiency Low Vitamin D level contributes to fatigue and are associated with obesity, breast, and colon cancer. She agrees to follow-up for routine testing of Vitamin D, at least 2-3 times per year to avoid over-replacement. Repeat Vit D lab in May 2022.  2. Insulin resistance Rebecca Dominguez will continue to work on weight loss, exercise, and decreasing simple carbohydrates to help decrease the risk of diabetes. Rebecca Dominguez agreed to follow-up with  [place] as directed to closely monitor her progress. Repeat lab in 1 month.  3. Class 3 severe obesity with serious comorbidity and body mass index (BMI) of 40.0 to 44.9 in adult, unspecified obesity type Rebecca Dominguez) Rebecca Dominguez is currently in the action stage of change. As such, her goal is to continue with weight loss efforts. She has agreed to the Category 3 Plan.   Exercise goals: As is- start resistance training 10 minutes 2 times a week, in addition to increase walking.  Behavioral modification strategies: increasing lean protein intake, meal planning and cooking strategies, keeping healthy foods in the home and planning for success.  Rebecca Dominguez has agreed to follow-up with our clinic in 4 weeks. She was informed of the importance of frequent follow-up visits to maximize her success with intensive lifestyle modifications for her multiple health conditions.   Objective:   Blood pressure 114/72, pulse 82, temperature 98.1 F (36.7 C), temperature source Oral, height 5\' 5"  (1.651 m), weight 252 lb (114.3 kg), SpO2 98 %. Body mass index is 41.93 kg/m.  General: Cooperative, alert, well developed, in no acute distress. HEENT: Conjunctivae and lids unremarkable. Cardiovascular: Regular rhythm.  Lungs: Normal work of breathing. Neurologic: No focal deficits.   Lab Results  Component Value Date   CREATININE 0.74 12/27/2019   BUN 16 12/27/2019   NA 141 12/27/2019   K 4.2 12/27/2019   CL 103 12/27/2019   CO2 23 12/27/2019   Lab Results  Component Value Date   ALT 28 12/27/2019   AST 24 12/27/2019  ALKPHOS 90 12/27/2019   BILITOT 0.3 12/27/2019   Lab Results  Component Value Date   HGBA1C 5.2 12/27/2019   HGBA1C 5.3 09/12/2018   Lab Results  Component Value Date   INSULIN 7.9 12/27/2019   INSULIN 8.8 09/12/2018   Lab Results  Component Value Date   TSH 2.41 06/05/2020   Lab Results  Component Value Date   CHOL 157 12/27/2019   HDL 66 12/27/2019   LDLCALC 81 12/27/2019   TRIG  46 12/27/2019   Lab Results  Component Value Date   WBC 6.1 12/27/2019   HGB 14.2 12/27/2019   HCT 44.0 12/27/2019   MCV 91 12/27/2019   PLT 231 12/27/2019   Lab Results  Component Value Date   IRON 54 06/05/2020   TIBC 441 06/05/2020   FERRITIN 17 09/12/2018    Attestation Statements:   Reviewed by clinician on day of visit: allergies, medications, problem list, medical history, surgical history, family history, social history, and previous encounter notes.  Time spent on visit including pre-visit chart review and post-visit care and charting was 15 minutes.   Edmund Hilda, am acting as transcriptionist for Reuben Likes, MD.   I have reviewed the above documentation for accuracy and completeness, and I agree with the above. - Katherina Mires, MD

## 2020-10-09 ENCOUNTER — Ambulatory Visit
Admission: RE | Admit: 2020-10-09 | Discharge: 2020-10-09 | Disposition: A | Discharge status: Home / Self Care | Payer: BC Managed Care – PPO | Source: Ambulatory Visit | Attending: Family Medicine | Admitting: Family Medicine

## 2020-10-09 ENCOUNTER — Other Ambulatory Visit: Payer: Self-pay

## 2020-10-09 DIAGNOSIS — Z1231 Encounter for screening mammogram for malignant neoplasm of breast: Secondary | ICD-10-CM

## 2020-11-11 ENCOUNTER — Ambulatory Visit (INDEPENDENT_AMBULATORY_CARE_PROVIDER_SITE_OTHER): Payer: BC Managed Care – PPO | Admitting: Family Medicine

## 2020-11-19 ENCOUNTER — Other Ambulatory Visit (INDEPENDENT_AMBULATORY_CARE_PROVIDER_SITE_OTHER): Payer: Self-pay | Admitting: Family Medicine

## 2020-11-19 DIAGNOSIS — F3289 Other specified depressive episodes: Secondary | ICD-10-CM

## 2020-11-19 NOTE — Telephone Encounter (Signed)
Dr.Ukleja 

## 2020-11-20 ENCOUNTER — Other Ambulatory Visit: Payer: Self-pay

## 2020-11-20 ENCOUNTER — Ambulatory Visit (INDEPENDENT_AMBULATORY_CARE_PROVIDER_SITE_OTHER): Payer: BC Managed Care – PPO | Admitting: Family Medicine

## 2020-11-20 ENCOUNTER — Encounter (INDEPENDENT_AMBULATORY_CARE_PROVIDER_SITE_OTHER): Payer: Self-pay | Admitting: Family Medicine

## 2020-11-20 VITALS — BP 106/71 | HR 73 | Temp 97.3°F | Ht 65.0 in | Wt 256.0 lb

## 2020-11-20 DIAGNOSIS — Z6841 Body Mass Index (BMI) 40.0 and over, adult: Secondary | ICD-10-CM | POA: Diagnosis not present

## 2020-11-20 DIAGNOSIS — F3289 Other specified depressive episodes: Secondary | ICD-10-CM

## 2020-11-20 MED ORDER — ESCITALOPRAM OXALATE 20 MG PO TABS: 20.0000 mg | ORAL_TABLET | Freq: Every day | ORAL | 0 refills | Status: DC

## 2020-11-21 NOTE — Progress Notes (Signed)
Chief Complaint:   OBESITY Rebecca Dominguez is here to discuss her progress with her obesity treatment plan along with follow-up of her obesity related diagnoses. Rebecca Dominguez is on the Category 3 Plan and states she is following her eating plan approximately 70% of the time. Rebecca Dominguez states she is doing 0 minutes 0 times per week.  Today's visit was #: 35 Starting weight: 338 lbs Starting date: 09/12/2018 Today's weight: 256 lbs Today's date: 11/20/2020 Total lbs lost to date: 82 Total lbs lost since last in-office visit: 4  Interim History: Rebecca Dominguez has a new job which is less stressful. This has also helped with meal planning. She is doing well meeting her protein goals.  Subjective:   1. Other depression with emotional eating Rebecca Dominguez is stable on Lexapro, and her blood pressure is stable. She denies worsening insomnia.  Assessment/Plan:   1. Other depression with emotional eating Behavior modification techniques were discussed today to help Rebecca Dominguez deal with her emotional/non-hunger eating behaviors. We will refill Lexapro for 90 days. Orders and follow up as documented in patient record.   - escitalopram (LEXAPRO) 20 MG tablet; Take 1 tablet (20 mg total) by mouth daily.  Dispense: 90 tablet; Refill: 0  2. Obesity with current BMI of 42.6 Rebecca Dominguez is currently in the action stage of change. As such, her goal is to continue with weight loss efforts. She has agreed to the Category 3 Plan.   Behavioral modification strategies: increasing lean protein intake and emotional eating strategies.  Rebecca Dominguez has agreed to follow-up with our clinic in 4 weeks. She was informed of the importance of frequent follow-up visits to maximize her success with intensive lifestyle modifications for her multiple health conditions.   Objective:   Blood pressure 106/71, pulse 73, temperature (!) 97.3 F (36.3 C), height 5\' 5"  (1.651 m), weight 256 lb (116.1 kg), SpO2 98 %. Body mass index is 42.6  kg/m.  General: Cooperative, alert, well developed, in no acute distress. HEENT: Conjunctivae and lids unremarkable. Cardiovascular: Regular rhythm.  Lungs: Normal work of breathing. Neurologic: No focal deficits.   Lab Results  Component Value Date   CREATININE 0.74 12/27/2019   BUN 16 12/27/2019   NA 141 12/27/2019   K 4.2 12/27/2019   CL 103 12/27/2019   CO2 23 12/27/2019   Lab Results  Component Value Date   ALT 28 12/27/2019   AST 24 12/27/2019   ALKPHOS 90 12/27/2019   BILITOT 0.3 12/27/2019   Lab Results  Component Value Date   HGBA1C 5.2 12/27/2019   HGBA1C 5.3 09/12/2018   Lab Results  Component Value Date   INSULIN 7.9 12/27/2019   INSULIN 8.8 09/12/2018   Lab Results  Component Value Date   TSH 2.41 06/05/2020   Lab Results  Component Value Date   CHOL 157 12/27/2019   HDL 66 12/27/2019   LDLCALC 81 12/27/2019   TRIG 46 12/27/2019   Lab Results  Component Value Date   WBC 6.1 12/27/2019   HGB 14.2 12/27/2019   HCT 44.0 12/27/2019   MCV 91 12/27/2019   PLT 231 12/27/2019   Lab Results  Component Value Date   IRON 54 06/05/2020   TIBC 441 06/05/2020   FERRITIN 17 09/12/2018   Attestation Statements:   Reviewed by clinician on day of visit: allergies, medications, problem list, medical history, surgical history, family history, social history, and previous encounter notes.  Time spent on visit including pre-visit chart review and post-visit care and charting was  30 minutes.    I, Burt Knack, am acting as transcriptionist for Quillian Quince, MD.  I have reviewed the above documentation for accuracy and completeness, and I agree with the above. -  Quillian Quince, MD

## 2020-11-23 ENCOUNTER — Encounter (INDEPENDENT_AMBULATORY_CARE_PROVIDER_SITE_OTHER): Payer: Self-pay | Admitting: Family Medicine

## 2020-11-23 ENCOUNTER — Other Ambulatory Visit (INDEPENDENT_AMBULATORY_CARE_PROVIDER_SITE_OTHER): Payer: Self-pay | Admitting: Family Medicine

## 2020-11-23 DIAGNOSIS — F3289 Other specified depressive episodes: Secondary | ICD-10-CM

## 2020-11-25 NOTE — Telephone Encounter (Signed)
Pt last seen by Dr. Beasley.  

## 2020-12-23 ENCOUNTER — Encounter (INDEPENDENT_AMBULATORY_CARE_PROVIDER_SITE_OTHER): Payer: Self-pay | Admitting: Family Medicine

## 2020-12-23 ENCOUNTER — Other Ambulatory Visit: Payer: Self-pay

## 2020-12-23 ENCOUNTER — Ambulatory Visit (INDEPENDENT_AMBULATORY_CARE_PROVIDER_SITE_OTHER): Payer: BC Managed Care – PPO | Admitting: Family Medicine

## 2020-12-23 VITALS — BP 126/80 | HR 63 | Temp 98.2°F | Ht 65.0 in | Wt 255.0 lb

## 2020-12-23 DIAGNOSIS — G5701 Lesion of sciatic nerve, right lower limb: Secondary | ICD-10-CM | POA: Diagnosis not present

## 2020-12-23 DIAGNOSIS — Z6841 Body Mass Index (BMI) 40.0 and over, adult: Secondary | ICD-10-CM

## 2020-12-31 NOTE — Progress Notes (Signed)
Chief Complaint:   OBESITY Rebecca Dominguez is here to discuss her progress with her obesity treatment plan along with follow-up of her obesity related diagnoses. Rebecca Dominguez is on the Category 3 Plan and states she is following her eating plan approximately 50% of the time. Rebecca Dominguez states she is doing 0 minutes 0 times per week.  Today's visit was #: 36 Starting weight: 338 lbs Starting date: 09/12/2020 Today's weight: 255 lbs Today's date: 12/23/2020 Total lbs lost to date: 83 Total lbs lost since last in-office visit: 1  Interim History: Rebecca Dominguez continues to do well with weight loss. She has had a lot of celebration eating challenges, but she has done well with portion control and trying to maximize. She has not been formally exercising, but she has been active and she is increasing her steps.  Subjective:   1. Piriformis syndrome, right Janiece notes pain in the left leg with lateral numbness since she increased walking last month. She notes electrical like "zings" down her leg at times but no loss of strength.  Assessment/Plan:   1. Piriformis syndrome, right Rebecca Dominguez was educated on the syndrome and it's causes, NSAIDS as needed, and she was shown some piriformis stretches. She will continue with weight loss efforts.  2. Obesity with current BMI 42.5 Rebecca Dominguez is currently in the action stage of change. As such, her goal is to continue with weight loss efforts. She has agreed to the Category 3 Plan.   Exercise goals: walking exercise videos were discussed.  Behavioral modification strategies: increasing lean protein intake.  Rebecca Dominguez has agreed to follow-up with our clinic in 4 weeks. She was informed of the importance of frequent follow-up visits to maximize her success with intensive lifestyle modifications for her multiple health conditions.   Objective:   Blood pressure 126/80, pulse 63, temperature 98.2 F (36.8 C), height 5\' 5"  (1.651 m), weight 255 lb (115.7 kg),  SpO2 98 %. Body mass index is 42.43 kg/m.  General: Cooperative, alert, well developed, in no acute distress. HEENT: Conjunctivae and lids unremarkable. Cardiovascular: Regular rhythm.  Lungs: Normal work of breathing. Neurologic: No focal deficits.   Lab Results  Component Value Date   CREATININE 0.74 12/27/2019   BUN 16 12/27/2019   NA 141 12/27/2019   K 4.2 12/27/2019   CL 103 12/27/2019   CO2 23 12/27/2019   Lab Results  Component Value Date   ALT 28 12/27/2019   AST 24 12/27/2019   ALKPHOS 90 12/27/2019   BILITOT 0.3 12/27/2019   Lab Results  Component Value Date   HGBA1C 5.2 12/27/2019   HGBA1C 5.3 09/12/2018   Lab Results  Component Value Date   INSULIN 7.9 12/27/2019   INSULIN 8.8 09/12/2018   Lab Results  Component Value Date   TSH 2.41 06/05/2020   Lab Results  Component Value Date   CHOL 157 12/27/2019   HDL 66 12/27/2019   LDLCALC 81 12/27/2019   TRIG 46 12/27/2019   Lab Results  Component Value Date   WBC 6.1 12/27/2019   HGB 14.2 12/27/2019   HCT 44.0 12/27/2019   MCV 91 12/27/2019   PLT 231 12/27/2019   Lab Results  Component Value Date   IRON 54 06/05/2020   TIBC 441 06/05/2020   FERRITIN 17 09/12/2018   Attestation Statements:   Reviewed by clinician on day of visit: allergies, medications, problem list, medical history, surgical history, family history, social history, and previous encounter notes.  Time spent on visit including pre-visit  chart review and post-visit care and charting was 32 minutes.    I, Burt Knack, am acting as transcriptionist for Quillian Quince, MD.  I have reviewed the above documentation for accuracy and completeness, and I agree with the above. -  Quillian Quince, MD

## 2021-01-21 ENCOUNTER — Ambulatory Visit (INDEPENDENT_AMBULATORY_CARE_PROVIDER_SITE_OTHER): Payer: BC Managed Care – PPO | Admitting: Physician Assistant

## 2021-01-28 ENCOUNTER — Other Ambulatory Visit: Payer: Self-pay

## 2021-01-28 ENCOUNTER — Ambulatory Visit (INDEPENDENT_AMBULATORY_CARE_PROVIDER_SITE_OTHER): Payer: BC Managed Care – PPO | Admitting: Physician Assistant

## 2021-01-28 ENCOUNTER — Encounter (INDEPENDENT_AMBULATORY_CARE_PROVIDER_SITE_OTHER): Payer: Self-pay | Admitting: Physician Assistant

## 2021-01-28 VITALS — BP 98/66 | HR 66 | Temp 98.3°F | Ht 65.0 in | Wt 247.0 lb

## 2021-01-28 DIAGNOSIS — Z6841 Body Mass Index (BMI) 40.0 and over, adult: Secondary | ICD-10-CM

## 2021-01-28 DIAGNOSIS — E8881 Metabolic syndrome: Secondary | ICD-10-CM | POA: Diagnosis not present

## 2021-01-28 DIAGNOSIS — Z9189 Other specified personal risk factors, not elsewhere classified: Secondary | ICD-10-CM | POA: Diagnosis not present

## 2021-01-28 DIAGNOSIS — R0602 Shortness of breath: Secondary | ICD-10-CM

## 2021-01-28 DIAGNOSIS — E559 Vitamin D deficiency, unspecified: Secondary | ICD-10-CM

## 2021-01-28 DIAGNOSIS — E538 Deficiency of other specified B group vitamins: Secondary | ICD-10-CM

## 2021-01-28 DIAGNOSIS — F3289 Other specified depressive episodes: Secondary | ICD-10-CM

## 2021-01-28 MED ORDER — ESCITALOPRAM OXALATE 20 MG PO TABS: 20.0000 mg | ORAL_TABLET | Freq: Every day | ORAL | 0 refills | Status: DC

## 2021-01-29 LAB — LIPID PANEL
Chol/HDL Ratio: 2.3 ratio (ref 0.0–4.4)
Cholesterol, Total: 154 mg/dL (ref 100–199)
HDL: 66 mg/dL (ref >39)
LDL Chol Calc (NIH): 80 mg/dL (ref 0–99)
Triglycerides: 32 mg/dL (ref 0–149)
VLDL Cholesterol Cal: 8 mg/dL (ref 5–40)

## 2021-01-29 LAB — COMPREHENSIVE METABOLIC PANEL
ALT: 32 IU/L (ref 0–32)
AST: 31 IU/L (ref 0–40)
Albumin/Globulin Ratio: 2 (ref 1.2–2.2)
Albumin: 4.5 g/dL (ref 3.8–4.8)
Alkaline Phosphatase: 80 IU/L (ref 44–121)
BUN/Creatinine Ratio: 31 — ABNORMAL HIGH (ref 9–23)
BUN: 19 mg/dL (ref 6–24)
Bilirubin Total: 0.4 mg/dL (ref 0.0–1.2)
CO2: 23 mmol/L (ref 20–29)
Calcium: 9.8 mg/dL (ref 8.7–10.2)
Chloride: 110 mmol/L — ABNORMAL HIGH (ref 96–106)
Creatinine, Ser: 0.61 mg/dL (ref 0.57–1.00)
Globulin, Total: 2.2 g/dL (ref 1.5–4.5)
Glucose: 82 mg/dL (ref 65–99)
Potassium: 4.8 mmol/L (ref 3.5–5.2)
Sodium: 142 mmol/L (ref 134–144)
Total Protein: 6.7 g/dL (ref 6.0–8.5)
eGFR: 113 mL/min/{1.73_m2} (ref >59)

## 2021-01-29 LAB — VITAMIN B12: Vitamin B-12: 329 pg/mL (ref 232–1245)

## 2021-01-29 LAB — HEMOGLOBIN A1C
Est. average glucose Bld gHb Est-mCnc: 103 mg/dL
Hgb A1c MFr Bld: 5.2 % (ref 4.8–5.6)

## 2021-01-29 LAB — INSULIN, RANDOM: INSULIN: 7.8 u[IU]/mL (ref 2.6–24.9)

## 2021-01-29 LAB — VITAMIN D 25 HYDROXY (VIT D DEFICIENCY, FRACTURES): Vit D, 25-Hydroxy: 50.5 ng/mL (ref 30.0–100.0)

## 2021-02-04 NOTE — Progress Notes (Signed)
Chief Complaint:   OBESITY Rebecca Dominguez is here to discuss her progress with her obesity treatment plan along with follow-up of her obesity related diagnoses. Rebecca Dominguez is on the Category 3 Plan and states she is following her eating plan approximately 70% of the time. Rebecca Dominguez states she is walking for 20 minutes 2-3 times per week.  Today's visit was #: 37 Starting weight: 338 lbs Starting date: 09/12/2020 Today's weight: 247 lbs Today's date: 01/28/2021 Total lbs lost to date: 91 Total lbs lost since last in-office visit: 8  Interim History: Rebecca Dominguez has had multiple family celebrations recently and she has done a good job of being mindful of her choices. She has been portion controlling well, and her hunger is well controlled.  Subjective:   1. Vitamin D deficiency Rebecca Dominguez's last vit D level was at goal. She is on multivitamins. Last Vit D level with her primary care physician in 05/2020 was 63.4.  2. Insulin resistance Rebecca Dominguez is not on medications, and she denies polyphagia.  3. Vitamin B 12 deficiency Rebecca Dominguez is not currently on B12. She has a history of gastric bypass.  4. Shortness of breath Rebecca Dominguez notes shortness of breath with exertion. She denies lightheadedness.  5. Other depression with emotional eating Rebecca Dominguez feels that Lexapro greatly decreases her anxiety.  6. At risk for osteoporosis Rebecca Dominguez is at higher risk of osteopenia and osteoporosis due to Vitamin D deficiency.   Assessment/Plan:   1. Vitamin D deficiency Low Vitamin D level contributes to fatigue and are associated with obesity, breast, and colon cancer. We will check labs today. Rebecca Dominguez will continue her multivitamins and will follow-up for routine testing of Vitamin D, at least 2-3 times per year to avoid over-replacement.  - VITAMIN D 25 Hydroxy (Vit-D Deficiency, Fractures)  2. Insulin resistance Kimley will continue to work on weight loss, exercise, and decreasing  simple carbohydrates to help decrease the risk of diabetes. We will check labs today. Rebecca Dominguez agreed to follow-up with Rebecca Dominguez as directed to closely monitor her progress.  - Comprehensive metabolic panel - Hemoglobin A1c - Insulin, random  3. Vitamin B 12 deficiency The diagnosis was reviewed with the patient. Counseling provided today, see below. We will check labs today, and will continue to monitor. Orders and follow up as documented in patient record.  Counseling The body needs vitamin B12: to make red blood cells; to make DNA; and to help the nerves work properly so they can carry messages from the brain to the body.  The main causes of vitamin B12 deficiency include dietary deficiency, digestive diseases, pernicious anemia, and having a surgery in which part of the stomach or small intestine is removed.  Certain medicines can make it harder for the body to absorb vitamin B12. These medicines include: heartburn medications; some antibiotics; some medications used to treat diabetes, gout, and high cholesterol.  In some cases, there are no symptoms of this condition. If the condition leads to anemia or nerve damage, various symptoms can occur, such as weakness or fatigue, shortness of breath, and numbness or tingling in your hands and feet.   Treatment:  May include taking vitamin B12 supplements.  Avoid alcohol.  Eat lots of healthy foods that contain vitamin B12: Beef, pork, chicken, [place], and organ meats, such as liver.  Seafood: This includes clams, rainbow trout, salmon, tuna, and haddock. Eggs.  Cereal and dairy products that are fortified: This means that vitamin B12 has been added to the food.   - Vitamin  B12  4. Shortness of breath Rebecca Dominguez agreed to work on weight loss and gradually increase exercise to treat her exercise induced shortness of breath. We will check labs today, and will continue to monitor closely.  - Comprehensive metabolic panel - Lipid panel  5. Other  depression with emotional eating Behavior modification techniques were discussed today to help Rebecca Dominguez deal with her emotional/non-hunger eating behaviors. We will refill Lexapro for 90 days with no refills. Orders and follow up as documented in patient record.   - escitalopram (LEXAPRO) 20 MG tablet; Take 1 tablet (20 mg total) by mouth daily.  Dispense: 90 tablet; Refill: 0  6. At risk for osteoporosis Rebecca Dominguez was given approximately 15 minutes of osteoporosis prevention counseling today. Rebecca Dominguez is at risk for osteopenia and osteoporosis due to her Vitamin D deficiency. She was encouraged to take her Vitamin D and follow her higher calcium diet and increase strengthening exercise to help strengthen her bones and decrease her risk of osteopenia and osteoporosis.  Repetitive spaced learning was employed today to elicit superior memory formation and behavioral change.  7. Obesity with current BMI 42.5 Cherrie is currently in the action stage of change. As such, her goal is to continue with weight loss efforts. She has agreed to the Category 3 Plan.   Exercise goals: As is.  Behavioral modification strategies: meal planning and cooking strategies and keeping healthy foods in the home.  Rebecca Dominguez has agreed to follow-up with our clinic in 2 weeks. She was informed of the importance of frequent follow-up visits to maximize her success with intensive lifestyle modifications for her multiple health conditions.   Rebecca Dominguez was informed we would discuss her lab results at her next visit unless there is a critical issue that needs to be addressed sooner. Rebecca Dominguez agreed to keep her next visit at the agreed upon time to discuss these results.  Objective:   Blood pressure 98/66, pulse 66, temperature 98.3 F (36.8 C), height 5\' 5"  (1.651 m), weight 247 lb (112 kg), SpO2 99 %. Body mass index is 41.1 kg/m.  General: Cooperative, alert, well developed, in no acute distress. HEENT:  Conjunctivae and lids unremarkable. Cardiovascular: Regular rhythm.  Lungs: Normal work of breathing. Neurologic: No focal deficits.   Lab Results  Component Value Date   CREATININE 0.61 01/28/2021   BUN 19 01/28/2021   NA 142 01/28/2021   K 4.8 01/28/2021   CL 110 (H) 01/28/2021   CO2 23 01/28/2021   Lab Results  Component Value Date   ALT 32 01/28/2021   AST 31 01/28/2021   ALKPHOS 80 01/28/2021   BILITOT 0.4 01/28/2021   Lab Results  Component Value Date   HGBA1C 5.2 01/28/2021   HGBA1C 5.2 12/27/2019   HGBA1C 5.3 09/12/2018   Lab Results  Component Value Date   INSULIN 7.8 01/28/2021   INSULIN 7.9 12/27/2019   INSULIN 8.8 09/12/2018   Lab Results  Component Value Date   TSH 2.41 06/05/2020   Lab Results  Component Value Date   CHOL 154 01/28/2021   HDL 66 01/28/2021   LDLCALC 80 01/28/2021   TRIG 32 01/28/2021   CHOLHDL 2.3 01/28/2021   Lab Results  Component Value Date   VD25OH 50.5 01/28/2021   VD25OH 63.4 06/05/2020   VD25OH 73.3 12/27/2019   Lab Results  Component Value Date   WBC 6.1 12/27/2019   HGB 14.2 12/27/2019   HCT 44.0 12/27/2019   MCV 91 12/27/2019   PLT 231 12/27/2019  Lab Results  Component Value Date   IRON 54 06/05/2020   TIBC 441 06/05/2020   FERRITIN 17 09/12/2018   Attestation Statements:   Reviewed by clinician on day of visit: allergies, medications, problem list, medical history, surgical history, family history, social history, and previous encounter notes.   Trude Mcburney, am acting as transcriptionist for [employer], PA-C.  I have reviewed the above documentation for accuracy and completeness, and I agree with the above. Alois Cliche, PA-C

## 2021-03-06 ENCOUNTER — Other Ambulatory Visit: Payer: Self-pay

## 2021-03-06 ENCOUNTER — Ambulatory Visit (INDEPENDENT_AMBULATORY_CARE_PROVIDER_SITE_OTHER): Payer: BC Managed Care – PPO | Admitting: Family Medicine

## 2021-03-06 ENCOUNTER — Encounter (INDEPENDENT_AMBULATORY_CARE_PROVIDER_SITE_OTHER): Payer: Self-pay | Admitting: Family Medicine

## 2021-03-06 VITALS — BP 113/72 | HR 64 | Temp 98.3°F | Ht 65.0 in | Wt 249.0 lb

## 2021-03-06 DIAGNOSIS — F439 Reaction to severe stress, unspecified: Secondary | ICD-10-CM | POA: Diagnosis not present

## 2021-03-06 DIAGNOSIS — Z6841 Body Mass Index (BMI) 40.0 and over, adult: Secondary | ICD-10-CM

## 2021-03-10 NOTE — Progress Notes (Signed)
Chief Complaint:   OBESITY Rebecca Dominguez is here to discuss her progress with her obesity treatment plan along with follow-up of her obesity related diagnoses. Rebecca Dominguez is on the Category 3 Plan and states she is following her eating plan approximately 75% of the time. Rebecca Dominguez states she is doing yard work and walking.   Today's visit was #: 38 Starting weight: 338 lbs Starting date: 09/12/2020 Today's weight: 249 lbs Today's date: 03/06/2021 Total lbs lost to date: 89 Total lbs lost since last in-office visit: 0  Interim History: Rebecca Dominguez did some celebration eating over the last month. She tried to be mindful and make better choices when she soul. She has had multiple family members positive for COVID, but she didn't get it herself but this has made meal planning difficult.  Subjective:   1. Stress Rebecca Dominguez has been dealing with a lot of friends and family's health issues, and this has made meal planning difficult and she hasn't been able to concentrate on her nutrition.  Assessment/Plan:   1. Stress Rebecca Dominguez was offered support and encouragement, and I reassured her this is temporary.  2. Obesity with current BMI 41.5 Rebecca Dominguez is currently in the action stage of change. As such, her goal is to continue with weight loss efforts. She has agreed to the Category 3 Plan.   Exercise goals: As is.  Behavioral modification strategies: increasing lean protein intake, meal planning and cooking strategies, and emotional eating strategies.  Rebecca Dominguez has agreed to follow-up with our clinic in 4 weeks. She was informed of the importance of frequent follow-up visits to maximize her success with intensive lifestyle modifications for her multiple health conditions.   Objective:   Blood pressure 113/72, pulse 64, temperature 98.3 F (36.8 C), height 5\' 5"  (1.651 m), weight 249 lb (112.9 kg), SpO2 98 %. Body mass index is 41.44 kg/m.  General: Cooperative, alert, well developed, in  no acute distress. HEENT: Conjunctivae and lids unremarkable. Cardiovascular: Regular rhythm.  Lungs: Normal work of breathing. Neurologic: No focal deficits.   Lab Results  Component Value Date   CREATININE 0.61 01/28/2021   BUN 19 01/28/2021   NA 142 01/28/2021   K 4.8 01/28/2021   CL 110 (H) 01/28/2021   CO2 23 01/28/2021   Lab Results  Component Value Date   ALT 32 01/28/2021   AST 31 01/28/2021   ALKPHOS 80 01/28/2021   BILITOT 0.4 01/28/2021   Lab Results  Component Value Date   HGBA1C 5.2 01/28/2021   HGBA1C 5.2 12/27/2019   HGBA1C 5.3 09/12/2018   Lab Results  Component Value Date   INSULIN 7.8 01/28/2021   INSULIN 7.9 12/27/2019   INSULIN 8.8 09/12/2018   Lab Results  Component Value Date   TSH 2.41 06/05/2020   Lab Results  Component Value Date   CHOL 154 01/28/2021   HDL 66 01/28/2021   LDLCALC 80 01/28/2021   TRIG 32 01/28/2021   CHOLHDL 2.3 01/28/2021   Lab Results  Component Value Date   VD25OH 50.5 01/28/2021   VD25OH 63.4 06/05/2020   VD25OH 73.3 12/27/2019   Lab Results  Component Value Date   WBC 6.1 12/27/2019   HGB 14.2 12/27/2019   HCT 44.0 12/27/2019   MCV 91 12/27/2019   PLT 231 12/27/2019   Lab Results  Component Value Date   IRON 54 06/05/2020   TIBC 441 06/05/2020   FERRITIN 17 09/12/2018   Attestation Statements:   Reviewed by clinician on day of visit:  allergies, medications, problem list, medical history, surgical history, family history, social history, and previous encounter notes.  Time spent on visit including pre-visit chart review and post-visit care and charting was 35 minutes.    I, Burt Knack, am acting as transcriptionist for Quillian Quince, MD.  I have reviewed the above documentation for accuracy and completeness, and I agree with the above. -  Quillian Quince, MD

## 2021-04-03 ENCOUNTER — Encounter (INDEPENDENT_AMBULATORY_CARE_PROVIDER_SITE_OTHER): Payer: Self-pay | Admitting: Family Medicine

## 2021-04-03 ENCOUNTER — Ambulatory Visit (INDEPENDENT_AMBULATORY_CARE_PROVIDER_SITE_OTHER): Payer: BC Managed Care – PPO | Admitting: Family Medicine

## 2021-04-03 ENCOUNTER — Other Ambulatory Visit: Payer: Self-pay

## 2021-04-03 VITALS — BP 108/67 | HR 72 | Temp 98.4°F | Ht 65.0 in | Wt 251.0 lb

## 2021-04-03 DIAGNOSIS — Z6841 Body Mass Index (BMI) 40.0 and over, adult: Secondary | ICD-10-CM | POA: Diagnosis not present

## 2021-04-03 DIAGNOSIS — F439 Reaction to severe stress, unspecified: Secondary | ICD-10-CM

## 2021-04-03 NOTE — Progress Notes (Signed)
Chief Complaint:   OBESITY Rebecca Dominguez is here to discuss her progress with her obesity treatment plan along with follow-up of her obesity related diagnoses. Rebecca Dominguez is on the Category 3 Plan and states she is following her eating plan approximately 50% of the time. Rebecca Dominguez states she is doing 0 minutes 0 times per week.  Today's visit was #: 39 Starting weight: 338 lbs Starting date: 09/12/2020 Today's weight: 251 lbs Today's date: 04/03/2021 Total lbs lost to date: 87 Total lbs lost since last in-office visit: 0  Interim History: Rebecca Dominguez has had a lot of stressors recently, and she hasn't been able to concentrate on diet and meal planning. It appears most of her 2 lb weight gain is due to water weight.  Subjective:   1. Stress Rebecca Dominguez is having stressors including her husband's health. She is not sleeping well. Her frustration level is very high. She hasn't been able to dedicate as much time to herself.  Assessment/Plan:   1. Stress Rebecca Dominguez is to continue Lexapro, and she was offered support and encouragement to do more self-care. Will continue to monitor.  2. Obesity with current BMI 41.8 Rebecca Dominguez is currently in the action stage of change. As such, her goal is to continue with weight loss efforts. She has agreed to the Category 3 Plan.   Behavioral modification strategies: meal planning and cooking strategies and emotional eating strategies.  Rebecca Dominguez has agreed to follow-up with our clinic in 4 weeks. She was informed of the importance of frequent follow-up visits to maximize her success with intensive lifestyle modifications for her multiple health conditions.   Objective:   Blood pressure 108/67, pulse 72, temperature 98.4 F (36.9 C), temperature source Oral, height 5\' 5"  (1.651 m), weight 251 lb (113.9 kg), SpO2 99 %. Body mass index is 41.77 kg/m.  General: Cooperative, alert, well developed, in no acute distress. HEENT: Conjunctivae and lids  unremarkable. Cardiovascular: Regular rhythm.  Lungs: Normal work of breathing. Neurologic: No focal deficits.   Lab Results  Component Value Date   CREATININE 0.61 01/28/2021   BUN 19 01/28/2021   NA 142 01/28/2021   K 4.8 01/28/2021   CL 110 (H) 01/28/2021   CO2 23 01/28/2021   Lab Results  Component Value Date   ALT 32 01/28/2021   AST 31 01/28/2021   ALKPHOS 80 01/28/2021   BILITOT 0.4 01/28/2021   Lab Results  Component Value Date   HGBA1C 5.2 01/28/2021   HGBA1C 5.2 12/27/2019   HGBA1C 5.3 09/12/2018   Lab Results  Component Value Date   INSULIN 7.8 01/28/2021   INSULIN 7.9 12/27/2019   INSULIN 8.8 09/12/2018   Lab Results  Component Value Date   TSH 2.41 06/05/2020   Lab Results  Component Value Date   CHOL 154 01/28/2021   HDL 66 01/28/2021   LDLCALC 80 01/28/2021   TRIG 32 01/28/2021   CHOLHDL 2.3 01/28/2021   Lab Results  Component Value Date   VD25OH 50.5 01/28/2021   VD25OH 63.4 06/05/2020   VD25OH 73.3 12/27/2019   Lab Results  Component Value Date   WBC 6.1 12/27/2019   HGB 14.2 12/27/2019   HCT 44.0 12/27/2019   MCV 91 12/27/2019   PLT 231 12/27/2019   Lab Results  Component Value Date   IRON 54 06/05/2020   TIBC 441 06/05/2020   FERRITIN 17 09/12/2018   Attestation Statements:   Reviewed by clinician on day of visit: allergies, medications, problem list, medical history, surgical  history, family history, social history, and previous encounter notes.  Time spent on visit including pre-visit chart review and post-visit care and charting was 44 minutes.    I, Burt Knack, am acting as transcriptionist for Quillian Quince, MD.  I have reviewed the above documentation for accuracy and completeness, and I agree with the above. -  Quillian Quince, MD

## 2021-04-30 ENCOUNTER — Other Ambulatory Visit (INDEPENDENT_AMBULATORY_CARE_PROVIDER_SITE_OTHER): Payer: Self-pay | Admitting: Physician Assistant

## 2021-04-30 DIAGNOSIS — F3289 Other specified depressive episodes: Secondary | ICD-10-CM

## 2021-04-30 NOTE — Telephone Encounter (Signed)
Last OV with Dr. Beasley 

## 2021-04-30 NOTE — Telephone Encounter (Signed)
LAST APPOINTMENT DATE: 04/03/21 NEXT APPOINTMENT DATE: 05/01/21   [facility] #[id number] - [place], [place] - [address] AT [facility] [address] [place] [address] Phone: [phone] Fax: [phone]  Patient is requesting a refill of the following medications: Requested Prescriptions   Pending Prescriptions Disp Refills   escitalopram (LEXAPRO) 20 MG tablet [Pharmacy Med Name: ESCITALOPRAM 20MG  TABLETS] 90 tablet 0    Sig: TAKE 1 TABLET(20 MG) BY MOUTH DAILY    Date last filled: 01/28/21 Previously prescribed by 03/31/21, PA-C  Lab Results  Component Value Date   HGBA1C 5.2 01/28/2021   HGBA1C 5.2 12/27/2019   HGBA1C 5.3 09/12/2018   Lab Results  Component Value Date   LDLCALC 80 01/28/2021   CREATININE 0.61 01/28/2021   Lab Results  Component Value Date   VD25OH 50.5 01/28/2021   VD25OH 63.4 06/05/2020   VD25OH 73.3 12/27/2019    BP Readings from Last 3 Encounters:  04/03/21 108/67  03/06/21 113/72  01/28/21 98/66

## 2021-05-01 ENCOUNTER — Other Ambulatory Visit: Payer: Self-pay

## 2021-05-01 ENCOUNTER — Encounter (INDEPENDENT_AMBULATORY_CARE_PROVIDER_SITE_OTHER): Payer: Self-pay | Admitting: Family Medicine

## 2021-05-01 ENCOUNTER — Ambulatory Visit (INDEPENDENT_AMBULATORY_CARE_PROVIDER_SITE_OTHER): Payer: BC Managed Care – PPO | Admitting: Family Medicine

## 2021-05-01 VITALS — BP 123/76 | HR 76 | Temp 98.4°F | Ht 65.0 in | Wt 254.0 lb

## 2021-05-01 DIAGNOSIS — F3289 Other specified depressive episodes: Secondary | ICD-10-CM | POA: Diagnosis not present

## 2021-05-01 DIAGNOSIS — Z6841 Body Mass Index (BMI) 40.0 and over, adult: Secondary | ICD-10-CM

## 2021-05-01 DIAGNOSIS — E559 Vitamin D deficiency, unspecified: Secondary | ICD-10-CM

## 2021-05-01 MED ORDER — ESCITALOPRAM OXALATE 10 MG PO TABS: 10.0000 mg | ORAL_TABLET | Freq: Every day | ORAL | 0 refills | Status: DC

## 2021-05-01 NOTE — Progress Notes (Signed)
Chief Complaint:   OBESITY Kathie is here to discuss her progress with her obesity treatment plan along with follow-up of her obesity related diagnoses. Deborrah is on the Category 3 Plan and states she is following her eating plan approximately 50% of the time. Yomayra states she is doing 0 minutes 0 times per week.  Today's visit was #: 40 Starting weight: 338 lbs Starting date: 09/12/2020 Today's weight: 254 lbs Today's date: 05/01/2021 Total lbs lost to date: 84 Total lbs lost since last in-office visit: 0  Interim History: Jessilynn hasn't been able to concentrate on weight loss, as her depression has worsened and she is grieving the loss of her dog.  Subjective:   1. Vitamin D deficiency Mashonda is on multivitamins and her Vit D level is at goal.  2. Other depression Almyra has a lot of stress and grieving lately. She is tearful in the office today. She denies suicidal ideations or homicidal ideations.  Assessment/Plan:   1. Vitamin D deficiency Low Vitamin D level contributes to fatigue and are associated with obesity, breast, and colon cancer. Billiejo will continue her multivitamins, and we will recheck labs in 2 months. She will follow-up for routine testing of Vitamin D, at least 2-3 times per year to avoid over-replacement.  2. Other depression Behavior modification techniques were discussed today to help Janett deal with her emotional/non-hunger eating behaviors. Somara agreed to increase Lexapro to 20 mg q daily #30 and 10 mg q daily #30 (2 prescriptions were given). Orders and follow up as documented in patient record.   3. Obesity with current BMI 42.3 Jahmiya is currently in the action stage of change. As such, her goal is to continue with weight loss efforts. She has agreed to the Category 3 Plan.   Lelani is to concentrate on not skipping meals for now, and we will slowly work on meal planning as her depression improves.  Behavioral  modification strategies: increasing lean protein intake.  Katelynne has agreed to follow-up with our clinic in 3 to 4 weeks. She was informed of the importance of frequent follow-up visits to maximize her success with intensive lifestyle modifications for her multiple health conditions.   Objective:   Blood pressure 123/76, pulse 76, temperature 98.4 F (36.9 C), height 5\' 5"  (1.651 m), weight 254 lb (115.2 kg), SpO2 99 %. Body mass index is 42.27 kg/m.  General: Cooperative, alert, well developed, in no acute distress. HEENT: Conjunctivae and lids unremarkable. Cardiovascular: Regular rhythm.  Lungs: Normal work of breathing. Neurologic: No focal deficits.   Lab Results  Component Value Date   CREATININE 0.61 01/28/2021   BUN 19 01/28/2021   NA 142 01/28/2021   K 4.8 01/28/2021   CL 110 (H) 01/28/2021   CO2 23 01/28/2021   Lab Results  Component Value Date   ALT 32 01/28/2021   AST 31 01/28/2021   ALKPHOS 80 01/28/2021   BILITOT 0.4 01/28/2021   Lab Results  Component Value Date   HGBA1C 5.2 01/28/2021   HGBA1C 5.2 12/27/2019   HGBA1C 5.3 09/12/2018   Lab Results  Component Value Date   INSULIN 7.8 01/28/2021   INSULIN 7.9 12/27/2019   INSULIN 8.8 09/12/2018   Lab Results  Component Value Date   TSH 2.41 06/05/2020   Lab Results  Component Value Date   CHOL 154 01/28/2021   HDL 66 01/28/2021   LDLCALC 80 01/28/2021   TRIG 32 01/28/2021   CHOLHDL 2.3 01/28/2021   Lab  Results  Component Value Date   VD25OH 50.5 01/28/2021   VD25OH 63.4 06/05/2020   VD25OH 73.3 12/27/2019   Lab Results  Component Value Date   WBC 6.1 12/27/2019   HGB 14.2 12/27/2019   HCT 44.0 12/27/2019   MCV 91 12/27/2019   PLT 231 12/27/2019   Lab Results  Component Value Date   IRON 54 06/05/2020   TIBC 441 06/05/2020   FERRITIN 17 09/12/2018   Attestation Statements:   Reviewed by clinician on day of visit: allergies, medications, problem list, medical history,  surgical history, family history, social history, and previous encounter notes.  Time spent on visit including pre-visit chart review and post-visit care and charting was 40 minutes.    I, Burt Knack, am acting as transcriptionist for Quillian Quince, MD.  I have reviewed the above documentation for accuracy and completeness, and I agree with the above. -  Quillian Quince, MD

## 2021-05-28 ENCOUNTER — Other Ambulatory Visit (INDEPENDENT_AMBULATORY_CARE_PROVIDER_SITE_OTHER): Payer: Self-pay | Admitting: Family Medicine

## 2021-05-28 DIAGNOSIS — F3289 Other specified depressive episodes: Secondary | ICD-10-CM

## 2021-05-28 NOTE — Telephone Encounter (Signed)
Pt last seen by Dr. Beasley.  

## 2021-05-28 NOTE — Telephone Encounter (Signed)
LAST APPOINTMENT DATE: 05/01/21 NEXT APPOINTMENT DATE: 06/03/21   [facility] DR[facility] STORE #[id number] - [place], [place] - [address] AT [facility] [address] [place] [address] Phone: [phone] Fax: [phone]  Patient is requesting a refill of the following medications: Requested Prescriptions   Pending Prescriptions Disp Refills   escitalopram (LEXAPRO) 10 MG tablet [Pharmacy Med Name: ESCITALOPRAM 10MG  TABLETS] 30 tablet 0    Sig: TAKE 1 TABLET(10 MG) BY MOUTH DAILY    Date last filled: 05/01/21 Previously prescribed by Dr. 05/03/21  Lab Results  Component Value Date   HGBA1C 5.2 01/28/2021   HGBA1C 5.2 12/27/2019   HGBA1C 5.3 09/12/2018   Lab Results  Component Value Date   LDLCALC 80 01/28/2021   CREATININE 0.61 01/28/2021   Lab Results  Component Value Date   VD25OH 50.5 01/28/2021   VD25OH 63.4 06/05/2020   VD25OH 73.3 12/27/2019    BP Readings from Last 3 Encounters:  05/01/21 123/76  04/03/21 108/67  03/06/21 113/72

## 2021-06-03 ENCOUNTER — Encounter (INDEPENDENT_AMBULATORY_CARE_PROVIDER_SITE_OTHER): Payer: Self-pay | Admitting: Family Medicine

## 2021-06-03 ENCOUNTER — Other Ambulatory Visit: Payer: Self-pay

## 2021-06-03 ENCOUNTER — Ambulatory Visit (INDEPENDENT_AMBULATORY_CARE_PROVIDER_SITE_OTHER): Payer: BC Managed Care – PPO | Admitting: Family Medicine

## 2021-06-03 VITALS — BP 112/68 | HR 61 | Temp 98.3°F | Ht 65.0 in | Wt 251.0 lb

## 2021-06-03 DIAGNOSIS — F3289 Other specified depressive episodes: Secondary | ICD-10-CM | POA: Diagnosis not present

## 2021-06-03 DIAGNOSIS — Z6841 Body Mass Index (BMI) 40.0 and over, adult: Secondary | ICD-10-CM

## 2021-06-03 DIAGNOSIS — E8881 Metabolic syndrome: Secondary | ICD-10-CM | POA: Diagnosis not present

## 2021-06-03 NOTE — Progress Notes (Signed)
Chief Complaint:   OBESITY Gearline is here to discuss her progress with her obesity treatment plan along with follow-up of her obesity related diagnoses. Zaia is on the Category 3 Plan and states she is following her eating plan approximately 65-70% of the time. Gavin states she is doing 0 minutes 0 times per week.  Today's visit was #: 41 Starting weight: 338 lbs Starting date: 09/12/2020 Today's weight: 251 lbs Today's date: 06/03/2021 Total lbs lost to date: 87 Total lbs lost since last in-office visit: 3  Interim History: Eldoris continues to do well with weight loss. Her hunger is mostly controlled but she still struggles with some snacking. She is open to discussing Thanksgiving eating strategies.  Subjective:   1. Insulin resistance Lucyle is working on diet and exercise, and she is trying to decrease her simple carbohydrates. She has no signs of hypoglycemia or feeling lightheaded.  2. Other depression with emotional eating Shatiqua is on Lexapro 20 and 10 mg and she is doing well with this dose, with no side effects noted. She is dealing with the death of her dog in a healthy way. She denies suicidal ideations.  Assessment/Plan:   1. Insulin resistance Corry will continue with diet, exercise, and her eating plan to help decrease the risk of diabetes. Valetta agreed to follow-up with [place] as directed to closely monitor her progress.  2. Other depression with emotional eating Behavior modification techniques were discussed today to help Langley deal with her emotional/non-hunger eating behaviors. Aundra will continue Lexapro as is, and will continue to monitor. Orders and follow up as documented in patient record.   3. Obesity with current BMI 41.8 Addalee is currently in the action stage of change. As such, her goal is to continue with weight loss efforts. She has agreed to the Category 3 Plan.   Behavioral modification strategies:  increasing lean protein intake and holiday eating strategies .  Emanuella has agreed to follow-up with our clinic in 3 to 4 weeks. She was informed of the importance of frequent follow-up visits to maximize her success with intensive lifestyle modifications for her multiple health conditions.   Objective:   Blood pressure 112/68, pulse 61, temperature 98.3 F (36.8 C), temperature source Oral, height 5\' 5"  (1.651 m), weight 251 lb (113.9 kg), SpO2 98 %. Body mass index is 41.77 kg/m.  General: Cooperative, alert, well developed, in no acute distress. HEENT: Conjunctivae and lids unremarkable. Cardiovascular: Regular rhythm.  Lungs: Normal work of breathing. Neurologic: No focal deficits.   Lab Results  Component Value Date   CREATININE 0.61 01/28/2021   BUN 19 01/28/2021   NA 142 01/28/2021   K 4.8 01/28/2021   CL 110 (H) 01/28/2021   CO2 23 01/28/2021   Lab Results  Component Value Date   ALT 32 01/28/2021   AST 31 01/28/2021   ALKPHOS 80 01/28/2021   BILITOT 0.4 01/28/2021   Lab Results  Component Value Date   HGBA1C 5.2 01/28/2021   HGBA1C 5.2 12/27/2019   HGBA1C 5.3 09/12/2018   Lab Results  Component Value Date   INSULIN 7.8 01/28/2021   INSULIN 7.9 12/27/2019   INSULIN 8.8 09/12/2018   Lab Results  Component Value Date   TSH 2.41 06/05/2020   Lab Results  Component Value Date   CHOL 154 01/28/2021   HDL 66 01/28/2021   LDLCALC 80 01/28/2021   TRIG 32 01/28/2021   CHOLHDL 2.3 01/28/2021   Lab Results  Component Value Date  VD25OH 50.5 01/28/2021   VD25OH 63.4 06/05/2020   VD25OH 73.3 12/27/2019   Lab Results  Component Value Date   WBC 6.1 12/27/2019   HGB 14.2 12/27/2019   HCT 44.0 12/27/2019   MCV 91 12/27/2019   PLT 231 12/27/2019   Lab Results  Component Value Date   IRON 54 06/05/2020   TIBC 441 06/05/2020   FERRITIN 17 09/12/2018   Attestation Statements:   Reviewed by clinician on day of visit: allergies, medications, problem  list, medical history, surgical history, family history, social history, and previous encounter notes.  Time spent on visit including pre-visit chart review and post-visit care and charting was 30 minutes.    I, Burt Knack, am acting as transcriptionist for Quillian Quince, MD.  I have reviewed the above documentation for accuracy and completeness, and I agree with the above. -  Quillian Quince, MD

## 2021-06-26 ENCOUNTER — Other Ambulatory Visit (INDEPENDENT_AMBULATORY_CARE_PROVIDER_SITE_OTHER): Payer: Self-pay | Admitting: Family Medicine

## 2021-06-26 ENCOUNTER — Encounter (INDEPENDENT_AMBULATORY_CARE_PROVIDER_SITE_OTHER): Payer: Self-pay

## 2021-06-26 DIAGNOSIS — F3289 Other specified depressive episodes: Secondary | ICD-10-CM

## 2021-06-26 NOTE — Telephone Encounter (Signed)
Message sent to pt-CAS 

## 2021-06-26 NOTE — Telephone Encounter (Signed)
Pt last seen by Dr. Beasley.  

## 2021-06-30 NOTE — Telephone Encounter (Signed)
LAST APPOINTMENT DATE: 06/03/21 NEXT APPOINTMENT DATE: 07/02/21   [facility] DR[facility] STORE #[id number] - [place], [place] - [address] AT [facility] [address] [place] [address] Phone: [phone] Fax: [phone]  Patient is requesting a refill of the following medications: Pending Prescriptions:                       Disp   Refills   escitalopram (LEXAPRO) 10 MG tablet [Pharm*30 tab*0       Sig: TAKE 1 TABLET(10 MG) BY MOUTH DAILY   Date last filled: 10mg  -05/28/21                           20mg -04/30/21 # 90 Previously prescribed by Dr  Lab Results      Component                Value               Date                      HGBA1C                   5.2                 01/28/2021                HGBA1C                   5.2                 12/27/2019                HGBA1C                   5.3                 09/12/2018           Lab Results      Component                Value               Date                      LDLCALC                  80                  01/28/2021                CREATININE               0.61                01/28/2021           Lab Results      Component                Value               Date                      VD25OH                   50.5  01/28/2021                VD25OH                   63.4                06/05/2020                VD25OH                   73.3                12/27/2019            BP Readings from Last 3 Encounters: 06/03/21 : 112/68 05/01/21 : 123/76 04/03/21 : 108/67

## 2021-06-30 NOTE — Telephone Encounter (Signed)
Please advise-there are 2 messages for this refill

## 2021-07-02 ENCOUNTER — Other Ambulatory Visit: Payer: Self-pay

## 2021-07-02 ENCOUNTER — Ambulatory Visit (INDEPENDENT_AMBULATORY_CARE_PROVIDER_SITE_OTHER): Payer: BC Managed Care – PPO | Admitting: Family Medicine

## 2021-07-02 ENCOUNTER — Encounter (INDEPENDENT_AMBULATORY_CARE_PROVIDER_SITE_OTHER): Payer: Self-pay | Admitting: Family Medicine

## 2021-07-02 VITALS — BP 102/48 | HR 66 | Temp 98.2°F | Ht 65.0 in | Wt 249.0 lb

## 2021-07-02 DIAGNOSIS — F3289 Other specified depressive episodes: Secondary | ICD-10-CM | POA: Diagnosis not present

## 2021-07-02 DIAGNOSIS — E538 Deficiency of other specified B group vitamins: Secondary | ICD-10-CM | POA: Diagnosis not present

## 2021-07-02 DIAGNOSIS — E8881 Metabolic syndrome: Secondary | ICD-10-CM | POA: Diagnosis not present

## 2021-07-02 DIAGNOSIS — Z6841 Body Mass Index (BMI) 40.0 and over, adult: Secondary | ICD-10-CM

## 2021-07-02 DIAGNOSIS — Z9189 Other specified personal risk factors, not elsewhere classified: Secondary | ICD-10-CM

## 2021-07-02 DIAGNOSIS — E559 Vitamin D deficiency, unspecified: Secondary | ICD-10-CM | POA: Diagnosis not present

## 2021-07-02 NOTE — Progress Notes (Signed)
Chief Complaint:   OBESITY Rebecca Dominguez is here to discuss her progress with her obesity treatment plan along with follow-up of her obesity related diagnoses. Rebecca Dominguez is on the Category 3 Plan and states she is following her eating plan approximately 70% of the time. Rebecca Dominguez states she is doing 0 minutes 0 times per week.  Today's visit was #: 58 Starting weight: 338 lbs Starting date: 09/12/2020 Today's weight: 249 lbs Today's date: 07/02/2021 Total lbs lost to date: 89 Total lbs lost since last in-office visit: 2  Interim History: Rebecca Dominguez continues to do well with weight loss, even over Thanksgiving. She is making good strategies for the Christmas holiday.  Subjective:   1. Insulin resistance Rebecca Dominguez is doing well with diet and weight loss. She is due to have labs checked.  2. Vitamin D deficiency Rebecca Dominguez is stable on multivitamins and she is due for labs.  3. Vitamin B 12 deficiency Rebecca Dominguez is stable on multivitamins and a B12 rich diet. She is due to have labs.  4. Other depression with emotional eating Rebecca Dominguez is on Lexapro 30 mg total. She has been dealing with a lot of stressors recently for most of the year. She is doing well with minimizing emotional eating behaviors.  5. At risk for heart disease Rebecca Dominguez is at a higher than average risk for cardiovascular disease due to obesity.   Assessment/Plan:   1. Insulin resistance We will check labs today, and will follow up at her next visit. Rebecca Dominguez will continue to work on weight loss, exercise, and decreasing simple carbohydrates to help decrease the risk of diabetes. Rebecca Dominguez agreed to follow-up with [place] as directed to closely monitor her progress.  - CMP14+EGFR - Insulin, random - Hemoglobin A1c - TSH  2. Vitamin D deficiency Low Vitamin D level contributes to fatigue and are associated with obesity, breast, and colon cancer. We will check labs today, and Zondra will follow-up for routine  testing of Vitamin D, at least 2-3 times per year to avoid over-replacement.  - VITAMIN D 25 Hydroxy (Vit-D Deficiency, Fractures)  3. Vitamin B 12 deficiency The diagnosis was reviewed with the patient. We will check labs today, and will follow up at Rebecca Dominguez's next visit. Orders and follow up as documented in patient record.  - Vitamin B12  4. Other depression with emotional eating Behavior modification techniques were discussed today to help Rebecca Dominguez deal with her emotional/non-hunger eating behaviors. Rebecca Dominguez will continue Lexapro 10 mg and 20 mg daily (no refill needed), and we will follow up at her next visit. Orders and follow up as documented in patient record.   5. At risk for heart disease Rebecca Dominguez was given approximately 15 minutes of coronary artery disease prevention counseling today. She is 45 y.o. female and has risk factors for heart disease including obesity. We discussed intensive lifestyle modifications today with an emphasis on specific weight loss instructions and strategies.   Repetitive spaced learning was employed today to elicit superior memory formation and behavioral change.  6. Obesity with current BMI 41.4 Rebecca Dominguez is currently in the action stage of change. As such, her goal is to continue with weight loss efforts. She has agreed to the Category 3 Plan.   Behavioral modification strategies: emotional eating strategies and holiday eating strategies .  Rebecca Dominguez has agreed to follow-up with our clinic in 4 weeks. She was informed of the importance of frequent follow-up visits to maximize her success with intensive lifestyle modifications for her multiple health conditions.  Rebecca Dominguez was informed we would discuss her lab results at her next visit unless there is a critical issue that needs to be addressed sooner. Rebecca Dominguez agreed to keep her next visit at the agreed upon time to discuss these results.  Objective:   Blood pressure (!) 102/48, pulse 66,  temperature 98.2 F (36.8 C), height _0  (1.651 m), weight 249 lb (112.9 kg), SpO2 100 %. Body mass index is 41.44 kg/m.  General: Cooperative, alert, well developed, in no acute distress. HEENT: Conjunctivae and lids unremarkable. Cardiovascular: Regular rhythm.  Lungs: Normal work of breathing. Neurologic: No focal deficits.   Lab Results  Component Value Date   CREATININE 0.61 01/28/2021   BUN 19 01/28/2021   NA 142 01/28/2021   K 4.8 01/28/2021   CL 110 (H) 01/28/2021   CO2 23 01/28/2021   Lab Results  Component Value Date   ALT 32 01/28/2021   AST 31 01/28/2021   ALKPHOS 80 01/28/2021   BILITOT 0.4 01/28/2021   Lab Results  Component Value Date   HGBA1C 5.2 01/28/2021   HGBA1C 5.2 12/27/2019   HGBA1C 5.3 09/12/2018   Lab Results  Component Value Date   INSULIN 7.8 01/28/2021   INSULIN 7.9 12/27/2019   INSULIN 8.8 09/12/2018   Lab Results  Component Value Date   TSH 2.41 06/05/2020   Lab Results  Component Value Date   CHOL 154 01/28/2021   HDL 66 01/28/2021   LDLCALC 80 01/28/2021   TRIG 32 01/28/2021   CHOLHDL 2.3 01/28/2021   Lab Results  Component Value Date   VD25OH 50.5 01/28/2021   VD25OH 63.4 06/05/2020   VD25OH 73.3 12/27/2019   Lab Results  Component Value Date   WBC 6.1 12/27/2019   HGB 14.2 12/27/2019   HCT 44.0 12/27/2019   MCV 91 12/27/2019   PLT 231 12/27/2019   Lab Results  Component Value Date   IRON 54 06/05/2020   TIBC 441 06/05/2020   FERRITIN 17 09/12/2018   Attestation Statements:   Reviewed by clinician on day of visit: allergies, medications, problem list, medical history, surgical history, family history, social history, and previous encounter notes.   I, Trixie Dredge, am acting as transcriptionist for Dennard Nip, MD.  I have reviewed the above documentation for accuracy and completeness, and I agree with the above. -  Dennard Nip, MD

## 2021-07-03 LAB — CMP14+EGFR
ALT: 30 IU/L (ref 0–32)
AST: 32 IU/L (ref 0–40)
Albumin/Globulin Ratio: 1.8 (ref 1.2–2.2)
Albumin: 4.2 g/dL (ref 3.8–4.8)
Alkaline Phosphatase: 84 IU/L (ref 44–121)
BUN/Creatinine Ratio: 19 (ref 9–23)
BUN: 14 mg/dL (ref 6–24)
Bilirubin Total: 0.4 mg/dL (ref 0.0–1.2)
CO2: 21 mmol/L (ref 20–29)
Calcium: 9.4 mg/dL (ref 8.7–10.2)
Chloride: 105 mmol/L (ref 96–106)
Creatinine, Ser: 0.72 mg/dL (ref 0.57–1.00)
Globulin, Total: 2.3 g/dL (ref 1.5–4.5)
Glucose: 84 mg/dL (ref 70–99)
Potassium: 4.8 mmol/L (ref 3.5–5.2)
Sodium: 140 mmol/L (ref 134–144)
Total Protein: 6.5 g/dL (ref 6.0–8.5)
eGFR: 105 mL/min/{1.73_m2} (ref >59)

## 2021-07-03 LAB — TSH: TSH: 2.41 u[IU]/mL (ref 0.450–4.500)

## 2021-07-03 LAB — INSULIN, RANDOM: INSULIN: 9.4 u[IU]/mL (ref 2.6–24.9)

## 2021-07-03 LAB — VITAMIN D 25 HYDROXY (VIT D DEFICIENCY, FRACTURES): Vit D, 25-Hydroxy: 48.8 ng/mL (ref 30.0–100.0)

## 2021-07-03 LAB — VITAMIN B12: Vitamin B-12: 316 pg/mL (ref 232–1245)

## 2021-07-03 LAB — HEMOGLOBIN A1C
Est. average glucose Bld gHb Est-mCnc: 103 mg/dL
Hgb A1c MFr Bld: 5.2 % (ref 4.8–5.6)

## 2021-07-27 ENCOUNTER — Other Ambulatory Visit (INDEPENDENT_AMBULATORY_CARE_PROVIDER_SITE_OTHER): Payer: Self-pay | Admitting: Family Medicine

## 2021-07-27 DIAGNOSIS — F3289 Other specified depressive episodes: Secondary | ICD-10-CM

## 2021-07-28 ENCOUNTER — Encounter (INDEPENDENT_AMBULATORY_CARE_PROVIDER_SITE_OTHER): Payer: Self-pay | Admitting: Family Medicine

## 2021-07-28 DIAGNOSIS — F3289 Other specified depressive episodes: Secondary | ICD-10-CM

## 2021-07-28 NOTE — Telephone Encounter (Signed)
Ok to refill x 1  

## 2021-07-28 NOTE — Telephone Encounter (Signed)
LAST APPOINTMENT DATE: 07/02/21 NEXT APPOINTMENT DATE: 08/07/21   [facility] DR[facility]ORE #[id number] - [place], [place] - [address] AT [facility] OF [address] [place] [address] Phone: [phone] Fax: [phone]  [id number] - [place], [place] - [address] AT [address] [place] [address] Phone: [phone] Fax: [phone]  Patient is requesting a refill of the following medications: Requested Prescriptions    No prescriptions requested or ordered in this encounter    Date last filled: 04/30/21 For Lexapro 20mg  06/30/21 for Lexapro 10mg  Previously prescribed by Dr. 14/12/22  Lab Results  Component Value Date   HGBA1C 5.2 07/02/2021   HGBA1C 5.2 01/28/2021   HGBA1C 5.2 12/27/2019   Lab Results  Component Value Date   LDLCALC 80 01/28/2021   CREATININE 0.72 07/02/2021   Lab Results  Component Value Date   VD25OH 48.8 07/02/2021   VD25OH 50.5 01/28/2021   VD25OH 63.4 06/05/2020    BP Readings from Last 3 Encounters:  07/02/21 (!) 102/48  06/03/21 112/68  05/01/21 123/76

## 2021-07-30 ENCOUNTER — Telehealth (INDEPENDENT_AMBULATORY_CARE_PROVIDER_SITE_OTHER): Payer: Self-pay | Admitting: Family Medicine

## 2021-07-30 MED ORDER — ESCITALOPRAM OXALATE 10 MG PO TABS: ORAL_TABLET | ORAL | 0 refills | Status: DC

## 2021-07-30 NOTE — Telephone Encounter (Signed)
Hi, Ok, I did tee up the medication to be sent as I do most of our request.  I thought that was the way to do it.

## 2021-07-30 NOTE — Telephone Encounter (Signed)
Is it ok to refill? 

## 2021-07-30 NOTE — Telephone Encounter (Signed)
Look at the [facility] message from 2 days ago please

## 2021-07-30 NOTE — Telephone Encounter (Signed)
Patient checking status of refill request for lexapro. [facility] [address].

## 2021-08-06 ENCOUNTER — Encounter (INDEPENDENT_AMBULATORY_CARE_PROVIDER_SITE_OTHER): Payer: Self-pay | Admitting: Family Medicine

## 2021-08-06 ENCOUNTER — Ambulatory Visit (INDEPENDENT_AMBULATORY_CARE_PROVIDER_SITE_OTHER): Payer: BC Managed Care – PPO | Admitting: Family Medicine

## 2021-08-06 ENCOUNTER — Other Ambulatory Visit: Payer: Self-pay

## 2021-08-06 VITALS — BP 106/67 | HR 76 | Temp 98.3°F | Ht 65.0 in | Wt 248.0 lb

## 2021-08-06 DIAGNOSIS — Z6841 Body Mass Index (BMI) 40.0 and over, adult: Secondary | ICD-10-CM

## 2021-08-06 DIAGNOSIS — F3289 Other specified depressive episodes: Secondary | ICD-10-CM

## 2021-08-06 DIAGNOSIS — S76219A Strain of adductor muscle, fascia and tendon of unspecified thigh, initial encounter: Secondary | ICD-10-CM

## 2021-08-06 MED ORDER — ESCITALOPRAM OXALATE 20 MG PO TABS: 20.0000 mg | ORAL_TABLET | Freq: Every day | ORAL | 0 refills | Status: DC

## 2021-08-06 MED ORDER — ESCITALOPRAM OXALATE 20 MG PO TABS: ORAL_TABLET | ORAL | 0 refills | Status: DC

## 2021-08-06 MED ORDER — ESCITALOPRAM OXALATE 10 MG PO TABS: 10.0000 mg | ORAL_TABLET | Freq: Every day | ORAL | 0 refills | Status: DC

## 2021-08-06 MED ORDER — ESCITALOPRAM OXALATE 10 MG PO TABS: ORAL_TABLET | ORAL | 0 refills | Status: DC

## 2021-08-06 NOTE — Progress Notes (Signed)
Chief Complaint:   OBESITY Rebecca Dominguez is here to discuss her progress with her obesity treatment plan along with follow-up of her obesity related diagnoses. Rebecca Dominguez is on the Category 3 Plan and states she is following her eating plan approximately 65-70% of the time. Rebecca Dominguez states she is doing 0 minutes 0 times per week.  Today's visit was #: 43 Starting weight: 338 lbs Starting date: 09/12/2020 Today's weight: 248 lbs Today's date: 08/06/2021 Total lbs lost to date: 90 Total lbs lost since last in-office visit: 1  Interim History: Rebecca Dominguez has done well with avoiding holiday weight gain. She was able to portion control and she has gotten back on track. Her hunger is mostly controlled and she is working on meeting her protein goals.  Subjective:   1. Strain of tendon of medial thigh muscle Rebecca Dominguez notes right foot pain below her medial malleolus. No trauma, with full range of motion. She notes it hurst worse when walking without arch support for 2 weeks. She is able to bare full weight on her joint.   2. Other depression with emotional eating Rebecca Dominguez is stable on Rebecca Dominguez 30 mg (20mg , 10 mg).  Assessment/Plan:   1. Strain of tendon of medial thigh muscle Rebecca Dominguez was advised to minimize walking without arch support and OTC NSAIDS. If no improvement in the next 2 weeks, she will be referred to Podiatry.   2. Other depression with emotional eating Behavior modification techniques were discussed today to help Rebecca Dominguez deal with her emotional/non-hunger eating behaviors. We will refill Rebecca Dominguez 20 mg, and Rebecca Dominguez 10 mg for 90 days with no refills. Orders and follow up as documented in patient record.   3. Obesity with current BMI 41.3 Rebecca Dominguez is currently in the action stage of change. As such, her goal is to continue with weight loss efforts. She has agreed to the Category 3 Plan.   Behavioral modification strategies: increasing lean protein intake.  Rebecca Dominguez has  agreed to follow-up with our clinic in 3 to 4 weeks. She was informed of the importance of frequent follow-up visits to maximize her success with intensive lifestyle modifications for her multiple health conditions.   Objective:   Blood pressure 106/67, pulse 76, temperature 98.3 F (36.8 C), height 5\' 5"  (1.651 m), weight 248 lb (112.5 kg), SpO2 99 %. Body mass index is 41.27 kg/m.  General: Cooperative, alert, well developed, in no acute distress. HEENT: Conjunctivae and lids unremarkable. Cardiovascular: Regular rhythm.  Lungs: Normal work of breathing. Neurologic: No focal deficits.   Lab Results  Component Value Date   CREATININE 0.72 07/02/2021   BUN 14 07/02/2021   NA 140 07/02/2021   K 4.8 07/02/2021   CL 105 07/02/2021   CO2 21 07/02/2021   Lab Results  Component Value Date   ALT 30 07/02/2021   AST 32 07/02/2021   ALKPHOS 84 07/02/2021   BILITOT 0.4 07/02/2021   Lab Results  Component Value Date   HGBA1C 5.2 07/02/2021   HGBA1C 5.2 01/28/2021   HGBA1C 5.2 12/27/2019   HGBA1C 5.3 09/12/2018   Lab Results  Component Value Date   INSULIN 9.4 07/02/2021   INSULIN 7.8 01/28/2021   INSULIN 7.9 12/27/2019   INSULIN 8.8 09/12/2018   Lab Results  Component Value Date   TSH 2.410 07/02/2021   Lab Results  Component Value Date   CHOL 154 01/28/2021   HDL 66 01/28/2021   LDLCALC 80 01/28/2021   TRIG 32 01/28/2021   CHOLHDL 2.3 01/28/2021  Lab Results  Component Value Date   VD25OH 48.8 07/02/2021   VD25OH 50.5 01/28/2021   VD25OH 63.4 06/05/2020   Lab Results  Component Value Date   WBC 6.1 12/27/2019   HGB 14.2 12/27/2019   HCT 44.0 12/27/2019   MCV 91 12/27/2019   PLT 231 12/27/2019   Lab Results  Component Value Date   IRON 54 06/05/2020   TIBC 441 06/05/2020   FERRITIN 17 09/12/2018   Attestation Statements:   Reviewed by clinician on day of visit: allergies, medications, problem list, medical history, surgical history, family history,  social history, and previous encounter notes.   I, Burt Knack, am acting as transcriptionist for Quillian Quince, MD.  I have reviewed the above documentation for accuracy and completeness, and I agree with the above. -  Quillian Quince, MD

## 2021-09-19 ENCOUNTER — Other Ambulatory Visit: Payer: Self-pay | Admitting: Family Medicine

## 2021-09-19 DIAGNOSIS — Z1231 Encounter for screening mammogram for malignant neoplasm of breast: Secondary | ICD-10-CM

## 2021-09-22 ENCOUNTER — Ambulatory Visit (INDEPENDENT_AMBULATORY_CARE_PROVIDER_SITE_OTHER): Payer: BC Managed Care – PPO | Admitting: Family Medicine

## 2021-09-22 ENCOUNTER — Encounter (INDEPENDENT_AMBULATORY_CARE_PROVIDER_SITE_OTHER): Payer: Self-pay | Admitting: Family Medicine

## 2021-09-22 ENCOUNTER — Other Ambulatory Visit: Payer: Self-pay

## 2021-09-22 VITALS — BP 106/70 | HR 71 | Temp 98.3°F | Ht 65.0 in | Wt 245.0 lb

## 2021-09-22 DIAGNOSIS — F4321 Adjustment disorder with depressed mood: Secondary | ICD-10-CM | POA: Diagnosis not present

## 2021-09-22 DIAGNOSIS — Z6841 Body Mass Index (BMI) 40.0 and over, adult: Secondary | ICD-10-CM | POA: Diagnosis not present

## 2021-09-22 DIAGNOSIS — F3289 Other specified depressive episodes: Secondary | ICD-10-CM | POA: Diagnosis not present

## 2021-09-22 DIAGNOSIS — E669 Obesity, unspecified: Secondary | ICD-10-CM | POA: Diagnosis not present

## 2021-09-24 NOTE — Progress Notes (Signed)
? ? ? ?Chief Complaint:  ? ?OBESITY ?Michellee is here to discuss her progress with her obesity treatment plan along with follow-up of her obesity related diagnoses. Myria is on the Category 3 Plan and states she is following her eating plan approximately 50% of the time. Sonam states she is doing 0 minutes 0 times per week. ? ?Today's visit was #: 73 ?Starting weight: 338 lbs ?Starting date: 09/12/2020 ?Today's weight: 245 lbs ?Today's date: 09/22/2021 ?Total lbs lost to date: 11 ?Total lbs lost since last in-office visit: 3 ? ?Interim History: Cydnee has done well with weight loss, but she is dealing with grief and she hasn't been able to concentrate on her eating as much as normal. She has done some comfort eating, but she has also missed some meals. ? ?Subjective:  ? ?1. Grieving ?Shiryl is grieving the loss of her dog. She is tearful in the office with discussing this loss. ? ?2. Other depression with emotional eating ?Gicelle is stable on Lexapro although she is dealing with grief. She shows no signs of suicidal or homicidal ideations. She has no signs of serotonin syndrome. ? ?Assessment/Plan:  ? ?1. Grieving ?I reassured the patient of her grief and loss, and guilt is a normal part of the process and she was offered support and encouragement. ? ?2. Other depression with emotional eating ?Elise will continue Lexapro, no refill needed. We will continue to monitor closely and manage prescription medications.  Orders and follow up as documented in patient record.  ? ?3. Obesity with current BMI 40.8 ?Denisia is currently in the action stage of change. As such, her goal is to continue with weight loss efforts. She has agreed to the Category 3 Plan.  ? ?We will recheck fasting labs at her next visit. ? ?Behavioral modification strategies: no skipping meals and emotional eating strategies. ? ?Devion has agreed to follow-up with our clinic in 4 weeks. She was informed of the importance of  frequent follow-up visits to maximize her success with intensive lifestyle modifications for her multiple health conditions.  ? ?Objective:  ? ?Blood pressure 106/70, pulse 71, temperature 98.3 ?F (36.8 ?C), height 5\' 5"  (1.651 m), weight 245 lb (111.1 kg), SpO2 99 %. ?Body mass index is 40.77 kg/m?. ? ?General: Cooperative, alert, well developed, in no acute distress. ?HEENT: Conjunctivae and lids unremarkable. ?Cardiovascular: Regular rhythm.  ?Lungs: Normal work of breathing. ?Neurologic: No focal deficits.  ? ?Lab Results  ?Component Value Date  ? CREATININE 0.72 07/02/2021  ? BUN 14 07/02/2021  ? NA 140 07/02/2021  ? K 4.8 07/02/2021  ? CL 105 07/02/2021  ? CO2 21 07/02/2021  ? ?Lab Results  ?Component Value Date  ? ALT 30 07/02/2021  ? AST 32 07/02/2021  ? ALKPHOS 84 07/02/2021  ? BILITOT 0.4 07/02/2021  ? ?Lab Results  ?Component Value Date  ? HGBA1C 5.2 07/02/2021  ? HGBA1C 5.2 01/28/2021  ? HGBA1C 5.2 12/27/2019  ? HGBA1C 5.3 09/12/2018  ? ?Lab Results  ?Component Value Date  ? INSULIN 9.4 07/02/2021  ? INSULIN 7.8 01/28/2021  ? INSULIN 7.9 12/27/2019  ? INSULIN 8.8 09/12/2018  ? ?Lab Results  ?Component Value Date  ? TSH 2.410 07/02/2021  ? ?Lab Results  ?Component Value Date  ? CHOL 154 01/28/2021  ? HDL 66 01/28/2021  ? [place] 80 01/28/2021  ? TRIG 32 01/28/2021  ? CHOLHDL 2.3 01/28/2021  ? ?Lab Results  ?Component Value Date  ? VD25OH 48.8 07/02/2021  ? VD25OH 50.5  01/28/2021  ? VD25OH 63.4 06/05/2020  ? ?Lab Results  ?Component Value Date  ? WBC 6.1 12/27/2019  ? HGB 14.2 12/27/2019  ? HCT 44.0 12/27/2019  ? MCV 91 12/27/2019  ? PLT 231 12/27/2019  ? ?Lab Results  ?Component Value Date  ? IRON 54 06/05/2020  ? TIBC 441 06/05/2020  ? FERRITIN 17 09/12/2018  ? ?Attestation Statements:  ? ?Reviewed by clinician on day of visit: allergies, medications, problem list, medical history, surgical history, family history, social history, and previous encounter notes. ? ?Time spent on visit including pre-visit  chart review and post-visit care and charting was 32 minutes.  ? ? ?I, Trixie Dredge, am acting as transcriptionist for Dennard Nip, MD. ? ?I have reviewed the above documentation for accuracy and completeness, and I agree with the above. -  Dennard Nip, MD ? ? ?

## 2021-10-10 ENCOUNTER — Ambulatory Visit
Admission: RE | Admit: 2021-10-10 | Discharge: 2021-10-10 | Disposition: A | Discharge status: Home / Self Care | Payer: BC Managed Care – PPO | Source: Ambulatory Visit | Attending: Family Medicine | Admitting: Family Medicine

## 2021-10-10 DIAGNOSIS — Z1231 Encounter for screening mammogram for malignant neoplasm of breast: Secondary | ICD-10-CM

## 2021-10-10 HISTORY — DX: Unspecified lump in unspecified breast: N63.0

## 2021-10-13 ENCOUNTER — Other Ambulatory Visit: Payer: Self-pay | Admitting: Family Medicine

## 2021-10-13 DIAGNOSIS — R928 Other abnormal and inconclusive findings on diagnostic imaging of breast: Secondary | ICD-10-CM

## 2021-10-30 ENCOUNTER — Encounter (INDEPENDENT_AMBULATORY_CARE_PROVIDER_SITE_OTHER): Payer: Self-pay | Admitting: Family Medicine

## 2021-10-30 ENCOUNTER — Ambulatory Visit (INDEPENDENT_AMBULATORY_CARE_PROVIDER_SITE_OTHER): Payer: BC Managed Care – PPO | Admitting: Family Medicine

## 2021-10-30 VITALS — BP 111/70 | HR 79 | Temp 98.1°F | Ht 65.0 in | Wt 251.0 lb

## 2021-10-30 DIAGNOSIS — F3289 Other specified depressive episodes: Secondary | ICD-10-CM

## 2021-10-30 DIAGNOSIS — K5909 Other constipation: Secondary | ICD-10-CM | POA: Diagnosis not present

## 2021-10-30 DIAGNOSIS — E669 Obesity, unspecified: Secondary | ICD-10-CM

## 2021-10-30 DIAGNOSIS — Z9189 Other specified personal risk factors, not elsewhere classified: Secondary | ICD-10-CM

## 2021-10-30 DIAGNOSIS — Z6841 Body Mass Index (BMI) 40.0 and over, adult: Secondary | ICD-10-CM

## 2021-10-30 DIAGNOSIS — E8881 Metabolic syndrome: Secondary | ICD-10-CM

## 2021-10-30 DIAGNOSIS — E559 Vitamin D deficiency, unspecified: Secondary | ICD-10-CM

## 2021-10-30 MED ORDER — ESCITALOPRAM OXALATE 10 MG PO TABS: 10.0000 mg | ORAL_TABLET | Freq: Every day | ORAL | 0 refills | Status: DC

## 2021-10-30 MED ORDER — ESCITALOPRAM OXALATE 20 MG PO TABS: 20.0000 mg | ORAL_TABLET | Freq: Every day | ORAL | 0 refills | Status: DC

## 2021-10-31 LAB — LIPID PANEL WITH LDL/HDL RATIO
Cholesterol, Total: 143 mg/dL (ref 100–199)
HDL: 69 mg/dL (ref >39)
LDL Chol Calc (NIH): 65 mg/dL (ref 0–99)
LDL/HDL Ratio: 0.9 ratio (ref 0.0–3.2)
Triglycerides: 38 mg/dL (ref 0–149)
VLDL Cholesterol Cal: 9 mg/dL (ref 5–40)

## 2021-10-31 LAB — VITAMIN D 25 HYDROXY (VIT D DEFICIENCY, FRACTURES): Vit D, 25-Hydroxy: 46.2 ng/mL (ref 30.0–100.0)

## 2021-10-31 LAB — HEMOGLOBIN A1C
Est. average glucose Bld gHb Est-mCnc: 105 mg/dL
Hgb A1c MFr Bld: 5.3 % (ref 4.8–5.6)

## 2021-10-31 LAB — CMP14+EGFR
ALT: 31 IU/L (ref 0–32)
AST: 30 IU/L (ref 0–40)
Albumin/Globulin Ratio: 1.9 (ref 1.2–2.2)
Albumin: 4 g/dL (ref 3.8–4.8)
Alkaline Phosphatase: 72 IU/L (ref 44–121)
BUN/Creatinine Ratio: 24 — ABNORMAL HIGH (ref 9–23)
BUN: 16 mg/dL (ref 6–24)
Bilirubin Total: 0.4 mg/dL (ref 0.0–1.2)
CO2: 24 mmol/L (ref 20–29)
Calcium: 9.5 mg/dL (ref 8.7–10.2)
Chloride: 106 mmol/L (ref 96–106)
Creatinine, Ser: 0.68 mg/dL (ref 0.57–1.00)
Globulin, Total: 2.1 g/dL (ref 1.5–4.5)
Glucose: 79 mg/dL (ref 70–99)
Potassium: 4.7 mmol/L (ref 3.5–5.2)
Sodium: 142 mmol/L (ref 134–144)
Total Protein: 6.1 g/dL (ref 6.0–8.5)
eGFR: 109 mL/min/{1.73_m2} (ref >59)

## 2021-10-31 LAB — CBC WITH DIFFERENTIAL/PLATELET
Basophils Absolute: 0 10*3/uL (ref 0.0–0.2)
Basos: 1 %
EOS (ABSOLUTE): 0.1 10*3/uL (ref 0.0–0.4)
Eos: 2 %
Hematocrit: 39.2 % (ref 34.0–46.6)
Hemoglobin: 13 g/dL (ref 11.1–15.9)
Immature Grans (Abs): 0 10*3/uL (ref 0.0–0.1)
Immature Granulocytes: 0 %
Lymphocytes Absolute: 1.4 10*3/uL (ref 0.7–3.1)
Lymphs: 38 %
MCH: 29.9 pg (ref 26.6–33.0)
MCHC: 33.2 g/dL (ref 31.5–35.7)
MCV: 90 fL (ref 79–97)
Monocytes Absolute: 0.3 10*3/uL (ref 0.1–0.9)
Monocytes: 9 %
Neutrophils Absolute: 1.8 10*3/uL (ref 1.4–7.0)
Neutrophils: 50 %
Platelets: 191 10*3/uL (ref 150–450)
RBC: 4.35 x10E6/uL (ref 3.77–5.28)
RDW: 12.6 % (ref 11.7–15.4)
WBC: 3.7 10*3/uL (ref 3.4–10.8)

## 2021-10-31 LAB — TSH: TSH: 2.36 u[IU]/mL (ref 0.450–4.500)

## 2021-10-31 LAB — VITAMIN B12: Vitamin B-12: 284 pg/mL (ref 232–1245)

## 2021-10-31 LAB — INSULIN, RANDOM: INSULIN: 5.7 u[IU]/mL (ref 2.6–24.9)

## 2021-11-04 ENCOUNTER — Ambulatory Visit
Admission: RE | Admit: 2021-11-04 | Discharge: 2021-11-04 | Disposition: A | Discharge status: Home / Self Care | Payer: BC Managed Care – PPO | Source: Ambulatory Visit | Attending: Family Medicine | Admitting: Family Medicine

## 2021-11-04 DIAGNOSIS — R928 Other abnormal and inconclusive findings on diagnostic imaging of breast: Secondary | ICD-10-CM

## 2021-11-11 NOTE — Progress Notes (Signed)
? ? ? ?Chief Complaint:  ? ?OBESITY ?Rebecca Dominguez is here to discuss her progress with her obesity treatment plan along with follow-up of her obesity related diagnoses. Rebecca Dominguez is on the Category 3 Plan and states she is following her eating plan approximately 65-70% of the time. Rebecca Dominguez states she is doing 0 minutes 0 times per week. ? ?Today's visit was #: 81 ?Starting weight: 338 lbs ?Starting date: 09/12/2020 ?Today's weight: 251 lbs ?Today's date: 10/30/2021 ?Total lbs lost to date: 5 ?Total lbs lost since last in-office visit: 0 ? ?Interim History: Rionna is retaining fluid today. She is feeling this is related to hormones. She has had a lot of stressors recently, but she is working on minimizing emotional eating behaviors.  ? ?Subjective:  ? ?1. Other constipation ?Rebecca Dominguez notes some constipation, and she takes fiber gummies with some relief.  ? ?2. Vitamin D deficiency ?Rebecca Dominguez on Vitamin in multivitamins, with no side effects noted. She is due to have labs.  ? ?3. Insulin resistance ?Rebecca Dominguez is working on diet and exercise, and she is due for labs. She notes polyphagia has improved.  ? ?4. Other depression with emotional eating ?Rebecca Dominguez is stable on medications, and she has had a lot of stressors in her life. She continues to try to decrease emotional eating behaviors, and her mood is appropriate.  ? ?5. At risk for heart disease ?Rebecca Dominguez is at higher than average risk for cardiovascular disease due to obesity. ? ?Assessment/Plan:  ? ?1. Other constipation ?Rebecca Dominguez will continue her fiber supplements, take miralax OTC as needed, and increase her water intake.  ? ?2. Vitamin D deficiency ?Low Vitamin D level contributes to fatigue and are associated with obesity, breast, and colon cancer. We will check labs today, and Rebecca Dominguez will follow-up for routine testing of Vitamin D, at least 2-3 times per year to avoid over-replacement. ? ?- Vitamin B12 ?- VITAMIN D 25 Hydroxy (Vit-D Deficiency,  Fractures) ? ?3. Insulin resistance ?We will check labs today. Rebecca Dominguez will continue to work on weight loss, exercise, and decreasing simple carbohydrates to help decrease the risk of diabetes. Rebecca Dominguez agreed to follow-up with [place] as directed to closely monitor her progress. ? ?- CMP14+EGFR ?- Lipid Panel With LDL/HDL Ratio ?- Insulin, random ?- Hemoglobin A1c ?- TSH ? ?4. Other depression with emotional eating ?We will check labs today. We will refill Lexapro 10 mg and 20 mg for 90 days with no refills. Behavior modification techniques were discussed today to help Amayiah deal with her emotional/non-hunger eating behaviors.  Orders and follow up as documented in patient record.  ? ?- escitalopram (LEXAPRO) 10 MG tablet; Take 1 tablet (10 mg total) by mouth daily.  Dispense: 90 tablet; Refill: 0 ?- escitalopram (LEXAPRO) 20 MG tablet; Take 1 tablet (20 mg total) by mouth daily.  Dispense: 90 tablet; Refill: 0 ?- CBC with Differential/Platelet ? ?5. At risk for heart disease ?Rebecca Dominguez was given approximately 15 minutes of coronary artery disease prevention counseling today. She is 45 y.o. female and has risk factors for heart disease including obesity. We discussed intensive lifestyle modifications today with an emphasis on specific weight loss instructions and strategies. ? ?Repetitive spaced learning was employed today to elicit superior memory formation and behavioral change.  ? ?6. Obesity with current BMI 41.9 ?Rebecca Dominguez is currently in the action stage of change. As such, her goal is to continue with weight loss efforts. She has agreed to the Category 3 Plan.  ? ?Behavioral modification strategies: increasing lean protein  intake and emotional eating strategies. ? ?Rebecca Dominguez has agreed to follow-up with our clinic in 5 weeks. She was informed of the importance of frequent follow-up visits to maximize her success with intensive lifestyle modifications for her multiple health conditions.  ? ?Objective:   ? ?Blood pressure 111/70, pulse 79, temperature 98.1 ?F (36.7 ?C), height 5' 5"  (1.651 m), weight 251 lb (113.9 kg), SpO2 99 %. ?Body mass index is 41.77 kg/m?. ? ?General: Cooperative, alert, well developed, in no acute distress. ?HEENT: Conjunctivae and lids unremarkable. ?Cardiovascular: Regular rhythm.  ?Lungs: Normal work of breathing. ?Neurologic: No focal deficits.  ? ?Lab Results  ?Component Value Date  ? CREATININE 0.68 10/30/2021  ? BUN 16 10/30/2021  ? NA 142 10/30/2021  ? K 4.7 10/30/2021  ? CL 106 10/30/2021  ? CO2 24 10/30/2021  ? ?Lab Results  ?Component Value Date  ? ALT 31 10/30/2021  ? AST 30 10/30/2021  ? ALKPHOS 72 10/30/2021  ? BILITOT 0.4 10/30/2021  ? ?Lab Results  ?Component Value Date  ? HGBA1C 5.3 10/30/2021  ? HGBA1C 5.2 07/02/2021  ? HGBA1C 5.2 01/28/2021  ? HGBA1C 5.2 12/27/2019  ? HGBA1C 5.3 09/12/2018  ? ?Lab Results  ?Component Value Date  ? INSULIN 5.7 10/30/2021  ? INSULIN 9.4 07/02/2021  ? INSULIN 7.8 01/28/2021  ? INSULIN 7.9 12/27/2019  ? INSULIN 8.8 09/12/2018  ? ?Lab Results  ?Component Value Date  ? TSH 2.360 10/30/2021  ? ?Lab Results  ?Component Value Date  ? CHOL 143 10/30/2021  ? HDL 69 10/30/2021  ? [place] 65 10/30/2021  ? TRIG 38 10/30/2021  ? CHOLHDL 2.3 01/28/2021  ? ?Lab Results  ?Component Value Date  ? VD25OH 46.2 10/30/2021  ? VD25OH 48.8 07/02/2021  ? VD25OH 50.5 01/28/2021  ? ?Lab Results  ?Component Value Date  ? WBC 3.7 10/30/2021  ? HGB 13.0 10/30/2021  ? HCT 39.2 10/30/2021  ? MCV 90 10/30/2021  ? PLT 191 10/30/2021  ? ?Lab Results  ?Component Value Date  ? IRON 54 06/05/2020  ? TIBC 441 06/05/2020  ? FERRITIN 17 09/12/2018  ? ?Attestation Statements:  ? ?Reviewed by clinician on day of visit: allergies, medications, problem list, medical history, surgical history, family history, social history, and previous encounter notes. ? ? ?I, Trixie Dredge, am acting as transcriptionist for Dennard Nip, MD. ? ?I have reviewed the above documentation for accuracy  and completeness, and I agree with the above. -  Dennard Nip, MD ? ? ?

## 2021-12-04 ENCOUNTER — Ambulatory Visit (INDEPENDENT_AMBULATORY_CARE_PROVIDER_SITE_OTHER): Payer: BC Managed Care – PPO | Admitting: Family Medicine

## 2021-12-04 ENCOUNTER — Encounter (INDEPENDENT_AMBULATORY_CARE_PROVIDER_SITE_OTHER): Payer: Self-pay | Admitting: Family Medicine

## 2021-12-04 VITALS — BP 110/65 | HR 61 | Temp 98.1°F | Ht 65.0 in | Wt 251.0 lb

## 2021-12-04 DIAGNOSIS — E669 Obesity, unspecified: Secondary | ICD-10-CM | POA: Diagnosis not present

## 2021-12-04 DIAGNOSIS — F3289 Other specified depressive episodes: Secondary | ICD-10-CM

## 2021-12-04 DIAGNOSIS — Z6841 Body Mass Index (BMI) 40.0 and over, adult: Secondary | ICD-10-CM | POA: Diagnosis not present

## 2021-12-18 NOTE — Progress Notes (Signed)
Chief Complaint:   OBESITY Rebecca Dominguez is here to discuss her progress with her obesity treatment plan along with follow-up of her obesity related diagnoses. Rebecca Dominguez is on the Category 3 Plan and states she is following her eating plan approximately 50-60% of the time. Rebecca Dominguez states she is doing 0 minutes 0 times per week.  Today's visit was #: 46 Starting weight: 338 lbs Starting date: 09/12/2020 Today's weight: 251 lbs Today's date: 12/04/2021 Total lbs lost to date: 87 Total lbs lost since last in-office visit: 0  Interim History: Rebecca Dominguez has done well with maintaining her weight loss. She has had some extra stressors and meal planning has been difficult. She is feeling overwhelmed at times and she is doing more lower protein dinner choices.   Subjective:   1. Other depression with emotional eating Rebecca Dominguez is on Lexapro 30 mg. She still dealing with a lot of stress. She is working on decreasing her emotional eating behaviors, and she has done well with maintaining her weight loss.   Assessment/Plan:   1. Other depression with emotional eating Emotional eating strategies were discussed. Rebecca Dominguez will continue Lexapro as is and will continue to follow.   2. Obesity, Current BMI 41.9 Rebecca Dominguez is currently in the action stage of change. As such, her goal is to continue with weight loss efforts. She has agreed to the Category 3 Plan.   Behavioral modification strategies: meal planning and cooking strategies and keeping healthy foods in the home.  Rebecca Dominguez has agreed to follow-up with our clinic in 7 to 8 weeks. She was informed of the importance of frequent follow-up visits to maximize her success with intensive lifestyle modifications for her multiple health conditions.   Objective:   Blood pressure 110/65, pulse 61, temperature 98.1 F (36.7 C), height 5\' 5"  (1.651 m), weight 251 lb (113.9 kg), SpO2 98 %. Body mass index is 41.77 kg/m.  General: Cooperative,  alert, well developed, in no acute distress. HEENT: Conjunctivae and lids unremarkable. Cardiovascular: Regular rhythm.  Lungs: Normal work of breathing. Neurologic: No focal deficits.   Lab Results  Component Value Date   CREATININE 0.68 10/30/2021   BUN 16 10/30/2021   NA 142 10/30/2021   K 4.7 10/30/2021   CL 106 10/30/2021   CO2 24 10/30/2021   Lab Results  Component Value Date   ALT 31 10/30/2021   AST 30 10/30/2021   ALKPHOS 72 10/30/2021   BILITOT 0.4 10/30/2021   Lab Results  Component Value Date   HGBA1C 5.3 10/30/2021   HGBA1C 5.2 07/02/2021   HGBA1C 5.2 01/28/2021   HGBA1C 5.2 12/27/2019   HGBA1C 5.3 09/12/2018   Lab Results  Component Value Date   INSULIN 5.7 10/30/2021   INSULIN 9.4 07/02/2021   INSULIN 7.8 01/28/2021   INSULIN 7.9 12/27/2019   INSULIN 8.8 09/12/2018   Lab Results  Component Value Date   TSH 2.360 10/30/2021   Lab Results  Component Value Date   CHOL 143 10/30/2021   HDL 69 10/30/2021   LDLCALC 65 10/30/2021   TRIG 38 10/30/2021   CHOLHDL 2.3 01/28/2021   Lab Results  Component Value Date   VD25OH 46.2 10/30/2021   VD25OH 48.8 07/02/2021   VD25OH 50.5 01/28/2021   Lab Results  Component Value Date   WBC 3.7 10/30/2021   HGB 13.0 10/30/2021   HCT 39.2 10/30/2021   MCV 90 10/30/2021   PLT 191 10/30/2021   Lab Results  Component Value Date   IRON  54 06/05/2020   TIBC 441 06/05/2020   FERRITIN 17 09/12/2018   Attestation Statements:   Reviewed by clinician on day of visit: allergies, medications, problem list, medical history, surgical history, family history, social history, and previous encounter notes.  Time spent on visit including pre-visit chart review and post-visit care and charting was 30 minutes.    I, Burt Knack, am acting as transcriptionist for Quillian Quince, MD.  I have reviewed the above documentation for accuracy and completeness, and I agree with the above. -  Quillian Quince, MD

## 2022-01-26 ENCOUNTER — Encounter (INDEPENDENT_AMBULATORY_CARE_PROVIDER_SITE_OTHER): Payer: Self-pay | Admitting: Family Medicine

## 2022-01-26 ENCOUNTER — Ambulatory Visit (INDEPENDENT_AMBULATORY_CARE_PROVIDER_SITE_OTHER): Payer: BC Managed Care – PPO | Admitting: Family Medicine

## 2022-01-26 VITALS — BP 96/66 | HR 69 | Temp 97.9°F | Ht 65.0 in | Wt 250.0 lb

## 2022-01-26 DIAGNOSIS — E669 Obesity, unspecified: Secondary | ICD-10-CM

## 2022-01-26 DIAGNOSIS — F3289 Other specified depressive episodes: Secondary | ICD-10-CM | POA: Diagnosis not present

## 2022-01-26 DIAGNOSIS — Z6841 Body Mass Index (BMI) 40.0 and over, adult: Secondary | ICD-10-CM | POA: Diagnosis not present

## 2022-01-26 MED ORDER — ESCITALOPRAM OXALATE 10 MG PO TABS: 10.0000 mg | ORAL_TABLET | Freq: Every day | ORAL | 0 refills | Status: DC

## 2022-01-26 MED ORDER — ESCITALOPRAM OXALATE 20 MG PO TABS: 20.0000 mg | ORAL_TABLET | Freq: Every day | ORAL | 0 refills | Status: DC

## 2022-01-26 NOTE — Progress Notes (Unsigned)
Chief Complaint:   OBESITY Rebecca Dominguez is here to discuss her progress with her obesity treatment plan along with follow-up of her obesity related diagnoses. Rebecca Dominguez is on the Category 3 Plan and states she is following her eating plan approximately 50% of the time. Rebecca Dominguez states she was walking on her cruise.    Today's visit was #: 47 Starting weight: 338 lbs Starting date: 09/12/2020 Today's weight: 250 lbs Today's date: 01/26/2022 Total lbs lost to date: 88 Total lbs lost since last in-office visit: 1  Interim History: Rebecca Dominguez has done especially well with avoiding weight gain even while on vacation on an [place] cruise. She did a lot of walking and she was able to portion control well.   Subjective:   1. Other depression with emotional eating Rebecca Dominguez is doing well decreasing emotional eating behaviors. No problems with medications were mentioned.   Assessment/Plan:   1. Other depression with emotional eating Rebecca Dominguez will continue Lexapro 10 mg and 20 mg, and we will refill for 90 days.   - escitalopram (LEXAPRO) 10 MG tablet; Take 1 tablet (10 mg total) by mouth daily.  Dispense: 90 tablet; Refill: 0 - escitalopram (LEXAPRO) 20 MG tablet; Take 1 tablet (20 mg total) by mouth daily.  Dispense: 90 tablet; Refill: 0  2. Obesity, Current BMI 41.7 Rebecca Dominguez is currently in the action stage of change. As such, her goal is to continue with weight loss efforts. She has agreed to the Category 3 Plan.   Behavioral modification strategies: increasing lean protein intake and better snacking choices.  Rebecca Dominguez has agreed to follow-up with our clinic in 4 to 6 weeks. She was informed of the importance of frequent follow-up visits to maximize her success with intensive lifestyle modifications for her multiple health conditions.   Objective:   Blood pressure 96/66, pulse 69, temperature 97.9 F (36.6 C), height 5\' 5"  (1.651 m), weight 250 lb (113.4 kg), SpO2 99 %. Body  mass index is 41.6 kg/m.  General: Cooperative, alert, well developed, in no acute distress. HEENT: Conjunctivae and lids unremarkable. Cardiovascular: Regular rhythm.  Lungs: Normal work of breathing. Neurologic: No focal deficits.   Lab Results  Component Value Date   CREATININE 0.68 10/30/2021   BUN 16 10/30/2021   NA 142 10/30/2021   K 4.7 10/30/2021   CL 106 10/30/2021   CO2 24 10/30/2021   Lab Results  Component Value Date   ALT 31 10/30/2021   AST 30 10/30/2021   ALKPHOS 72 10/30/2021   BILITOT 0.4 10/30/2021   Lab Results  Component Value Date   HGBA1C 5.3 10/30/2021   HGBA1C 5.2 07/02/2021   HGBA1C 5.2 01/28/2021   HGBA1C 5.2 12/27/2019   HGBA1C 5.3 09/12/2018   Lab Results  Component Value Date   INSULIN 5.7 10/30/2021   INSULIN 9.4 07/02/2021   INSULIN 7.8 01/28/2021   INSULIN 7.9 12/27/2019   INSULIN 8.8 09/12/2018   Lab Results  Component Value Date   TSH 2.360 10/30/2021   Lab Results  Component Value Date   CHOL 143 10/30/2021   HDL 69 10/30/2021   LDLCALC 65 10/30/2021   TRIG 38 10/30/2021   CHOLHDL 2.3 01/28/2021   Lab Results  Component Value Date   VD25OH 46.2 10/30/2021   VD25OH 48.8 07/02/2021   VD25OH 50.5 01/28/2021   Lab Results  Component Value Date   WBC 3.7 10/30/2021   HGB 13.0 10/30/2021   HCT 39.2 10/30/2021   MCV 90 10/30/2021  PLT 191 10/30/2021   Lab Results  Component Value Date   IRON 54 06/05/2020   TIBC 441 06/05/2020   FERRITIN 17 09/12/2018   Attestation Statements:   Reviewed by clinician on day of visit: allergies, medications, problem list, medical history, surgical history, family history, social history, and previous encounter notes.  Time spent on visit including pre-visit chart review and post-visit care and charting was 43 minutes.   I, Burt Knack, am acting as transcriptionist for Quillian Quince, MD.  I have reviewed the above documentation for accuracy and completeness, and I agree  with the above. -  Quillian Quince, MD

## 2022-02-25 ENCOUNTER — Encounter (INDEPENDENT_AMBULATORY_CARE_PROVIDER_SITE_OTHER): Payer: Self-pay

## 2022-03-10 ENCOUNTER — Ambulatory Visit (INDEPENDENT_AMBULATORY_CARE_PROVIDER_SITE_OTHER): Payer: BC Managed Care – PPO | Admitting: Family Medicine

## 2022-03-10 ENCOUNTER — Encounter (INDEPENDENT_AMBULATORY_CARE_PROVIDER_SITE_OTHER): Payer: Self-pay | Admitting: Family Medicine

## 2022-03-10 VITALS — BP 106/51 | HR 71 | Temp 98.0°F | Ht 65.0 in | Wt 252.0 lb

## 2022-03-10 DIAGNOSIS — Z6841 Body Mass Index (BMI) 40.0 and over, adult: Secondary | ICD-10-CM | POA: Diagnosis not present

## 2022-03-10 DIAGNOSIS — E669 Obesity, unspecified: Secondary | ICD-10-CM

## 2022-03-10 DIAGNOSIS — F3289 Other specified depressive episodes: Secondary | ICD-10-CM | POA: Diagnosis not present

## 2022-03-18 NOTE — Progress Notes (Unsigned)
Chief Complaint:   OBESITY Shaconda is here to discuss her progress with her obesity treatment plan along with follow-up of her obesity related diagnoses. Antavia is on the Category 3 Plan and states she is following her eating plan approximately 60% of the time. Yan states she is doing 0 minutes 0 times per week.  Today's visit was #: 48 Starting weight: 338 lbs Starting date: 09/12/2020 Today's weight: 252 lbs Today's date: 03/10/2022 Total lbs lost to date: 86 Total lbs lost since last in-office visit: 0  Interim History: Jasmynn has had a lot of stress in the last month with expensive multiple vet emergencies. She naturally hasn't ben able to concentrate on meal planning, but still tried to be mindful.  Subjective:   1. Other depression with emotional eating Latorria is on Lexapro and she is working on decreasing emotional eating behaviors. She described a lot of stress in her life, but she was able to minimize emotional eating behaviors.   Assessment/Plan:   1. Other depression with emotional eating We will continue to mange medications, and follow-up in 4-6 weeks. Emotional eating behavior strategies were discussed.  2. Obesity, Current BMI 42.0 Sorrel is currently in the action stage of change. As such, her goal is to continue with weight loss efforts. She has agreed to the Category 3 Plan.   Behavioral modification strategies: emotional eating strategies.  Samiyah has agreed to follow-up with our clinic in 4 to 6 weeks. She was informed of the importance of frequent follow-up visits to maximize her success with intensive lifestyle modifications for her multiple health conditions.   Objective:   Blood pressure (!) 106/51, pulse 71, temperature 98 F (36.7 C), height 5\' 5"  (1.651 m), weight 252 lb (114.3 kg), SpO2 100 %. Body mass index is 41.93 kg/m.  General: Cooperative, alert, well developed, in no acute distress. HEENT: Conjunctivae and lids  unremarkable. Cardiovascular: Regular rhythm.  Lungs: Normal work of breathing. Neurologic: No focal deficits.   Lab Results  Component Value Date   CREATININE 0.68 10/30/2021   BUN 16 10/30/2021   NA 142 10/30/2021   K 4.7 10/30/2021   CL 106 10/30/2021   CO2 24 10/30/2021   Lab Results  Component Value Date   ALT 31 10/30/2021   AST 30 10/30/2021   ALKPHOS 72 10/30/2021   BILITOT 0.4 10/30/2021   Lab Results  Component Value Date   HGBA1C 5.3 10/30/2021   HGBA1C 5.2 07/02/2021   HGBA1C 5.2 01/28/2021   HGBA1C 5.2 12/27/2019   HGBA1C 5.3 09/12/2018   Lab Results  Component Value Date   INSULIN 5.7 10/30/2021   INSULIN 9.4 07/02/2021   INSULIN 7.8 01/28/2021   INSULIN 7.9 12/27/2019   INSULIN 8.8 09/12/2018   Lab Results  Component Value Date   TSH 2.360 10/30/2021   Lab Results  Component Value Date   CHOL 143 10/30/2021   HDL 69 10/30/2021   LDLCALC 65 10/30/2021   TRIG 38 10/30/2021   CHOLHDL 2.3 01/28/2021   Lab Results  Component Value Date   VD25OH 46.2 10/30/2021   VD25OH 48.8 07/02/2021   VD25OH 50.5 01/28/2021   Lab Results  Component Value Date   WBC 3.7 10/30/2021   HGB 13.0 10/30/2021   HCT 39.2 10/30/2021   MCV 90 10/30/2021   PLT 191 10/30/2021   Lab Results  Component Value Date   IRON 54 06/05/2020   TIBC 441 06/05/2020   FERRITIN 17 09/12/2018   Attestation  Statements:   Reviewed by clinician on day of visit: allergies, medications, problem list, medical history, surgical history, family history, social history, and previous encounter notes.  Time spent on visit including pre-visit chart review and post-visit care and charting was 30 minutes.   I, Burt Knack, am acting as Energy manager for Quillian Quince, MD.  I have reviewed the above documentation for accuracy and completeness, and I agree with the above. -  Quillian Quince, MD

## 2022-04-21 ENCOUNTER — Ambulatory Visit (INDEPENDENT_AMBULATORY_CARE_PROVIDER_SITE_OTHER): Payer: BC Managed Care – PPO | Admitting: Family Medicine

## 2022-04-21 ENCOUNTER — Encounter (INDEPENDENT_AMBULATORY_CARE_PROVIDER_SITE_OTHER): Payer: Self-pay | Admitting: Family Medicine

## 2022-04-21 VITALS — BP 112/71 | HR 82 | Temp 98.0°F | Ht 65.0 in | Wt 257.0 lb

## 2022-04-21 DIAGNOSIS — F3289 Other specified depressive episodes: Secondary | ICD-10-CM | POA: Diagnosis not present

## 2022-04-21 DIAGNOSIS — Z7189 Other specified counseling: Secondary | ICD-10-CM

## 2022-04-21 DIAGNOSIS — Z6841 Body Mass Index (BMI) 40.0 and over, adult: Secondary | ICD-10-CM

## 2022-04-21 DIAGNOSIS — E669 Obesity, unspecified: Secondary | ICD-10-CM

## 2022-04-21 MED ORDER — ESCITALOPRAM OXALATE 20 MG PO TABS: 20.0000 mg | ORAL_TABLET | Freq: Every day | ORAL | 0 refills | Status: DC

## 2022-04-21 MED ORDER — ESCITALOPRAM OXALATE 10 MG PO TABS: 10.0000 mg | ORAL_TABLET | Freq: Every day | ORAL | 0 refills | Status: DC

## 2022-04-28 NOTE — Progress Notes (Signed)
Chief Complaint:   OBESITY Rebecca Dominguez is here to discuss her progress with her obesity treatment plan along with follow-up of her obesity related diagnoses. Rebecca Dominguez is on the Category 3 Plan and states she is following her eating plan approximately 30% of the time. Rebecca Dominguez states she is doing 0 minutes 0 times per week.  Today's visit was #: 85 Starting weight: 338 lbs Starting date: 09/12/2020 Today's weight: 257 lbs Today's date: 04/21/2022 Total lbs lost to date: 81 Total lbs lost since last in-office visit: 0  Interim History: Rebecca Dominguez has really been struggling over the past couple of months with the death of a friend and multiple other stressors.  She notes more stress eating lately, and she feels Lexapro is helping.  Subjective:   1. Other depression with emotional eating Rebecca Dominguez has been struggling over the past couple of months due to a friend's death, loss of family pet, and financial stressors.  She is tolerating Lexapro well with no side effects noted.  2. Counseled about COVID-19 virus infection Rebecca Dominguez has questions about the new his COVID-vaccine and she is thinking she did previous COVID vaccinations with no anaphylactic or other severe reactions.  Assessment/Plan:   1. Other depression with emotional eating Rebecca Dominguez will continue Lexapro 10 mg and 20 mg once daily, and we will refill for 90 days.   - escitalopram (LEXAPRO) 10 MG tablet; Take 1 tablet (10 mg total) by mouth daily.  Dispense: 90 tablet; Refill: 0 - escitalopram (LEXAPRO) 20 MG tablet; Take 1 tablet (20 mg total) by mouth daily.  Dispense: 90 tablet; Refill: 0  2. Counseled about COVID-19 virus infection Rebecca Dominguez was counseled for COVID vaccination today.  She was encouraged to get it as soon as possible and [facility] currently has this available, but not [facility] pharmacies yet.  3. Obesity, Current BMI 42.9 Rebecca Dominguez is currently in the action stage of change. As such, her goal is to continue  with weight loss efforts. She has agreed to the Category 3 Plan.   Support and encouragement was provided.   Exercise goals: As is.   Behavioral modification strategies: increasing lean protein intake, decreasing simple carbohydrates, meal planning and cooking strategies, and keeping healthy foods in the home.  Rebecca Dominguez has agreed to follow-up with our clinic in 4 to 5 weeks. She was informed of the importance of frequent follow-up visits to maximize her success with intensive lifestyle modifications for her multiple health conditions.   Objective:   Blood pressure 112/71, pulse 82, temperature 98 F (36.7 C), height 5\' 5"  (1.651 m), weight 257 lb (116.6 kg), SpO2 96 %. Body mass index is 42.77 kg/m.  General: Cooperative, alert, well developed, in no acute distress. HEENT: Conjunctivae and lids unremarkable. Cardiovascular: Regular rhythm.  Lungs: Normal work of breathing. Neurologic: No focal deficits.   Lab Results  Component Value Date   CREATININE 0.68 10/30/2021   BUN 16 10/30/2021   NA 142 10/30/2021   K 4.7 10/30/2021   CL 106 10/30/2021   CO2 24 10/30/2021   Lab Results  Component Value Date   ALT 31 10/30/2021   AST 30 10/30/2021   ALKPHOS 72 10/30/2021   BILITOT 0.4 10/30/2021   Lab Results  Component Value Date   HGBA1C 5.3 10/30/2021   HGBA1C 5.2 07/02/2021   HGBA1C 5.2 01/28/2021   HGBA1C 5.2 12/27/2019   HGBA1C 5.3 09/12/2018   Lab Results  Component Value Date   INSULIN 5.7 10/30/2021   INSULIN 9.4 07/02/2021  INSULIN 7.8 01/28/2021   INSULIN 7.9 12/27/2019   INSULIN 8.8 09/12/2018   Lab Results  Component Value Date   TSH 2.360 10/30/2021   Lab Results  Component Value Date   CHOL 143 10/30/2021   HDL 69 10/30/2021   LDLCALC 65 10/30/2021   TRIG 38 10/30/2021   CHOLHDL 2.3 01/28/2021   Lab Results  Component Value Date   VD25OH 46.2 10/30/2021   VD25OH 48.8 07/02/2021   VD25OH 50.5 01/28/2021   Lab Results  Component Value  Date   WBC 3.7 10/30/2021   HGB 13.0 10/30/2021   HCT 39.2 10/30/2021   MCV 90 10/30/2021   PLT 191 10/30/2021   Lab Results  Component Value Date   IRON 54 06/05/2020   TIBC 441 06/05/2020   FERRITIN 17 09/12/2018   Attestation Statements:   Reviewed by clinician on day of visit: allergies, medications, problem list, medical history, surgical history, family history, social history, and previous encounter notes.   I, Burt Knack, am acting as transcriptionist for Quillian Quince, MD.  I have reviewed the above documentation for accuracy and completeness, and I agree with the above. -  Quillian Quince, MD

## 2022-05-27 ENCOUNTER — Ambulatory Visit (INDEPENDENT_AMBULATORY_CARE_PROVIDER_SITE_OTHER): Payer: BC Managed Care – PPO | Admitting: Family Medicine

## 2022-05-27 ENCOUNTER — Encounter (INDEPENDENT_AMBULATORY_CARE_PROVIDER_SITE_OTHER): Payer: Self-pay | Admitting: Family Medicine

## 2022-05-27 VITALS — BP 111/70 | HR 65 | Temp 98.3°F | Ht 65.0 in | Wt 255.0 lb

## 2022-05-27 DIAGNOSIS — E669 Obesity, unspecified: Secondary | ICD-10-CM

## 2022-05-27 DIAGNOSIS — E559 Vitamin D deficiency, unspecified: Secondary | ICD-10-CM

## 2022-05-27 DIAGNOSIS — F3289 Other specified depressive episodes: Secondary | ICD-10-CM

## 2022-05-27 DIAGNOSIS — E88819 Insulin resistance, unspecified: Secondary | ICD-10-CM | POA: Diagnosis not present

## 2022-05-27 DIAGNOSIS — Z6841 Body Mass Index (BMI) 40.0 and over, adult: Secondary | ICD-10-CM

## 2022-05-27 MED ORDER — ESCITALOPRAM OXALATE 10 MG PO TABS: 10.0000 mg | ORAL_TABLET | Freq: Every day | ORAL | 0 refills | Status: DC

## 2022-05-27 MED ORDER — ESCITALOPRAM OXALATE 20 MG PO TABS: 20.0000 mg | ORAL_TABLET | Freq: Every day | ORAL | 0 refills | Status: DC

## 2022-05-28 LAB — CBC WITH DIFFERENTIAL/PLATELET
Basophils Absolute: 0 10*3/uL (ref 0.0–0.2)
Basos: 1 %
EOS (ABSOLUTE): 0.1 10*3/uL (ref 0.0–0.4)
Eos: 2 %
Hematocrit: 38.9 % (ref 34.0–46.6)
Hemoglobin: 12.7 g/dL (ref 11.1–15.9)
Immature Grans (Abs): 0 10*3/uL (ref 0.0–0.1)
Immature Granulocytes: 0 %
Lymphocytes Absolute: 1.3 10*3/uL (ref 0.7–3.1)
Lymphs: 33 %
MCH: 29.2 pg (ref 26.6–33.0)
MCHC: 32.6 g/dL (ref 31.5–35.7)
MCV: 89 fL (ref 79–97)
Monocytes Absolute: 0.3 10*3/uL (ref 0.1–0.9)
Monocytes: 7 %
Neutrophils Absolute: 2.4 10*3/uL (ref 1.4–7.0)
Neutrophils: 57 %
Platelets: 208 10*3/uL (ref 150–450)
RBC: 4.35 x10E6/uL (ref 3.77–5.28)
RDW: 13 % (ref 11.7–15.4)
WBC: 4.1 10*3/uL (ref 3.4–10.8)

## 2022-05-28 LAB — LIPID PANEL WITH LDL/HDL RATIO
Cholesterol, Total: 144 mg/dL (ref 100–199)
HDL: 66 mg/dL (ref >39)
LDL Chol Calc (NIH): 69 mg/dL (ref 0–99)
LDL/HDL Ratio: 1 ratio (ref 0.0–3.2)
Triglycerides: 36 mg/dL (ref 0–149)
VLDL Cholesterol Cal: 9 mg/dL (ref 5–40)

## 2022-05-28 LAB — CMP14+EGFR
ALT: 30 IU/L (ref 0–32)
AST: 24 IU/L (ref 0–40)
Albumin/Globulin Ratio: 1.8 (ref 1.2–2.2)
Albumin: 4.1 g/dL (ref 3.9–4.9)
Alkaline Phosphatase: 69 IU/L (ref 44–121)
BUN/Creatinine Ratio: 32 — ABNORMAL HIGH (ref 9–23)
BUN: 18 mg/dL (ref 6–24)
Bilirubin Total: 0.3 mg/dL (ref 0.0–1.2)
CO2: 22 mmol/L (ref 20–29)
Calcium: 9.6 mg/dL (ref 8.7–10.2)
Chloride: 105 mmol/L (ref 96–106)
Creatinine, Ser: 0.57 mg/dL (ref 0.57–1.00)
Globulin, Total: 2.3 g/dL (ref 1.5–4.5)
Glucose: 77 mg/dL (ref 70–99)
Potassium: 5.1 mmol/L (ref 3.5–5.2)
Sodium: 139 mmol/L (ref 134–144)
Total Protein: 6.4 g/dL (ref 6.0–8.5)
eGFR: 113 mL/min/{1.73_m2} (ref >59)

## 2022-05-28 LAB — INSULIN, RANDOM: INSULIN: 5.1 u[IU]/mL (ref 2.6–24.9)

## 2022-05-28 LAB — VITAMIN B12: Vitamin B-12: 296 pg/mL (ref 232–1245)

## 2022-05-28 LAB — HEMOGLOBIN A1C
Est. average glucose Bld gHb Est-mCnc: 105 mg/dL
Hgb A1c MFr Bld: 5.3 % (ref 4.8–5.6)

## 2022-05-28 LAB — VITAMIN D 25 HYDROXY (VIT D DEFICIENCY, FRACTURES): Vit D, 25-Hydroxy: 41.1 ng/mL (ref 30.0–100.0)

## 2022-05-28 LAB — TSH: TSH: 3.38 u[IU]/mL (ref 0.450–4.500)

## 2022-06-09 NOTE — Progress Notes (Signed)
Chief Complaint:   OBESITY Rebecca Dominguez is here to discuss her progress with her obesity treatment plan along with follow-up of her obesity related diagnoses. Rebecca Dominguez is on the Category 3 Plan and states she is following her eating plan approximately 50% of the time. Rebecca Dominguez states she is walking daily.    Today's visit was #: 29 Starting weight: 338 lbs Starting date: 09/12/2020 Today's weight: 255 lbs Today's date: 05/27/2022 Total lbs lost to date: 83 Total lbs lost since last in-office visit: 2  Interim History: Rebecca Dominguez has done well with her weight loss. She has been working to get back on track.   Subjective:   1. Vitamin D deficiency Rebecca Dominguez is not taking Vitamin D currently.   2. Insulin resistance Rebecca Dominguez is not on medications currently.   3. Emotional Eating Behaviors Rebecca Dominguez is taking Lexapro with no side effects noted.   Assessment/Plan:   1. Vitamin D deficiency We will check labs today. Wynelle will follow-up for routine testing of Vitamin D, at least 2-3 times per year to avoid over-replacement.  - VITAMIN D 25 Hydroxy (Vit-D Deficiency, Fractures)  2. Insulin resistance We will check labs today. Latajah will continue her diet and exercise. Rebecca Dominguez agreed to follow-up with [place] as directed to closely monitor her progress.  - Vitamin B12 - CMP14+EGFR - Lipid Panel With LDL/HDL Ratio - Insulin, random - Hemoglobin A1c - CBC with Differential/Platelet - TSH  3. Emotional Eating Behaviors Rebecca Dominguez will continue Lexapro, diet, and exercise and we will refill Lexapro 10 mg and 20 mg for 90 days.   - escitalopram (LEXAPRO) 10 MG tablet; Take 1 tablet (10 mg total) by mouth daily.  Dispense: 90 tablet; Refill: 0 - escitalopram (LEXAPRO) 20 MG tablet; Take 1 tablet (20 mg total) by mouth daily.  Dispense: 90 tablet; Refill: 0  4. Obesity, Current BMI 42.6 Rebecca Dominguez is currently in the action stage of change. As such, her goal is to continue  with weight loss efforts. She has agreed to the Category 3 Plan.   Exercise goals: All adults should avoid inactivity. Some physical activity is better than none, and adults who participate in any amount of physical activity gain some health benefits.  Behavioral modification strategies: increasing lean protein intake, decreasing simple carbohydrates, meal planning and cooking strategies, and holiday eating strategies .  Avalie has agreed to follow-up with our clinic in 4 weeks. She was informed of the importance of frequent follow-up visits to maximize her success with intensive lifestyle modifications for her multiple health conditions.   Rebecca Dominguez was informed we would discuss her lab results at her next visit unless there is a critical issue that needs to be addressed sooner. Rebecca Dominguez agreed to keep her next visit at the agreed upon time to discuss these results.  Objective:   Blood pressure 111/70, pulse 65, temperature 98.3 F (36.8 C), height 5' 5" (1.651 m), weight 255 lb (115.7 kg), SpO2 99 %. Body mass index is 42.43 kg/m.  General: Cooperative, alert, well developed, in no acute distress. HEENT: Conjunctivae and lids unremarkable. Cardiovascular: Regular rhythm.  Lungs: Normal work of breathing. Neurologic: No focal deficits.   Lab Results  Component Value Date   CREATININE 0.57 05/27/2022   BUN 18 05/27/2022   NA 139 05/27/2022   K 5.1 05/27/2022   CL 105 05/27/2022   CO2 22 05/27/2022   Lab Results  Component Value Date   ALT 30 05/27/2022   AST 24 05/27/2022   ALKPHOS 69  05/27/2022   BILITOT 0.3 05/27/2022   Lab Results  Component Value Date   HGBA1C 5.3 05/27/2022   HGBA1C 5.3 10/30/2021   HGBA1C 5.2 07/02/2021   HGBA1C 5.2 01/28/2021   HGBA1C 5.2 12/27/2019   Lab Results  Component Value Date   INSULIN 5.1 05/27/2022   INSULIN 5.7 10/30/2021   INSULIN 9.4 07/02/2021   INSULIN 7.8 01/28/2021   INSULIN 7.9 12/27/2019   Lab Results  Component  Value Date   TSH 3.380 05/27/2022   Lab Results  Component Value Date   CHOL 144 05/27/2022   HDL 66 05/27/2022   LDLCALC 69 05/27/2022   TRIG 36 05/27/2022   CHOLHDL 2.3 01/28/2021   Lab Results  Component Value Date   VD25OH 41.1 05/27/2022   VD25OH 46.2 10/30/2021   VD25OH 48.8 07/02/2021   Lab Results  Component Value Date   WBC 4.1 05/27/2022   HGB 12.7 05/27/2022   HCT 38.9 05/27/2022   MCV 89 05/27/2022   PLT 208 05/27/2022   Lab Results  Component Value Date   IRON 54 06/05/2020   TIBC 441 06/05/2020   FERRITIN 17 09/12/2018   Attestation Statements:   Reviewed by clinician on day of visit: allergies, medications, problem list, medical history, surgical history, family history, social history, and previous encounter notes.   I, Sharon Martin, am acting as transcriptionist for Caren Beasley, MD.  I have reviewed the above documentation for accuracy and completeness, and I agree with the above. -  Caren Beasley, MD   

## 2022-06-29 ENCOUNTER — Ambulatory Visit (INDEPENDENT_AMBULATORY_CARE_PROVIDER_SITE_OTHER): Payer: BC Managed Care – PPO | Admitting: Physician Assistant

## 2022-06-29 ENCOUNTER — Encounter (INDEPENDENT_AMBULATORY_CARE_PROVIDER_SITE_OTHER): Payer: Self-pay | Admitting: Physician Assistant

## 2022-06-29 VITALS — BP 111/69 | HR 74 | Temp 98.2°F | Ht 65.0 in | Wt 256.0 lb

## 2022-06-29 DIAGNOSIS — E538 Deficiency of other specified B group vitamins: Secondary | ICD-10-CM

## 2022-06-29 DIAGNOSIS — F3289 Other specified depressive episodes: Secondary | ICD-10-CM

## 2022-06-29 DIAGNOSIS — E559 Vitamin D deficiency, unspecified: Secondary | ICD-10-CM | POA: Diagnosis not present

## 2022-06-29 DIAGNOSIS — E669 Obesity, unspecified: Secondary | ICD-10-CM

## 2022-06-29 DIAGNOSIS — E88819 Insulin resistance, unspecified: Secondary | ICD-10-CM

## 2022-06-29 DIAGNOSIS — Z6841 Body Mass Index (BMI) 40.0 and over, adult: Secondary | ICD-10-CM

## 2022-06-29 DIAGNOSIS — R5383 Other fatigue: Secondary | ICD-10-CM

## 2022-07-15 NOTE — Progress Notes (Signed)
Chief Complaint:   OBESITY Rebecca Dominguez is here to discuss her progress with her obesity treatment plan along with follow-up of her obesity related diagnoses. [name] is on the Category 3 Plan and states she is following her eating plan approximately 50% of the time. Jaquasha states she is exercising 0 minutes 0 times per week.  Today's visit was #: 51 Starting weight: 338 lbs Starting date: 09/12/2020 Today's weight: 256 lbs Today's date: 06/29/2022 Total lbs lost to date: 82 lbs Total lbs lost since last in-office visit: 0  Interim History: [name] has done well overall with weight loss.  Working to get back on track with prescribed nutrition plan and exercise.  Discussed trying to work in some exercise walking for 10 minutes 3-4 times a week.  Subjective:   1. Insulin resistance Labs discussed during visit today. A1c at 5.3, insulin at 5.1 decreased. Both at goal. Not on medications.  2. Vitamin D deficiency Labs discussed during visit today. Level of 41.1. Not on medication, but takes Womens 1 a day vitamins.  3. Fatigue, unspecified type Labs discussed during visit today. TSH of 3.380--normal. Hgb of 12.7/Hct of 38.9-normal.  4. Vitamin B 12 deficiency Labs discussed during visit today. B12 level of 296 (history of gastrin bypass surgery). Not at goal and may be contributing factor in fatigue.   5. Emotional Eating Behaviors Taking Lexopro--Denies any side effects. Feels it helps.  Assessment/Plan:   1. Insulin resistance Continue healthy eating plan to decrease simple carbohydrates, decrease saturated fats, increase lean protein and exercise to promote weight loss.   2. Vitamin D deficiency Continue over the counter Vit D 1,000 IU daily.  3. Fatigue, unspecified type Continue healthy eating plan to decrease simple carbohydrates, decrease saturated fats, increase lean protein and exercise to promote weight loss.   4. Vitamin B 12 deficiency Start B12 500 mcg  OTC daily. Continue healthy eating plan and exercise.  5. Emotional Eating Behaviors Continue with current Lexapro. Continue healthy eating plan and exercise.  6. Obesity, Current BMI 42.7 [name] is currently in the action stage of change. As such, her goal is to continue with weight loss efforts. She has agreed to the Category 3 Plan.   Exercise goals: All adults should avoid inactivity. Some physical activity is better than none, and adults who participate in any amount of physical activity gain some health benefits.  Behavioral modification strategies: increasing lean protein intake, decreasing simple carbohydrates, meal planning and cooking strategies, and holiday eating strategies .  [name] has agreed to follow-up with our clinic in 4 weeks. She was informed of the importance of frequent follow-up visits to maximize her success with intensive lifestyle modifications for her multiple health conditions.   Objective:   Blood pressure 111/69, pulse 74, temperature 98.2 F (36.8 C), height 5\' 5"  (1.651 m), weight 256 lb (116.1 kg), SpO2 99 %. Body mass index is 42.6 kg/m.  General: Cooperative, alert, well developed, in no acute distress. HEENT: Conjunctivae and lids unremarkable. Cardiovascular: Regular rhythm.  Lungs: Normal work of breathing. Neurologic: No focal deficits.   Lab Results  Component Value Date   CREATININE 0.57 05/27/2022   BUN 18 05/27/2022   NA 139 05/27/2022   K 5.1 05/27/2022   CL 105 05/27/2022   CO2 22 05/27/2022   Lab Results  Component Value Date   ALT 30 05/27/2022   AST 24 05/27/2022   ALKPHOS 69 05/27/2022   BILITOT 0.3 05/27/2022   Lab Results  Component  Value Date   HGBA1C 5.3 05/27/2022   HGBA1C 5.3 10/30/2021   HGBA1C 5.2 07/02/2021   HGBA1C 5.2 01/28/2021   HGBA1C 5.2 12/27/2019   Lab Results  Component Value Date   INSULIN 5.1 05/27/2022   INSULIN 5.7 10/30/2021   INSULIN 9.4 07/02/2021   INSULIN 7.8 01/28/2021    INSULIN 7.9 12/27/2019   Lab Results  Component Value Date   TSH 3.380 05/27/2022   Lab Results  Component Value Date   CHOL 144 05/27/2022   HDL 66 05/27/2022   LDLCALC 69 05/27/2022   TRIG 36 05/27/2022   CHOLHDL 2.3 01/28/2021   Lab Results  Component Value Date   VD25OH 41.1 05/27/2022   VD25OH 46.2 10/30/2021   VD25OH 48.8 07/02/2021   Lab Results  Component Value Date   WBC 4.1 05/27/2022   HGB 12.7 05/27/2022   HCT 38.9 05/27/2022   MCV 89 05/27/2022   PLT 208 05/27/2022   Lab Results  Component Value Date   IRON 54 06/05/2020   TIBC 441 06/05/2020   FERRITIN 17 09/12/2018   Attestation Statements:   Reviewed by clinician on day of visit: allergies, medications, problem list, medical history, surgical history, family history, social history, and previous encounter notes.  I, Brendell Tyus, am acting as transcriptionist for [employer], PA.  I have reviewed the above documentation for accuracy and completeness, and I agree with the above. -  Michaeal Davis,PA-C

## 2022-07-27 ENCOUNTER — Encounter (INDEPENDENT_AMBULATORY_CARE_PROVIDER_SITE_OTHER): Payer: Self-pay | Admitting: Physician Assistant

## 2022-07-27 ENCOUNTER — Ambulatory Visit (INDEPENDENT_AMBULATORY_CARE_PROVIDER_SITE_OTHER): Payer: BC Managed Care – PPO | Admitting: Physician Assistant

## 2022-07-27 VITALS — BP 124/77 | HR 79 | Temp 98.2°F | Ht 65.0 in | Wt 248.0 lb

## 2022-07-27 DIAGNOSIS — R790 Abnormal level of blood mineral: Secondary | ICD-10-CM | POA: Diagnosis not present

## 2022-07-27 DIAGNOSIS — E538 Deficiency of other specified B group vitamins: Secondary | ICD-10-CM | POA: Diagnosis not present

## 2022-07-27 DIAGNOSIS — F3289 Other specified depressive episodes: Secondary | ICD-10-CM

## 2022-07-27 DIAGNOSIS — E669 Obesity, unspecified: Secondary | ICD-10-CM

## 2022-07-27 DIAGNOSIS — Z6841 Body Mass Index (BMI) 40.0 and over, adult: Secondary | ICD-10-CM

## 2022-07-27 MED ORDER — ESCITALOPRAM OXALATE 10 MG PO TABS: 10.0000 mg | ORAL_TABLET | Freq: Every day | ORAL | 0 refills | Status: DC

## 2022-07-27 MED ORDER — CYANOCOBALAMIN 500 MCG PO TABS: 500.0000 ug | ORAL_TABLET | Freq: Every day | ORAL | 2 refills | Status: AC

## 2022-07-27 MED ORDER — ESCITALOPRAM OXALATE 20 MG PO TABS: 20.0000 mg | ORAL_TABLET | Freq: Every day | ORAL | 0 refills | Status: DC

## 2022-08-03 NOTE — Progress Notes (Unsigned)
Chief Complaint:   OBESITY Rebecca Dominguez is here to discuss her progress with her obesity treatment plan along with follow-up of her obesity related diagnoses. Rebecca Dominguez is on the Category 3 Plan and states she is following her eating plan approximately 40% of the time. Rebecca Dominguez states she is not currently exercising.  Today's visit was #: 44 Starting weight: 338 lbs Starting date: 09/12/2020 Today's weight: 248 lbs Today's date: 07/27/2022 Total lbs lost to date: 90 Total lbs lost since last in-office visit: 8  Interim History: Rebecca Dominguez has done well with weight loss. She is having some mild cold symptoms causing decreased appetite.  Using protein shakes to meet protein needs at times. She reports work is generally going well and less stressful for the most part.   Subjective:   1. Low ferritin level Rebecca Dominguez was started on ferrous sulfate 325 mg daily per [facility] for a low ferritin level. Reports no side effects.   2. Vitamin B 12 deficiency B12 level 296- low normal range. Pt is taking B12 supplementation with no side effects.  3. Emotional Eating Behaviors Rebecca Dominguez Lexapro 30 mg daily with no side effects. Mood stable. Rebecca Dominguez denies suicidal or homicidal ideations.  Assessment/Plan:   1. Low ferritin level Continue taking ferrous sulfate 325 mg daily. Repeat iron/ferritin level in 2-3 months.  2. Vitamin B 12 deficiency Continue B12 supplement. Continue prudent nutritional plan which is a B12 rich diet. Recheck levels in 2-3 months.  Refill- cyanocobalamin (VITAMIN B12) 500 MCG tablet; Take 1 tablet (500 mcg total) by mouth daily.  Dispense: 30 tablet; Refill: 2  3. Emotional Eating Behaviors Continue Lexapro to help decrease cravings and emotional eating. Emotional eating behavior strategies were discussed. Continue to follow response to Lexapro.   Refill- escitalopram (LEXAPRO) 10 MG tablet; Take 1 tablet (10 mg total) by mouth daily.  Dispense: 90 tablet; Refill:  0 Refill- escitalopram (LEXAPRO) 20 MG tablet; Take 1 tablet (20 mg total) by mouth daily.  Dispense: 90 tablet; Refill: 0  4. Obesity, Current BMI 41.3 Rebecca Dominguez is currently in the action stage of change. As such, her goal is to continue with weight loss efforts. She has agreed to the Category 3 Plan.   Exercise goals:  Try to increase movement during the day.  Behavioral modification strategies: increasing lean protein intake, decreasing simple carbohydrates, and no skipping meals.  Rebecca Dominguez has agreed to follow-up with our clinic in 6 weeks. She was informed of the importance of frequent follow-up visits to maximize her success with intensive lifestyle modifications for her multiple health conditions.   Objective:   Blood pressure 124/77, pulse 79, temperature 98.2 F (36.8 C), height 5\' 5"  (1.651 m), weight 248 lb (112.5 kg), SpO2 97 %. Body mass index is 41.27 kg/m.  General: Cooperative, alert, well developed, in no acute distress. HEENT: Conjunctivae and lids unremarkable. Cardiovascular: Regular rhythm.  Lungs: Normal work of breathing. Neurologic: No focal deficits.   Lab Results  Component Value Date   CREATININE 0.57 05/27/2022   BUN 18 05/27/2022   NA 139 05/27/2022   K 5.1 05/27/2022   CL 105 05/27/2022   CO2 22 05/27/2022   Lab Results  Component Value Date   ALT 30 05/27/2022   AST 24 05/27/2022   ALKPHOS 69 05/27/2022   BILITOT 0.3 05/27/2022   Lab Results  Component Value Date   HGBA1C 5.3 05/27/2022   HGBA1C 5.3 10/30/2021   HGBA1C 5.2 07/02/2021   HGBA1C 5.2 01/28/2021   HGBA1C  5.2 12/27/2019   Lab Results  Component Value Date   INSULIN 5.1 05/27/2022   INSULIN 5.7 10/30/2021   INSULIN 9.4 07/02/2021   INSULIN 7.8 01/28/2021   INSULIN 7.9 12/27/2019   Lab Results  Component Value Date   TSH 3.380 05/27/2022   Lab Results  Component Value Date   CHOL 144 05/27/2022   HDL 66 05/27/2022   LDLCALC 69 05/27/2022   TRIG 36 05/27/2022    CHOLHDL 2.3 01/28/2021   Lab Results  Component Value Date   VD25OH 41.1 05/27/2022   VD25OH 46.2 10/30/2021   VD25OH 48.8 07/02/2021   Lab Results  Component Value Date   WBC 4.1 05/27/2022   HGB 12.7 05/27/2022   HCT 38.9 05/27/2022   MCV 89 05/27/2022   PLT 208 05/27/2022   Lab Results  Component Value Date   IRON 54 06/05/2020   TIBC 441 06/05/2020   FERRITIN 17 09/12/2018    Attestation Statements:   Reviewed by clinician on day of visit: allergies, medications, problem list, medical history, surgical history, family history, social history, and previous encounter notes.  I, Rebecca Dominguez, BS, CMA, am acting as transcriptionist for [employer] [place], PA-C.  I have reviewed the above documentation for accuracy and completeness, and I agree with the above. -  RAYBURN,SHAWN,PA-C

## 2022-09-15 ENCOUNTER — Encounter (INDEPENDENT_AMBULATORY_CARE_PROVIDER_SITE_OTHER): Payer: Self-pay | Admitting: Family Medicine

## 2022-09-15 ENCOUNTER — Ambulatory Visit (INDEPENDENT_AMBULATORY_CARE_PROVIDER_SITE_OTHER): Payer: BC Managed Care – PPO | Admitting: Family Medicine

## 2022-09-15 VITALS — BP 107/56 | HR 67 | Temp 98.4°F | Ht 65.0 in | Wt 257.0 lb

## 2022-09-15 DIAGNOSIS — R601 Generalized edema: Secondary | ICD-10-CM | POA: Diagnosis not present

## 2022-09-15 DIAGNOSIS — Z6841 Body Mass Index (BMI) 40.0 and over, adult: Secondary | ICD-10-CM

## 2022-09-15 DIAGNOSIS — E669 Obesity, unspecified: Secondary | ICD-10-CM

## 2022-09-22 ENCOUNTER — Other Ambulatory Visit: Payer: Self-pay | Admitting: Family Medicine

## 2022-09-22 DIAGNOSIS — Z1231 Encounter for screening mammogram for malignant neoplasm of breast: Secondary | ICD-10-CM

## 2022-10-05 NOTE — Progress Notes (Signed)
Chief Complaint:   OBESITY Rebecca Dominguez is here to discuss her progress with her obesity treatment plan along with follow-up of her obesity related diagnoses. Rebecca Dominguez is on the Category 3 Plan and states she is following her eating plan approximately 40% of the time. Rebecca Dominguez states she is doing 0 minutes 0 times per week.  Today's visit was #: 47 Starting weight: 338 lbs Starting date: 09/12/2018 Today's weight: 257 lbs Today's date: 09/15/2022 Total lbs lost to date: 81 Total lbs lost since last in-office visit: 0  Interim History: Rebecca Dominguez has had some big fluid shifts.  Her husband has to eat more salt and this has likely affected her as well.  She tries to increase her protein in her diet, but her levels may have decreased recently.  Subjective:   1. Generalized edema Rebecca Dominguez is retaining fluid today.  She notes fluctuations 5 to 10 pounds in a week.  She notes swelling more in her hands and feet.  She denies increase in shortness of breath or orthopnea.  Assessment/Plan:   1. Generalized edema Rebecca Dominguez is to work on decreasing sodium and simple carbohydrates.  We will continue to monitor for worsening symptoms.  2. BMI 40.0-44.9, adult ([place])  3. Obesity, Beginning BMI 56.25 Rebecca Dominguez is currently in the action stage of change. As such, her goal is to continue with weight loss efforts. She has agreed to keeping a food journal and adhering to recommended goals of 1500 calories and 100+ grams of protein daily.   Behavioral modification strategies: increasing lean protein intake, increasing water intake, and decreasing sodium intake.  Rebecca Dominguez has agreed to follow-up with our clinic in 3 to 4 weeks. She was informed of the importance of frequent follow-up visits to maximize her success with intensive lifestyle modifications for her multiple health conditions.   Objective:   Blood pressure (!) 107/56, pulse 67, temperature 98.4 F (36.9 C), height 5\' 5"  (1.651 m),  weight 257 lb (116.6 kg), SpO2 95 %. Body mass index is 42.77 kg/m.  Lab Results  Component Value Date   CREATININE 0.57 05/27/2022   BUN 18 05/27/2022   NA 139 05/27/2022   K 5.1 05/27/2022   CL 105 05/27/2022   CO2 22 05/27/2022   Lab Results  Component Value Date   ALT 30 05/27/2022   AST 24 05/27/2022   ALKPHOS 69 05/27/2022   BILITOT 0.3 05/27/2022   Lab Results  Component Value Date   HGBA1C 5.3 05/27/2022   HGBA1C 5.3 10/30/2021   HGBA1C 5.2 07/02/2021   HGBA1C 5.2 01/28/2021   HGBA1C 5.2 12/27/2019   Lab Results  Component Value Date   INSULIN 5.1 05/27/2022   INSULIN 5.7 10/30/2021   INSULIN 9.4 07/02/2021   INSULIN 7.8 01/28/2021   INSULIN 7.9 12/27/2019   Lab Results  Component Value Date   TSH 3.380 05/27/2022   Lab Results  Component Value Date   CHOL 144 05/27/2022   HDL 66 05/27/2022   LDLCALC 69 05/27/2022   TRIG 36 05/27/2022   CHOLHDL 2.3 01/28/2021   Lab Results  Component Value Date   VD25OH 41.1 05/27/2022   VD25OH 46.2 10/30/2021   VD25OH 48.8 07/02/2021   Lab Results  Component Value Date   WBC 4.1 05/27/2022   HGB 12.7 05/27/2022   HCT 38.9 05/27/2022   MCV 89 05/27/2022   PLT 208 05/27/2022   Lab Results  Component Value Date   IRON 54 06/05/2020   TIBC 441 06/05/2020  FERRITIN 17 09/12/2018   Attestation Statements:   Reviewed by clinician on day of visit: allergies, medications, problem list, medical history, surgical history, family history, social history, and previous encounter notes.  Time spent on visit including pre-visit chart review and post-visit care and charting was 30 minutes.   I, Trixie Dredge, am acting as transcriptionist for Dennard Nip, MD.  I have reviewed the above documentation for accuracy and completeness, and I agree with the above. -  Dennard Nip, MD

## 2022-10-15 ENCOUNTER — Encounter (INDEPENDENT_AMBULATORY_CARE_PROVIDER_SITE_OTHER): Payer: Self-pay | Admitting: Family Medicine

## 2022-10-15 ENCOUNTER — Ambulatory Visit (INDEPENDENT_AMBULATORY_CARE_PROVIDER_SITE_OTHER): Payer: BC Managed Care – PPO | Admitting: Family Medicine

## 2022-10-15 VITALS — BP 123/72 | HR 75 | Temp 98.4°F | Ht 65.0 in | Wt 256.0 lb

## 2022-10-15 DIAGNOSIS — E669 Obesity, unspecified: Secondary | ICD-10-CM

## 2022-10-15 DIAGNOSIS — Z6841 Body Mass Index (BMI) 40.0 and over, adult: Secondary | ICD-10-CM

## 2022-10-15 DIAGNOSIS — F3289 Other specified depressive episodes: Secondary | ICD-10-CM | POA: Diagnosis not present

## 2022-10-15 MED ORDER — ESCITALOPRAM OXALATE 20 MG PO TABS: 20.0000 mg | ORAL_TABLET | Freq: Every day | ORAL | 0 refills | Status: DC

## 2022-10-15 MED ORDER — ESCITALOPRAM OXALATE 10 MG PO TABS: 10.0000 mg | ORAL_TABLET | Freq: Every day | ORAL | 0 refills | Status: DC

## 2022-10-19 NOTE — Progress Notes (Unsigned)
Chief Complaint:   OBESITY Rebecca Dominguez is here to discuss her progress with her obesity treatment plan along with follow-up of her obesity related diagnoses. Rebecca Dominguez is on keeping a food journal and adhering to recommended goals of 1500 calories and 100+ grams of protein and states she is following her eating plan approximately 40% of the time. Rebecca Dominguez states she is doing 0 minutes 0 times per week.  Today's visit was #: 41 Starting weight: 338 lbs Starting date: 09/12/2018 Today's weight: 256 lbs Today's date: 10/15/2022 Total lbs lost to date: 82 Total lbs lost since last in-office visit: 1  Interim History: Rebecca Dominguez continues to work on her weight loss. She notes struggling with meal planning at times. She is still mindful of her food options and protein intake. She will be going on a cruise soon and she anticipates that she will do well.   Subjective:   1. Emotional Eating Behaviors Rebecca Dominguez is doing well with decreasing emotional eating behavior.  She is stable on 30 mg of Lexapro with no side effects noted.  Assessment/Plan:   1. Emotional Eating Behaviors Rebecca Dominguez will continue Lexapro, and we will refill Lexapro at 10 mg and Lexapro at 20 mg daily for 90 days.  - escitalopram (LEXAPRO) 20 MG tablet; Take 1 tablet (20 mg total) by mouth daily.  Dispense: 90 tablet; Refill: 0 - escitalopram (LEXAPRO) 10 MG tablet; Take 1 tablet (10 mg total) by mouth daily.  Dispense: 90 tablet; Refill: 0  2. BMI 40.0-44.9, adult (Rebecca Dominguez)  3. Obesity, Beginning BMI 56.25 Rebecca Dominguez is currently in the action stage of change. As such, her goal is to continue with weight loss efforts. She has agreed to keeping a food journal and adhering to recommended goals of 1500 calories and 100+ grams of protein daily.   Behavioral modification strategies: increasing lean protein intake and travel eating strategies.  Rebecca Dominguez has agreed to follow-up with our clinic in 4 weeks. She was informed of  the importance of frequent follow-up visits to maximize her success with intensive lifestyle modifications for her multiple health conditions.   Objective:   Blood pressure 123/72, pulse 75, temperature 98.4 F (36.9 C), height 5\' 5"  (1.651 m), weight 256 lb (116.1 kg), SpO2 96 %. Body mass index is 42.6 kg/m.  Lab Results  Component Value Date   CREATININE 0.57 05/27/2022   BUN 18 05/27/2022   NA 139 05/27/2022   K 5.1 05/27/2022   CL 105 05/27/2022   CO2 22 05/27/2022   Lab Results  Component Value Date   ALT 30 05/27/2022   AST 24 05/27/2022   ALKPHOS 69 05/27/2022   BILITOT 0.3 05/27/2022   Lab Results  Component Value Date   HGBA1C 5.3 05/27/2022   HGBA1C 5.3 10/30/2021   HGBA1C 5.2 07/02/2021   HGBA1C 5.2 01/28/2021   HGBA1C 5.2 12/27/2019   Lab Results  Component Value Date   INSULIN 5.1 05/27/2022   INSULIN 5.7 10/30/2021   INSULIN 9.4 07/02/2021   INSULIN 7.8 01/28/2021   INSULIN 7.9 12/27/2019   Lab Results  Component Value Date   TSH 3.380 05/27/2022   Lab Results  Component Value Date   CHOL 144 05/27/2022   HDL 66 05/27/2022   LDLCALC 69 05/27/2022   TRIG 36 05/27/2022   CHOLHDL 2.3 01/28/2021   Lab Results  Component Value Date   VD25OH 41.1 05/27/2022   VD25OH 46.2 10/30/2021   VD25OH 48.8 07/02/2021   Lab Results  Component Value  Date   WBC 4.1 05/27/2022   HGB 12.7 05/27/2022   HCT 38.9 05/27/2022   MCV 89 05/27/2022   PLT 208 05/27/2022   Lab Results  Component Value Date   IRON 54 06/05/2020   TIBC 441 06/05/2020   FERRITIN 17 09/12/2018   Attestation Statements:   Reviewed by clinician on day of visit: allergies, medications, problem list, medical history, surgical history, family history, social history, and previous encounter notes.  Time spent on visit including pre-visit chart review and post-visit care and charting was 30 minutes.   I, Rebecca Dominguez, am acting as transcriptionist for Dennard Nip, MD.  I have  reviewed the above documentation for accuracy and completeness, and I agree with the above. -  Dennard Nip, MD

## 2022-11-09 ENCOUNTER — Ambulatory Visit
Admission: RE | Admit: 2022-11-09 | Discharge: 2022-11-09 | Disposition: A | Discharge status: Home / Self Care | Payer: BC Managed Care – PPO | Source: Ambulatory Visit | Attending: Family Medicine | Admitting: Family Medicine

## 2022-11-09 DIAGNOSIS — Z1231 Encounter for screening mammogram for malignant neoplasm of breast: Secondary | ICD-10-CM

## 2022-11-19 ENCOUNTER — Encounter (INDEPENDENT_AMBULATORY_CARE_PROVIDER_SITE_OTHER): Payer: Self-pay | Admitting: Family Medicine

## 2022-11-19 ENCOUNTER — Ambulatory Visit (INDEPENDENT_AMBULATORY_CARE_PROVIDER_SITE_OTHER): Payer: BC Managed Care – PPO | Admitting: Family Medicine

## 2022-11-19 VITALS — BP 116/71 | HR 66 | Temp 97.8°F | Ht 65.0 in | Wt 257.0 lb

## 2022-11-19 DIAGNOSIS — E538 Deficiency of other specified B group vitamins: Secondary | ICD-10-CM | POA: Diagnosis not present

## 2022-11-19 DIAGNOSIS — E88819 Insulin resistance, unspecified: Secondary | ICD-10-CM

## 2022-11-19 DIAGNOSIS — E669 Obesity, unspecified: Secondary | ICD-10-CM | POA: Diagnosis not present

## 2022-11-19 DIAGNOSIS — Z6841 Body Mass Index (BMI) 40.0 and over, adult: Secondary | ICD-10-CM

## 2022-11-19 DIAGNOSIS — E559 Vitamin D deficiency, unspecified: Secondary | ICD-10-CM | POA: Diagnosis not present

## 2022-11-20 LAB — LIPID PANEL WITH LDL/HDL RATIO
Cholesterol, Total: 155 mg/dL (ref 100–199)
HDL: 73 mg/dL (ref >39)
LDL Chol Calc (NIH): 73 mg/dL (ref 0–99)
LDL/HDL Ratio: 1 ratio (ref 0.0–3.2)
Triglycerides: 36 mg/dL (ref 0–149)
VLDL Cholesterol Cal: 9 mg/dL (ref 5–40)

## 2022-11-20 LAB — CMP14+EGFR
ALT: 32 IU/L (ref 0–32)
AST: 28 IU/L (ref 0–40)
Albumin/Globulin Ratio: 1.7 (ref 1.2–2.2)
Albumin: 4.1 g/dL (ref 3.9–4.9)
Alkaline Phosphatase: 72 IU/L (ref 44–121)
BUN/Creatinine Ratio: 27 — ABNORMAL HIGH (ref 9–23)
BUN: 18 mg/dL (ref 6–24)
Bilirubin Total: 0.3 mg/dL (ref 0.0–1.2)
CO2: 18 mmol/L — ABNORMAL LOW (ref 20–29)
Calcium: 10 mg/dL (ref 8.7–10.2)
Chloride: 105 mmol/L (ref 96–106)
Creatinine, Ser: 0.66 mg/dL (ref 0.57–1.00)
Globulin, Total: 2.4 g/dL (ref 1.5–4.5)
Glucose: 83 mg/dL (ref 70–99)
Potassium: 4.3 mmol/L (ref 3.5–5.2)
Sodium: 143 mmol/L (ref 134–144)
Total Protein: 6.5 g/dL (ref 6.0–8.5)
eGFR: 109 mL/min/{1.73_m2} (ref >59)

## 2022-11-20 LAB — CBC WITH DIFFERENTIAL/PLATELET
Basophils Absolute: 0 10*3/uL (ref 0.0–0.2)
Basos: 1 %
EOS (ABSOLUTE): 0.1 10*3/uL (ref 0.0–0.4)
Eos: 2 %
Hematocrit: 42.7 % (ref 34.0–46.6)
Hemoglobin: 13.5 g/dL (ref 11.1–15.9)
Immature Grans (Abs): 0 10*3/uL (ref 0.0–0.1)
Immature Granulocytes: 0 %
Lymphocytes Absolute: 1.4 10*3/uL (ref 0.7–3.1)
Lymphs: 39 %
MCH: 29.8 pg (ref 26.6–33.0)
MCHC: 31.6 g/dL (ref 31.5–35.7)
MCV: 94 fL (ref 79–97)
Monocytes Absolute: 0.3 10*3/uL (ref 0.1–0.9)
Monocytes: 10 %
Neutrophils Absolute: 1.7 10*3/uL (ref 1.4–7.0)
Neutrophils: 48 %
Platelets: 204 10*3/uL (ref 150–450)
RBC: 4.53 x10E6/uL (ref 3.77–5.28)
RDW: 13.3 % (ref 11.7–15.4)
WBC: 3.5 10*3/uL (ref 3.4–10.8)

## 2022-11-20 LAB — VITAMIN B12: Vitamin B-12: 528 pg/mL (ref 232–1245)

## 2022-11-20 LAB — TSH: TSH: 2.21 u[IU]/mL (ref 0.450–4.500)

## 2022-11-20 LAB — HEMOGLOBIN A1C
Est. average glucose Bld gHb Est-mCnc: 105 mg/dL
Hgb A1c MFr Bld: 5.3 % (ref 4.8–5.6)

## 2022-11-20 LAB — VITAMIN D 25 HYDROXY (VIT D DEFICIENCY, FRACTURES): Vit D, 25-Hydroxy: 36.3 ng/mL (ref 30.0–100.0)

## 2022-11-20 LAB — INSULIN, RANDOM: INSULIN: 5.9 u[IU]/mL (ref 2.6–24.9)

## 2022-11-23 NOTE — Progress Notes (Signed)
Chief Complaint:   OBESITY Rebecca Dominguez is here to discuss her progress with her obesity treatment plan along with follow-up of her obesity related diagnoses. Rebecca Dominguez is on keeping a food journal and adhering to recommended goals of 1500 calories and 100+ grams of protein and states she is following her eating plan approximately 50% of the time. Rebecca Dominguez states she is doing 0 minutes 0 times per week.  Today's visit was #: 55 Starting weight: 338 lbs Starting date: 09/12/2018 Today's weight: 257 lbs Today's date: 11/19/2022 Total lbs lost to date: 81 Total lbs lost since last in-office visit: 0  Interim History: Rebecca Dominguez continues to work on maintaining her weight. She is going on an 8 day cruise. She is working on increasing her water intake.   Subjective:   1. Vitamin D deficiency Rebecca Dominguez's last Vitamin D level was below 50, and is not at goal.   2. Vitamin B 12 deficiency Rebecca Dominguez has had B12 deficiency, but she is getting increased B12 in her diet and B12 supplement.   3. Insulin resistance Rebecca Dominguez has been well controlled with her diet.   Assessment/Plan:   1. Vitamin D deficiency We will check labs today, and Bayli will continue with her multivitamins.   - VITAMIN D 25 Hydroxy (Vit-D Deficiency, Fractures) - TSH  2. Vitamin B 12 deficiency We will check labs today, and we will continue to follow.   - Vitamin B12 - CBC with Differential/Platelet  3. Insulin resistance We will check labs today, and we will follow-up at her next visit. Rebecca Dominguez will continue with decreasing simple carbohydrates.   - CMP14+EGFR - Lipid Panel With LDL/HDL Ratio - Insulin, random - Hemoglobin A1c  4. BMI 40.0-44.9, adult ([facility])  5. Obesity, Beginning BMI 56.25 Rebecca Dominguez is currently in the action stage of change. As such, her goal is to continue with weight loss efforts. She has agreed to keeping a food journal and adhering to recommended goals of 1500 calories and  100 grams of protein daily.   Behavioral modification strategies: increasing lean protein intake, increasing water intake, and travel eating strategies.  Rebecca Dominguez has agreed to follow-up with our clinic in 4 weeks. She was informed of the importance of frequent follow-up visits to maximize her success with intensive lifestyle modifications for her multiple health conditions.   Rebecca Dominguez was informed we would discuss her lab results at her next visit unless there is a critical issue that needs to be addressed sooner. Rebecca Dominguez agreed to keep her next visit at the agreed upon time to discuss these results.  Objective:   Blood pressure 116/71, pulse 66, temperature 97.8 F (36.6 C), height 5\' 5"  (1.651 m), weight 257 lb (116.6 kg), SpO2 96 %. Body mass index is 42.77 kg/m.  Lab Results  Component Value Date   CREATININE 0.66 11/19/2022   BUN 18 11/19/2022   NA 143 11/19/2022   K 4.3 11/19/2022   CL 105 11/19/2022   CO2 18 (L) 11/19/2022   Lab Results  Component Value Date   ALT 32 11/19/2022   AST 28 11/19/2022   ALKPHOS 72 11/19/2022   BILITOT 0.3 11/19/2022   Lab Results  Component Value Date   HGBA1C 5.3 11/19/2022   HGBA1C 5.3 05/27/2022   HGBA1C 5.3 10/30/2021   HGBA1C 5.2 07/02/2021   HGBA1C 5.2 01/28/2021   Lab Results  Component Value Date   INSULIN 5.9 11/19/2022   INSULIN 5.1 05/27/2022   INSULIN 5.7 10/30/2021   INSULIN 9.4 07/02/2021  INSULIN 7.8 01/28/2021   Lab Results  Component Value Date   TSH 2.210 11/19/2022   Lab Results  Component Value Date   CHOL 155 11/19/2022   HDL 73 11/19/2022   LDLCALC 73 11/19/2022   TRIG 36 11/19/2022   CHOLHDL 2.3 01/28/2021   Lab Results  Component Value Date   VD25OH 36.3 11/19/2022   VD25OH 41.1 05/27/2022   VD25OH 46.2 10/30/2021   Lab Results  Component Value Date   WBC 3.5 11/19/2022   HGB 13.5 11/19/2022   HCT 42.7 11/19/2022   MCV 94 11/19/2022   PLT 204 11/19/2022   Lab Results  Component  Value Date   IRON 54 06/05/2020   TIBC 441 06/05/2020   FERRITIN 17 09/12/2018   Attestation Statements:   Reviewed by clinician on day of visit: allergies, medications, problem list, medical history, surgical history, family history, social history, and previous encounter notes.  I have personally spent 40 minutes total time today in preparation, patient care, and documentation for this visit, including the following: review of clinical lab tests; review of medical tests/procedures/services.   I, Burt Knack, am acting as transcriptionist for Quillian Quince, MD.  I have reviewed the above documentation for accuracy and completeness, and I agree with the above. -  Quillian Quince, MD

## 2022-12-22 ENCOUNTER — Encounter (INDEPENDENT_AMBULATORY_CARE_PROVIDER_SITE_OTHER): Payer: Self-pay | Admitting: Family Medicine

## 2022-12-22 ENCOUNTER — Ambulatory Visit (INDEPENDENT_AMBULATORY_CARE_PROVIDER_SITE_OTHER): Payer: BC Managed Care – PPO | Admitting: Family Medicine

## 2022-12-22 VITALS — BP 116/73 | HR 65 | Temp 98.1°F | Ht 65.0 in | Wt 263.0 lb

## 2022-12-22 DIAGNOSIS — E669 Obesity, unspecified: Secondary | ICD-10-CM

## 2022-12-22 DIAGNOSIS — R600 Localized edema: Secondary | ICD-10-CM | POA: Diagnosis not present

## 2022-12-22 DIAGNOSIS — F439 Reaction to severe stress, unspecified: Secondary | ICD-10-CM

## 2022-12-22 DIAGNOSIS — E559 Vitamin D deficiency, unspecified: Secondary | ICD-10-CM

## 2022-12-22 DIAGNOSIS — R7989 Other specified abnormal findings of blood chemistry: Secondary | ICD-10-CM | POA: Insufficient documentation

## 2022-12-22 DIAGNOSIS — F3289 Other specified depressive episodes: Secondary | ICD-10-CM

## 2022-12-22 DIAGNOSIS — Z6841 Body Mass Index (BMI) 40.0 and over, adult: Secondary | ICD-10-CM

## 2022-12-22 NOTE — Progress Notes (Unsigned)
[facility]  Office: [phone]  /  Fax: [phone]  WEIGHT SUMMARY AND BIOMETRICS  Anthropometric Measurements Height: 5\' 5"  (1.651 m) Weight: 263 lb (119.3 kg) BMI (Calculated): 43.77 Weight at Last Visit: 257lb Weight Lost Since Last Visit: 0lb Weight Gained Since Last Visit: 6lb   Body Composition  Body Fat %: 55 % Fat Mass (lbs): 145 lbs Muscle Mass (lbs): 112.6 lbs Visceral Fat Rating : 17   Other Clinical Data Fasting: no Labs: no Today's Visit #: 56    Chief Complaint: OBESITY  Rebecca Dominguez is here to discuss her progress with her obesity treatment plan. She is on the the Category 3 Plan and states she is following her eating plan approximately 50 % of the time.   Discussed the use of AI scribe software for clinical note transcription with the patient, who gave verbal consent to proceed.  History of Present Illness   The patient, with a history of obesity, vitamin D deficiency, depression, and bilateral lower extremity edema, recently returned from a cruise vacation. She reported significant foot swelling during the trip, which resolved a few days after returning home. The patient also noted changes in bowel habits during the trip, which required additional fiber supplementation. She attributed the foot swelling to the consumption of tap water on the ship, which she believes contained residual salt despite filtration.  The patient has been managing stress related to home renovations and workplace changes. She reported some difficulty adhering to her meal plan, particularly during dinner, due to exhaustion and lack of time. However, she has strategies in place, such as meal prepping and freezing meals for future use.  The patient also reported interrupted sleep due to the need to care for an aging pet. She expressed excitement about the upcoming addition of a new puppy to her household, but also acknowledged the potential for increased stress and further sleep  disruption.  Despite these challenges, the patient expressed a desire to get back on track with her meal plan and improve her health. She acknowledged the need for increased willpower and adherence to her category 3 meal plan. She also noted the importance of hydration and maintaining a structured routine for meals, particularly breakfast and lunch.  The patient's recent lab results showed improvement in hydration, good cholesterol, triglycerides, and LDL levels. The patient is also on a regimen of Lexapro for depression, which she reported as effective with no side effects.          PHYSICAL EXAM:  Blood pressure 116/73, pulse 65, temperature 98.1 F (36.7 C), height 5\' 5"  (1.651 m), weight 263 lb (119.3 kg), SpO2 99 %. Body mass index is 43.77 kg/m.  DIAGNOSTIC DATA REVIEWED:  BMET    Component Value Date/Time   NA 143 11/19/2022 0842   K 4.3 11/19/2022 0842   CL 105 11/19/2022 0842   CO2 18 (L) 11/19/2022 0842   GLUCOSE 83 11/19/2022 0842   BUN 18 11/19/2022 0842   CREATININE 0.66 11/19/2022 0842   CALCIUM 10.0 11/19/2022 0842   GFRNONAA 100 12/27/2019 1150   GFRAA 115 12/27/2019 1150   Lab Results  Component Value Date   HGBA1C 5.3 11/19/2022   HGBA1C 5.3 09/12/2018   Lab Results  Component Value Date   INSULIN 5.9 11/19/2022   INSULIN 8.8 09/12/2018   Lab Results  Component Value Date   TSH 2.210 11/19/2022   CBC    Component Value Date/Time   WBC 3.5 11/19/2022 0842   RBC 4.53 11/19/2022 0842  HGB 13.5 11/19/2022 0842   HCT 42.7 11/19/2022 0842   PLT 204 11/19/2022 0842   MCV 94 11/19/2022 0842   MCH 29.8 11/19/2022 0842   MCHC 31.6 11/19/2022 0842   RDW 13.3 11/19/2022 0842   Iron Studies    Component Value Date/Time   IRON 54 06/05/2020 0000   TIBC 441 06/05/2020 0000   FERRITIN 17 09/12/2018 0957   IRONPCTSAT 12 06/05/2020 0000   Lipid Panel     Component Value Date/Time   CHOL 155 11/19/2022 0842   TRIG 36 11/19/2022 0842   HDL 73  11/19/2022 0842   CHOLHDL 2.3 01/28/2021 1115   LDLCALC 73 11/19/2022 0842   Hepatic Function Panel     Component Value Date/Time   PROT 6.5 11/19/2022 0842   ALBUMIN 4.1 11/19/2022 0842   AST 28 11/19/2022 0842   ALT 32 11/19/2022 0842   ALKPHOS 72 11/19/2022 0842   BILITOT 0.3 11/19/2022 0842      Component Value Date/Time   TSH 2.210 11/19/2022 0842   Nutritional Lab Results  Component Value Date   VD25OH 36.3 11/19/2022   VD25OH 41.1 05/27/2022   VD25OH 46.2 10/30/2021     Assessment and Plan    Obesity: Recent weight gain of 6 pounds. Discussed the impact of recent travel and home stressors on dietary habits. Encouraged continued focus on protein and vegetable intake, and avoidance of simple carbohydrates. -Continue meal planning and prepping. -Review progress at next appointment.  Vitamin D Deficiency: Recent labs showed a decrease in Vitamin D levels, currently in the 30s. Patient is not currently taking a Vitamin D supplement outside of a multivitamin. She notes some fatigue. -Start prescription Vitamin D supplement once a week. -Recheck Vitamin D levels at next lab draw.  Lower Extremity Edema: Patient experienced significant lower extremity swelling during recent travel. Swelling resolved upon return home. -Continue hydration and regular exercise. -No additional intervention needed at this time.  Depression with emotional eating behaviors: Patient is currently on Lexapro 20mg  and 10mg  with no reported side effects. -Continue current medication regimen. -Refill prescription at next appointment.         I have personally spent 40 minutes total time today in preparation, patient care, and documentation for this visit, including the following: review of clinical lab tests; review of medical tests/procedures/services.    She was informed of the importance of frequent follow up visits to maximize her success with intensive lifestyle modifications for her multiple  health conditions. No follow-ups on file.   Quillian Quince, MD

## 2023-01-20 ENCOUNTER — Encounter (INDEPENDENT_AMBULATORY_CARE_PROVIDER_SITE_OTHER): Payer: Self-pay | Admitting: Family Medicine

## 2023-01-20 ENCOUNTER — Ambulatory Visit (INDEPENDENT_AMBULATORY_CARE_PROVIDER_SITE_OTHER): Payer: BC Managed Care – PPO | Admitting: Family Medicine

## 2023-01-20 VITALS — BP 105/68 | HR 63 | Temp 98.1°F | Ht 65.0 in | Wt 261.0 lb

## 2023-01-20 DIAGNOSIS — R4689 Other symptoms and signs involving appearance and behavior: Secondary | ICD-10-CM

## 2023-01-20 DIAGNOSIS — Z6841 Body Mass Index (BMI) 40.0 and over, adult: Secondary | ICD-10-CM

## 2023-01-20 DIAGNOSIS — F3289 Other specified depressive episodes: Secondary | ICD-10-CM

## 2023-01-20 DIAGNOSIS — E669 Obesity, unspecified: Secondary | ICD-10-CM

## 2023-01-20 MED ORDER — ESCITALOPRAM OXALATE 10 MG PO TABS: 10.0000 mg | ORAL_TABLET | Freq: Every day | ORAL | 0 refills | Status: DC

## 2023-01-20 MED ORDER — ESCITALOPRAM OXALATE 20 MG PO TABS: 20.0000 mg | ORAL_TABLET | Freq: Every day | ORAL | 0 refills | Status: DC

## 2023-01-20 NOTE — Progress Notes (Signed)
Chief Complaint:   OBESITY Rebecca Dominguez is here to discuss her progress with her obesity treatment plan along with follow-up of her obesity related diagnoses. Rebecca Dominguez is on keeping a food journal and adhering to recommended goals of 1500 calories and 100 grams of protein and states she is following her eating plan approximately 50% of the time. Rebecca Dominguez states she is doing 0 minutes 0 times per week.  Today's visit was #: 57 Starting weight: 338 lbs Starting date: 09/12/2018 Today's weight: 261 lbs Today's date: 01/20/2023 Total lbs lost to date: 77 Total lbs lost since last in-office visit: 2  Interim History: Patient is doing well with her weight loss despite having extra challenges and stress.  She is working on following her category 3 plan and she is staying mindful of her food choices.  Subjective:   1. Emotional Eating Behaviors Patient has increased stress especially with family health issues, which she is dealing with.  She continues to work on decreasing emotional eating behavior.  Assessment/Plan:   1. Emotional Eating Behaviors We will refill Lexapro 10 mg and Lexapro 20 mg for 90 days.  Stress coping mechanisms were discussed with the patient including setting boundaries.  - escitalopram (LEXAPRO) 10 MG tablet; Take 1 tablet (10 mg total) by mouth daily.  Dispense: 90 tablet; Refill: 0 - escitalopram (LEXAPRO) 20 MG tablet; Take 1 tablet (20 mg total) by mouth daily.  Dispense: 90 tablet; Refill: 0  2. Obesity, with starting BMI 56.25  3. BMI 40.0-44.9, adult [facility]) Rebecca Dominguez is currently in the action stage of change. As such, her goal is to continue with weight loss efforts. She has agreed to the Category 3 Plan.   Behavioral modification strategies: increasing lean protein intake.  Rebecca Dominguez has agreed to follow-up with our clinic in 4 weeks. She was informed of the importance of frequent follow-up visits to maximize her success with intensive lifestyle  modifications for her multiple health conditions.   Objective:   Blood pressure 105/68, pulse 63, temperature 98.1 F (36.7 C), height 5\' 5"  (1.651 m), weight 261 lb (118.4 kg), SpO2 99 %. Body mass index is 43.43 kg/m.  Lab Results  Component Value Date   CREATININE 0.66 11/19/2022   BUN 18 11/19/2022   NA 143 11/19/2022   K 4.3 11/19/2022   CL 105 11/19/2022   CO2 18 (L) 11/19/2022   Lab Results  Component Value Date   ALT 32 11/19/2022   AST 28 11/19/2022   ALKPHOS 72 11/19/2022   BILITOT 0.3 11/19/2022   Lab Results  Component Value Date   HGBA1C 5.3 11/19/2022   HGBA1C 5.3 05/27/2022   HGBA1C 5.3 10/30/2021   HGBA1C 5.2 07/02/2021   HGBA1C 5.2 01/28/2021   Lab Results  Component Value Date   INSULIN 5.9 11/19/2022   INSULIN 5.1 05/27/2022   INSULIN 5.7 10/30/2021   INSULIN 9.4 07/02/2021   INSULIN 7.8 01/28/2021   Lab Results  Component Value Date   TSH 2.210 11/19/2022   Lab Results  Component Value Date   CHOL 155 11/19/2022   HDL 73 11/19/2022   LDLCALC 73 11/19/2022   TRIG 36 11/19/2022   CHOLHDL 2.3 01/28/2021   Lab Results  Component Value Date   VD25OH 36.3 11/19/2022   VD25OH 41.1 05/27/2022   VD25OH 46.2 10/30/2021   Lab Results  Component Value Date   WBC 3.5 11/19/2022   HGB 13.5 11/19/2022   HCT 42.7 11/19/2022   MCV 94 11/19/2022  PLT 204 11/19/2022   Lab Results  Component Value Date   IRON 54 06/05/2020   TIBC 441 06/05/2020   FERRITIN 17 09/12/2018   Attestation Statements:   Reviewed by clinician on day of visit: allergies, medications, problem list, medical history, surgical history, family history, social history, and previous encounter notes.  Time spent on visit including pre-visit chart review and post-visit care and charting was 43 minutes.   I, Burt Knack, am acting as transcriptionist for Quillian Quince, MD.  I have reviewed the above documentation for accuracy and completeness, and I agree with the  above. -  Quillian Quince, MD

## 2023-02-24 ENCOUNTER — Encounter (INDEPENDENT_AMBULATORY_CARE_PROVIDER_SITE_OTHER): Payer: Self-pay | Admitting: Family Medicine

## 2023-02-24 ENCOUNTER — Ambulatory Visit (INDEPENDENT_AMBULATORY_CARE_PROVIDER_SITE_OTHER): Payer: BC Managed Care – PPO | Admitting: Family Medicine

## 2023-02-24 VITALS — BP 114/71 | HR 64 | Temp 98.0°F | Ht 65.0 in | Wt 260.0 lb

## 2023-02-24 DIAGNOSIS — E669 Obesity, unspecified: Secondary | ICD-10-CM

## 2023-02-24 DIAGNOSIS — F3289 Other specified depressive episodes: Secondary | ICD-10-CM

## 2023-02-24 DIAGNOSIS — Z6841 Body Mass Index (BMI) 40.0 and over, adult: Secondary | ICD-10-CM | POA: Diagnosis not present

## 2023-02-24 DIAGNOSIS — F32A Depression, unspecified: Secondary | ICD-10-CM

## 2023-02-24 DIAGNOSIS — N951 Menopausal and female climacteric states: Secondary | ICD-10-CM

## 2023-02-24 NOTE — Progress Notes (Signed)
.smr  Office: [phone]  /  Fax: [phone]  WEIGHT SUMMARY AND BIOMETRICS  Anthropometric Measurements Height: 5\' 5"  (1.651 m) Weight: 260 lb (117.9 kg) BMI (Calculated): 43.27 Weight at Last Visit: 261 lb Weight Lost Since Last Visit: 1 lb Weight Gained Since Last Visit: 0 Starting Weight: 338 lb Total Weight Loss (lbs): 78 lb (35.4 kg)   Body Composition  Body Fat %: 55.8 % Fat Mass (lbs): 145.4 lbs Muscle Mass (lbs): 109.4 lbs Visceral Fat Rating : 17   Other Clinical Data Fasting: Yes Labs: No Today's Visit #: 16 Starting Date: 09/12/18    Chief Complaint: OBESITY     History of Present Illness   Rebecca Dominguez, a 47 year old individual with a history of obesity and depression, presents today for a follow-up visit. She reports a weight loss of one pound over the past month, attributing this to adherence to a category three diet plan about 50% of the time. She acknowledges not currently engaging in any regular exercise.  She describes a recent increase in stress and fatigue due to multiple factors. She recently brought home a new puppy, which has disrupted her sleep as she has been sleeping on the couch to monitor the puppy. Additionally, she has an older dog that requires frequent nighttime care. Her husband's health issues, including orthostatic hypotension, have also added to her stress, as she has had to take over yard work, which she finds physically challenging.  Rebecca Dominguez notes some swelling in her feet, which she attributes to recent consumption of salty foods and inadequate hydration. She expresses a desire to increase her water intake. She has established a routine of eating the same breakfast daily, which includes a protein shake, yogurt, string cheese, and a glass of milk. She also packs her lunch for work, typically consisting of leftovers from dinner the previous night.  She reports no changes in her medication regimen, which includes Lexapro for  depression. She has not noticed any adverse effects from this medication. She also mentions some recent skin issues, including breakouts along the jawline, which she suspects may be hormonally driven. She is currently using an IUD for contraception and has noticed some cyclical symptoms such as headaches and bloating, despite not having menstrual bleeding.          PHYSICAL EXAM:  Blood pressure 114/71, pulse 64, temperature 98 F (36.7 C), height 5\' 5"  (1.651 m), weight 260 lb (117.9 kg), SpO2 98%. Body mass index is 43.27 kg/m.  DIAGNOSTIC DATA REVIEWED:  BMET    Component Value Date/Time   NA 143 11/19/2022 0842   K 4.3 11/19/2022 0842   CL 105 11/19/2022 0842   CO2 18 (L) 11/19/2022 0842   GLUCOSE 83 11/19/2022 0842   BUN 18 11/19/2022 0842   CREATININE 0.66 11/19/2022 0842   CALCIUM 10.0 11/19/2022 0842   GFRNONAA 100 12/27/2019 1150   GFRAA 115 12/27/2019 1150   Lab Results  Component Value Date   HGBA1C 5.3 11/19/2022   HGBA1C 5.3 09/12/2018   Lab Results  Component Value Date   INSULIN 5.9 11/19/2022   INSULIN 8.8 09/12/2018   Lab Results  Component Value Date   TSH 2.210 11/19/2022   CBC    Component Value Date/Time   WBC 3.5 11/19/2022 0842   RBC 4.53 11/19/2022 0842   HGB 13.5 11/19/2022 0842   HCT 42.7 11/19/2022 0842   PLT 204 11/19/2022 0842   MCV 94 11/19/2022 0842   MCH 29.8 11/19/2022 0842   MCHC  31.6 11/19/2022 0842   RDW 13.3 11/19/2022 0842   Iron Studies    Component Value Date/Time   IRON 54 06/05/2020 0000   TIBC 441 06/05/2020 0000   FERRITIN 17 09/12/2018 0957   IRONPCTSAT 12 06/05/2020 0000   Lipid Panel     Component Value Date/Time   CHOL 155 11/19/2022 0842   TRIG 36 11/19/2022 0842   HDL 73 11/19/2022 0842   CHOLHDL 2.3 01/28/2021 1115   LDLCALC 73 11/19/2022 0842   Hepatic Function Panel     Component Value Date/Time   PROT 6.5 11/19/2022 0842   ALBUMIN 4.1 11/19/2022 0842   AST 28 11/19/2022 0842   ALT 32  11/19/2022 0842   ALKPHOS 72 11/19/2022 0842   BILITOT 0.3 11/19/2022 0842      Component Value Date/Time   TSH 2.210 11/19/2022 0842   Nutritional Lab Results  Component Value Date   VD25OH 36.3 11/19/2022   VD25OH 41.1 05/27/2022   VD25OH 46.2 10/30/2021     Assessment and Plan    Obesity Slow progress with weight loss (1 pound in the last month). Adherence to category three plan is 50%. No current exercise regimen. Increased physical activity due to new puppy and yard work. Emotional eating behaviors related to stress and exhaustion. -Continue category three plan. -Increase hydration. -Consider incorporating more structured exercise into routine.  Depression Currently on Lexapro. No reported issues with medication. Increased stress and exhaustion due to new puppy and husband's health issues. -Continue Lexapro as prescribed. -Consider strategies for stress management and improved sleep.  Perimenopausal symptoms Reports hormonal acne and premenstrual symptoms (bloating, headaches). Currently has Mirena IUD. -Consider consultation with OB/GYN for management of perimenopausal symptoms.  Follow-up in 1 month. Plan to have fasting labs at next visit.       She was informed of the importance of frequent follow up visits to maximize her success with intensive lifestyle modifications for her multiple health conditions.    Quillian Quince, MD

## 2023-03-31 ENCOUNTER — Ambulatory Visit (INDEPENDENT_AMBULATORY_CARE_PROVIDER_SITE_OTHER): Payer: BC Managed Care – PPO | Admitting: Family Medicine

## 2023-03-31 ENCOUNTER — Encounter (INDEPENDENT_AMBULATORY_CARE_PROVIDER_SITE_OTHER): Payer: Self-pay | Admitting: Family Medicine

## 2023-03-31 VITALS — BP 112/72 | HR 68 | Temp 98.1°F | Ht 65.0 in | Wt 268.0 lb

## 2023-03-31 DIAGNOSIS — Z6841 Body Mass Index (BMI) 40.0 and over, adult: Secondary | ICD-10-CM

## 2023-03-31 DIAGNOSIS — E88819 Insulin resistance, unspecified: Secondary | ICD-10-CM

## 2023-03-31 DIAGNOSIS — E559 Vitamin D deficiency, unspecified: Secondary | ICD-10-CM | POA: Diagnosis not present

## 2023-03-31 DIAGNOSIS — E538 Deficiency of other specified B group vitamins: Secondary | ICD-10-CM

## 2023-03-31 DIAGNOSIS — F5089 Other specified eating disorder: Secondary | ICD-10-CM

## 2023-03-31 DIAGNOSIS — K5909 Other constipation: Secondary | ICD-10-CM

## 2023-03-31 DIAGNOSIS — E669 Obesity, unspecified: Secondary | ICD-10-CM

## 2023-03-31 DIAGNOSIS — F3289 Other specified depressive episodes: Secondary | ICD-10-CM

## 2023-03-31 MED ORDER — ESCITALOPRAM OXALATE 20 MG PO TABS
20.0000 mg | ORAL_TABLET | Freq: Every day | ORAL | 0 refills | Status: DC
Start: 2023-03-31 — End: 2023-06-01

## 2023-03-31 MED ORDER — ESCITALOPRAM OXALATE 10 MG PO TABS
10.0000 mg | ORAL_TABLET | Freq: Every day | ORAL | 0 refills | Status: DC
Start: 2023-03-31 — End: 2023-06-01

## 2023-04-01 LAB — CMP14+EGFR
ALT: 32 IU/L (ref 0–32)
AST: 28 IU/L (ref 0–40)
Albumin: 3.9 g/dL (ref 3.9–4.9)
Alkaline Phosphatase: 65 IU/L (ref 44–121)
BUN/Creatinine Ratio: 25 — ABNORMAL HIGH (ref 9–23)
BUN: 15 mg/dL (ref 6–24)
Bilirubin Total: 0.4 mg/dL (ref 0.0–1.2)
CO2: 25 mmol/L (ref 20–29)
Calcium: 9.3 mg/dL (ref 8.7–10.2)
Chloride: 105 mmol/L (ref 96–106)
Creatinine, Ser: 0.59 mg/dL (ref 0.57–1.00)
Globulin, Total: 2.1 g/dL (ref 1.5–4.5)
Glucose: 78 mg/dL (ref 70–99)
Potassium: 4.3 mmol/L (ref 3.5–5.2)
Sodium: 141 mmol/L (ref 134–144)
Total Protein: 6 g/dL (ref 6.0–8.5)
eGFR: 112 mL/min/{1.73_m2} (ref >59)

## 2023-04-01 LAB — HEMOGLOBIN A1C
Est. average glucose Bld gHb Est-mCnc: 105 mg/dL
Hgb A1c MFr Bld: 5.3 % (ref 4.8–5.6)

## 2023-04-01 LAB — INSULIN, RANDOM: INSULIN: 5.5 u[IU]/mL (ref 2.6–24.9)

## 2023-04-01 LAB — VITAMIN B12: Vitamin B-12: 534 pg/mL (ref 232–1245)

## 2023-04-01 LAB — VITAMIN D 25 HYDROXY (VIT D DEFICIENCY, FRACTURES): Vit D, 25-Hydroxy: 53.4 ng/mL (ref 30.0–100.0)

## 2023-04-07 NOTE — Progress Notes (Unsigned)
Chief Complaint:   OBESITY Rebecca Dominguez is here to discuss her progress with her obesity treatment plan along with follow-up of her obesity related diagnoses. Rebecca Dominguez is on the Category 3 Plan and states she is following her eating plan approximately 40% of the time. Rebecca Dominguez states she is active with yard work.    Today's visit was #: 59 Starting weight: 338 lbs Starting date: 09/12/2018 Today's weight: 268 lbs Today's date: 03/31/2023 Total lbs lost to date: 70 Total lbs lost since last in-office visit: 0  Interim History: Patient notes some increase in snacking especially on salty foods and not always eating the food on her plan.  She is making more substitutions and she has been ordering out more.  Subjective:   1. Vitamin D deficiency Patient is on vitamin D, and she is due for labs.  2. Vitamin B 12 deficiency Patient is on a B12 rich diet, and she is due for labs.  3. Insulin resistance Patient is working on her diet, but she is struggling more with increased simple carbohydrates.  She is due for labs.  4. Other constipation Patient uses fiber Gummies, but she notes constipation is worse in the last few days.  5. Emotional Eating Behaviors with increased stress Patient notes increased stress with her husband and her older pets.  Assessment/Plan:   1. Vitamin D deficiency We will check labs today, and we will follow-up at patient's next visit in 1 month.  - VITAMIN D 25 Hydroxy (Vit-D Deficiency, Fractures)  2. Vitamin B 12 deficiency We will check labs today, and we will follow-up at patient's next visit in 1 month.  - Vitamin B12  3. Insulin resistance We will check labs today, and we will follow-up at patient's next visit in 1 month.  - CMP14+EGFR - Insulin, random - Hemoglobin A1c  4. Other constipation Patient is to continue to increase her water intake and she is okay to take MiraLAX as needed.  5. Emotional Eating Behaviors with increased  stress Patient will continue her medications, and we will refill Lexapro 20 mg and Lexapro 10 mg for 90 days.  - escitalopram (LEXAPRO) 20 MG tablet; Take 1 tablet (20 mg total) by mouth daily.  Dispense: 90 tablet; Refill: 0 - escitalopram (LEXAPRO) 10 MG tablet; Take 1 tablet (10 mg total) by mouth daily.  Dispense: 90 tablet; Refill: 0  6. BMI 40.0-44.9, adult (HCC)  7. Obesity, Beginning BMI 56.25 Rebecca Dominguez is currently in the action stage of change. As such, her goal is to continue with weight loss efforts. She has agreed to the Category 3 Plan.   Behavioral modification strategies: decreasing sodium intake and decreasing eating out.  Rebecca Dominguez has agreed to follow-up with our clinic in 4 weeks. She was informed of the importance of frequent follow-up visits to maximize her success with intensive lifestyle modifications for her multiple health conditions.   Rebecca Dominguez was informed we would discuss her lab results at her next visit unless there is a critical issue that needs to be addressed sooner. Rebecca Dominguez agreed to keep her next visit at the agreed upon time to discuss these results.  Objective:   Blood pressure 112/72, pulse 68, temperature 98.1 F (36.7 C), height 5\' 5"  (1.651 m), weight 268 lb (121.6 kg), SpO2 97%. Body mass index is 44.6 kg/m.  Lab Results  Component Value Date   CREATININE 0.59 03/31/2023   BUN 15 03/31/2023   NA 141 03/31/2023   K 4.3 03/31/2023  CL 105 03/31/2023   CO2 25 03/31/2023   Lab Results  Component Value Date   ALT 32 03/31/2023   AST 28 03/31/2023   ALKPHOS 65 03/31/2023   BILITOT 0.4 03/31/2023   Lab Results  Component Value Date   HGBA1C 5.3 03/31/2023   HGBA1C 5.3 11/19/2022   HGBA1C 5.3 05/27/2022   HGBA1C 5.3 10/30/2021   HGBA1C 5.2 07/02/2021   Lab Results  Component Value Date   INSULIN 5.5 03/31/2023   INSULIN 5.9 11/19/2022   INSULIN 5.1 05/27/2022   INSULIN 5.7 10/30/2021   INSULIN 9.4 07/02/2021   Lab Results   Component Value Date   TSH 2.210 11/19/2022   Lab Results  Component Value Date   CHOL 155 11/19/2022   HDL 73 11/19/2022   LDLCALC 73 11/19/2022   TRIG 36 11/19/2022   CHOLHDL 2.3 01/28/2021   Lab Results  Component Value Date   VD25OH 53.4 03/31/2023   VD25OH 36.3 11/19/2022   VD25OH 41.1 05/27/2022   Lab Results  Component Value Date   WBC 3.5 11/19/2022   HGB 13.5 11/19/2022   HCT 42.7 11/19/2022   MCV 94 11/19/2022   PLT 204 11/19/2022   Lab Results  Component Value Date   IRON 54 06/05/2020   TIBC 441 06/05/2020   FERRITIN 17 09/12/2018   Attestation Statements:   Reviewed by clinician on day of visit: allergies, medications, problem list, medical history, surgical history, family history, social history, and previous encounter notes.   I, Burt Knack, am acting as transcriptionist for Quillian Quince, MD.  I have reviewed the above documentation for accuracy and completeness, and I agree with the above. -  Quillian Quince, MD

## 2023-05-04 ENCOUNTER — Ambulatory Visit (INDEPENDENT_AMBULATORY_CARE_PROVIDER_SITE_OTHER): Payer: BC Managed Care – PPO | Admitting: Family Medicine

## 2023-05-04 ENCOUNTER — Encounter (INDEPENDENT_AMBULATORY_CARE_PROVIDER_SITE_OTHER): Payer: Self-pay | Admitting: Family Medicine

## 2023-05-04 VITALS — BP 119/76 | HR 74 | Temp 98.4°F | Ht 65.0 in | Wt 267.0 lb

## 2023-05-04 DIAGNOSIS — E669 Obesity, unspecified: Secondary | ICD-10-CM | POA: Diagnosis not present

## 2023-05-04 DIAGNOSIS — Z6841 Body Mass Index (BMI) 40.0 and over, adult: Secondary | ICD-10-CM | POA: Diagnosis not present

## 2023-05-04 DIAGNOSIS — F3289 Other specified depressive episodes: Secondary | ICD-10-CM

## 2023-05-04 DIAGNOSIS — F5089 Other specified eating disorder: Secondary | ICD-10-CM

## 2023-05-04 NOTE — Progress Notes (Signed)
Chief Complaint:   OBESITY Rebecca Dominguez is here to discuss her progress with her obesity treatment plan along with follow-up of her obesity related diagnoses. Rebecca Dominguez is on the Category 3 Plan and states she is following her eating plan approximately 50% of the time. Rebecca Dominguez states she is walking for 30-40 minutes 2 times per week.  Today's visit was #: 60 Starting weight: 338 lbs Starting date: 09/12/2018 Today's weight: 267 lbs Today's date: 05/04/2023 Total lbs lost to date: 71 Total lbs lost since last in-office visit: 1  Interim History: Patient is still dealing with multiple stressors, but she has been able to be mindful of her diet and continue to walk for exercise.  Subjective:   1. Emotional Eating Behaviors with increased stress Patient is stable on Lexapro.  She has multiple significant stressors with loved ones.  She is tearful in the office today, but she appears to be grieving appropriately.  She is mindful of her emotional eating behavior.  Assessment/Plan:   1. Emotional Eating Behaviors with increased stress Patient was encouraged to avoid feeling guilty when she has some dietary indiscretions and to work on getting back on track with her eating.  She was offered support and encouragement, and we will continue to follow closely.  She will continue her Lexapro as is.  2. BMI 40.0-44.9, adult (HCC)  3. Obesity, Beginning BMI 56.25 Rebecca Dominguez is currently in the action stage of change. As such, her goal is to continue with weight loss efforts. She has agreed to the Category 3 Plan.   Exercise goals: As is.   Behavioral modification strategies: emotional eating strategies.  Rebecca Dominguez has agreed to follow-up with our clinic in 4 weeks. She was informed of the importance of frequent follow-up visits to maximize her success with intensive lifestyle modifications for her multiple health conditions.   Objective:   Blood pressure 119/76, pulse 74, temperature 98.4  F (36.9 C), height 5\' 5"  (1.651 m), weight 267 lb (121.1 kg), SpO2 98%. Body mass index is 44.43 kg/m.  Lab Results  Component Value Date   CREATININE 0.59 03/31/2023   BUN 15 03/31/2023   NA 141 03/31/2023   K 4.3 03/31/2023   CL 105 03/31/2023   CO2 25 03/31/2023   Lab Results  Component Value Date   ALT 32 03/31/2023   AST 28 03/31/2023   ALKPHOS 65 03/31/2023   BILITOT 0.4 03/31/2023   Lab Results  Component Value Date   HGBA1C 5.3 03/31/2023   HGBA1C 5.3 11/19/2022   HGBA1C 5.3 05/27/2022   HGBA1C 5.3 10/30/2021   HGBA1C 5.2 07/02/2021   Lab Results  Component Value Date   INSULIN 5.5 03/31/2023   INSULIN 5.9 11/19/2022   INSULIN 5.1 05/27/2022   INSULIN 5.7 10/30/2021   INSULIN 9.4 07/02/2021   Lab Results  Component Value Date   TSH 2.210 11/19/2022   Lab Results  Component Value Date   CHOL 155 11/19/2022   HDL 73 11/19/2022   LDLCALC 73 11/19/2022   TRIG 36 11/19/2022   CHOLHDL 2.3 01/28/2021   Lab Results  Component Value Date   VD25OH 53.4 03/31/2023   VD25OH 36.3 11/19/2022   VD25OH 41.1 05/27/2022   Lab Results  Component Value Date   WBC 3.5 11/19/2022   HGB 13.5 11/19/2022   HCT 42.7 11/19/2022   MCV 94 11/19/2022   PLT 204 11/19/2022   Lab Results  Component Value Date   IRON 54 06/05/2020   TIBC 441  06/05/2020   FERRITIN 17 09/12/2018   Attestation Statements:   Reviewed by clinician on day of visit: allergies, medications, problem list, medical history, surgical history, family history, social history, and previous encounter notes.  Time spent on visit including pre-visit chart review and post-visit care and charting was 30 minutes.   I, Burt Knack, am acting as transcriptionist for Quillian Quince, MD.  I have reviewed the above documentation for accuracy and completeness, and I agree with the above. -  Quillian Quince, MD

## 2023-06-01 ENCOUNTER — Encounter (INDEPENDENT_AMBULATORY_CARE_PROVIDER_SITE_OTHER): Payer: Self-pay | Admitting: Family Medicine

## 2023-06-01 ENCOUNTER — Ambulatory Visit (INDEPENDENT_AMBULATORY_CARE_PROVIDER_SITE_OTHER): Payer: BC Managed Care – PPO | Admitting: Family Medicine

## 2023-06-01 VITALS — BP 113/57 | HR 68 | Temp 98.1°F | Ht 65.0 in | Wt 266.0 lb

## 2023-06-01 DIAGNOSIS — F5089 Other specified eating disorder: Secondary | ICD-10-CM | POA: Diagnosis not present

## 2023-06-01 DIAGNOSIS — Z6841 Body Mass Index (BMI) 40.0 and over, adult: Secondary | ICD-10-CM

## 2023-06-01 DIAGNOSIS — Z639 Problem related to primary support group, unspecified: Secondary | ICD-10-CM | POA: Insufficient documentation

## 2023-06-01 DIAGNOSIS — E669 Obesity, unspecified: Secondary | ICD-10-CM

## 2023-06-01 DIAGNOSIS — F3289 Other specified depressive episodes: Secondary | ICD-10-CM

## 2023-06-01 MED ORDER — ESCITALOPRAM OXALATE 10 MG PO TABS
10.0000 mg | ORAL_TABLET | Freq: Every day | ORAL | 0 refills | Status: DC
Start: 2023-06-01 — End: 2023-10-18

## 2023-06-01 MED ORDER — ESCITALOPRAM OXALATE 20 MG PO TABS
20.0000 mg | ORAL_TABLET | Freq: Every day | ORAL | 0 refills | Status: DC
Start: 2023-06-01 — End: 2023-10-18

## 2023-06-01 NOTE — Progress Notes (Signed)
.smr  Office: [phone]  /  Fax: [phone]  WEIGHT SUMMARY AND BIOMETRICS  Anthropometric Measurements Height: 5\' 5"  (1.651 m) Weight: 266 lb (120.7 kg) BMI (Calculated): 44.26 Weight at Last Visit: 267 lb Weight Lost Since Last Visit: 1 lb Weight Gained Since Last Visit: 0 Starting Weight: 338 lb Total Weight Loss (lbs): 72 lb (32.7 kg)   Body Composition  Body Fat %: 54.5 % Fat Mass (lbs): 145 lbs Muscle Mass (lbs): 115 lbs Visceral Fat Rating : 17   Other Clinical Data Fasting: Yes Labs: No Today's Visit #: 56 Starting Date: 09/12/18    Chief Complaint: OBESITY   History of Present Illness   The patient, a 47 year old with a history of obesity and depression, presents with ongoing emotional eating behaviors and stress. She reports a weight loss of one pound since the last visit a month ago and adherence to her category three eating plan about 50% of the time. She has not been exercising recently. The patient is currently on Lexapro 30mg  daily.  The patient has been experiencing significant family and personal stress. She recently had to euthanize her elderly dog due to declining quality of life, which has been emotionally challenging. Additionally, she is grieving the loss of a close friend's father, who recently passed away. The patient also expresses significant distress and anxiety related to the recent election results, which has strained family relationships and led to feelings of isolation.  The patient's work environment has also been stressful due to a coworker's prolonged hospitalization. She expresses frustration with the coworker's family's lack of understanding and advocacy for the coworker's medical care.  Despite these stressors, the patient has managed to maintain her weight, which she monitors daily using a biometric scale. She expresses a desire to improve her physical activity levels, possibly through walking her dogs. However, she has not yet  implemented this plan.          PHYSICAL EXAM:  Blood pressure (!) 113/57, pulse 68, temperature 98.1 F (36.7 C), height 5\' 5"  (1.651 m), weight 266 lb (120.7 kg), SpO2 99%. Body mass index is 44.26 kg/m.  DIAGNOSTIC DATA REVIEWED:  BMET    Component Value Date/Time   NA 141 03/31/2023 0829   K 4.3 03/31/2023 0829   CL 105 03/31/2023 0829   CO2 25 03/31/2023 0829   GLUCOSE 78 03/31/2023 0829   BUN 15 03/31/2023 0829   CREATININE 0.59 03/31/2023 0829   CALCIUM 9.3 03/31/2023 0829   GFRNONAA 100 12/27/2019 1150   GFRAA 115 12/27/2019 1150   Lab Results  Component Value Date   HGBA1C 5.3 03/31/2023   HGBA1C 5.3 09/12/2018   Lab Results  Component Value Date   INSULIN 5.5 03/31/2023   INSULIN 8.8 09/12/2018   Lab Results  Component Value Date   TSH 2.210 11/19/2022   CBC    Component Value Date/Time   WBC 3.5 11/19/2022 0842   RBC 4.53 11/19/2022 0842   HGB 13.5 11/19/2022 0842   HCT 42.7 11/19/2022 0842   PLT 204 11/19/2022 0842   MCV 94 11/19/2022 0842   MCH 29.8 11/19/2022 0842   MCHC 31.6 11/19/2022 0842   RDW 13.3 11/19/2022 0842   Iron Studies    Component Value Date/Time   IRON 54 06/05/2020 0000   TIBC 441 06/05/2020 0000   FERRITIN 17 09/12/2018 0957   IRONPCTSAT 12 06/05/2020 0000   Lipid Panel     Component Value Date/Time   CHOL 155 11/19/2022 0842  TRIG 36 11/19/2022 0842   HDL 73 11/19/2022 0842   CHOLHDL 2.3 01/28/2021 1115   LDLCALC 73 11/19/2022 0842   Hepatic Function Panel     Component Value Date/Time   PROT 6.0 03/31/2023 0829   ALBUMIN 3.9 03/31/2023 0829   AST 28 03/31/2023 0829   ALT 32 03/31/2023 0829   ALKPHOS 65 03/31/2023 0829   BILITOT 0.4 03/31/2023 0829      Component Value Date/Time   TSH 2.210 11/19/2022 0842   Nutritional Lab Results  Component Value Date   VD25OH 53.4 03/31/2023   VD25OH 36.3 11/19/2022   VD25OH 41.1 05/27/2022     Assessment and Plan    Obesity   Obesity with emotional  eating behaviors. Lost one pound in the last month. Following category three eating plan 50% of the time. Not exercising recently. Discussed benefits of regular exercise and adherence to the eating plan for weight management and overall health.   - Encourage adherence to the eating plan   - Recommend resuming regular exercise, such as walking with her dogs    Depression with emotional eating behaviors and family dysfunction Depression exacerbated by recent family stress, loss of a pet, and political anxiety. Currently on Lexapro 30 mg daily. Discussed potential benefits of counseling for family dynamics and emotional stress. Emphasized importance of maintaining mental health through counseling and medication adherence.   - Continue Lexapro 30 mg daily   - Encourage considering counseling for additional support    General Health Maintenance   Emphasized importance of protein intake and regular physical activity for overall health, weight management, and mental health.   - Ensure adequate protein intake   - Encourage regular physical activity, such as walking.         I have personally spent 40 minutes total time today in preparation, patient care, and documentation for this visit, including the following: review of clinical lab tests; review of medical tests/procedures/services.    She was informed of the importance of frequent follow up visits to maximize her success with intensive lifestyle modifications for her multiple health conditions.    Quillian Quince, MD

## 2023-07-01 ENCOUNTER — Encounter (INDEPENDENT_AMBULATORY_CARE_PROVIDER_SITE_OTHER): Payer: Self-pay | Admitting: Family Medicine

## 2023-07-01 ENCOUNTER — Ambulatory Visit (INDEPENDENT_AMBULATORY_CARE_PROVIDER_SITE_OTHER): Payer: BC Managed Care – PPO | Admitting: Family Medicine

## 2023-07-01 VITALS — BP 127/84 | HR 76 | Temp 98.4°F | Ht 65.0 in | Wt 268.0 lb

## 2023-07-01 DIAGNOSIS — F5089 Other specified eating disorder: Secondary | ICD-10-CM

## 2023-07-01 DIAGNOSIS — F32A Depression, unspecified: Secondary | ICD-10-CM | POA: Diagnosis not present

## 2023-07-01 DIAGNOSIS — Z6841 Body Mass Index (BMI) 40.0 and over, adult: Secondary | ICD-10-CM

## 2023-07-01 DIAGNOSIS — E669 Obesity, unspecified: Secondary | ICD-10-CM | POA: Diagnosis not present

## 2023-07-01 DIAGNOSIS — F3289 Other specified depressive episodes: Secondary | ICD-10-CM

## 2023-07-01 NOTE — Progress Notes (Signed)
[facility]  Office: [phone]  /  Fax: [phone]  WEIGHT SUMMARY AND BIOMETRICS  Anthropometric Measurements Height: 5\' 5"  (1.651 m) Weight: 268 lb (121.6 kg) BMI (Calculated): 44.6 Weight at Last Visit: 266 lb Weight Lost Since Last Visit: 0 Weight Gained Since Last Visit: 2 lb Starting Weight: 338 lb Total Weight Loss (lbs): 70 lb (31.8 kg)   Body Composition  Body Fat %: 54.9 % Fat Mass (lbs): 147.6 lbs Muscle Mass (lbs): 115 lbs Visceral Fat Rating : 18   Other Clinical Data Fasting: No Labs: No Today's Visit #: 40 Starting Date: 09/12/18    Chief Complaint: OBESITY   History of Present Illness   The patient, currently on Lexapro 30mg  for depression, presents today with concerns about emotional eating behaviors and obesity. She reports a weight gain of two pounds over the past month, despite following a category three eating plan 50% of the time. She is not currently engaging in any exercise.  The patient is experiencing high levels of stress due to multiple factors. She reports a busy period at work and a friend who has been in the hospital for three months, causing worry and concern. The patient also mentions the stress of her husband's health issues, which have led to frequent work absences and financial strain. The husband is considering leaving his job and filing for disability, which would potentially leave the patient as the sole breadwinner, adding to her stress.  Despite these challenges, the patient reports that the Lexapro is helping most days. She expresses a desire for a break from her current situation, suggesting a need for stress management strategies. The patient also mentions a planned vacation for her fifteenth anniversary next year, indicating a positive future event to look forward to.  Regarding her weight management, the patient reports some indulgence in eating but is trying to maintain a focus on protein intake. She expresses uncertainty about  whether additional medication options might be beneficial.          PHYSICAL EXAM:  Blood pressure 127/84, pulse 76, temperature 98.4 F (36.9 C), height 5\' 5"  (1.651 m), weight 268 lb (121.6 kg), SpO2 99%. Body mass index is 44.6 kg/m.  DIAGNOSTIC DATA REVIEWED:  BMET    Component Value Date/Time   NA 141 03/31/2023 0829   K 4.3 03/31/2023 0829   CL 105 03/31/2023 0829   CO2 25 03/31/2023 0829   GLUCOSE 78 03/31/2023 0829   BUN 15 03/31/2023 0829   CREATININE 0.59 03/31/2023 0829   CALCIUM 9.3 03/31/2023 0829   GFRNONAA 100 12/27/2019 1150   GFRAA 115 12/27/2019 1150   Lab Results  Component Value Date   HGBA1C 5.3 03/31/2023   HGBA1C 5.3 09/12/2018   Lab Results  Component Value Date   INSULIN 5.5 03/31/2023   INSULIN 8.8 09/12/2018   Lab Results  Component Value Date   TSH 2.210 11/19/2022   CBC    Component Value Date/Time   WBC 3.5 11/19/2022 0842   RBC 4.53 11/19/2022 0842   HGB 13.5 11/19/2022 0842   HCT 42.7 11/19/2022 0842   PLT 204 11/19/2022 0842   MCV 94 11/19/2022 0842   MCH 29.8 11/19/2022 0842   MCHC 31.6 11/19/2022 0842   RDW 13.3 11/19/2022 0842   Iron Studies    Component Value Date/Time   IRON 54 06/05/2020 0000   TIBC 441 06/05/2020 0000   FERRITIN 17 09/12/2018 0957   IRONPCTSAT 12 06/05/2020 0000   Lipid Panel  Component Value Date/Time   CHOL 155 11/19/2022 0842   TRIG 36 11/19/2022 0842   HDL 73 11/19/2022 0842   CHOLHDL 2.3 01/28/2021 1115   LDLCALC 73 11/19/2022 0842   Hepatic Function Panel     Component Value Date/Time   PROT 6.0 03/31/2023 0829   ALBUMIN 3.9 03/31/2023 0829   AST 28 03/31/2023 0829   ALT 32 03/31/2023 0829   ALKPHOS 65 03/31/2023 0829   BILITOT 0.4 03/31/2023 0829      Component Value Date/Time   TSH 2.210 11/19/2022 0842   Nutritional Lab Results  Component Value Date   VD25OH 53.4 03/31/2023   VD25OH 36.3 11/19/2022   VD25OH 41.1 05/27/2022     Assessment and Plan     Depression with Emotional Eating Behaviors Chronic depression with associated emotional eating. Currently on Lexapro 30 mg daily with variable effectiveness. Significant stressors include a friend's prolonged hospitalization, husband's health issues, and financial stress. Expresses feelings of resentment and fatigue. Discussed the importance of breaks and short vacations for mental health. Informed about potential need for additional or alternative medications if symptoms persist. - Continue Lexapro 30 mg daily - Encourage breaks and short vacations for mental health - Consider additional or alternative medications if symptoms persist  Obesity Gained 2 pounds in the last month. Following category three eating plan 50% of the time and not currently exercising. Advised to focus on protein intake to mitigate weight gain. Discussed importance of adherence to eating plan and starting an exercise regimen. - Encourage adherence to eating plan - Emphasize protein intake - Recommend starting an exercise regimen  General Health Maintenance Upcoming annual physical with primary care physician in January. Routine labs and screenings managed by primary care. Discussed ensuring primary care physician checks vitamin D, B12, glucose, A1c, and insulin levels. - Ensure primary care physician checks vitamin D, B12, glucose, A1c, and insulin levels  Follow-up - Schedule follow-up appointment in February.         I have personally spent 30 minutes total time today in preparation, patient care, and documentation for this visit, including the following: review of clinical lab tests; review of medical tests/procedures/services.    She was informed of the importance of frequent follow up visits to maximize her success with intensive lifestyle modifications for her multiple health conditions.    Quillian Quince, MD

## 2023-07-29 ENCOUNTER — Ambulatory Visit (INDEPENDENT_AMBULATORY_CARE_PROVIDER_SITE_OTHER): Payer: BC Managed Care – PPO | Admitting: Family Medicine

## 2023-09-01 ENCOUNTER — Ambulatory Visit (INDEPENDENT_AMBULATORY_CARE_PROVIDER_SITE_OTHER): Payer: BC Managed Care – PPO | Admitting: Family Medicine

## 2023-10-13 ENCOUNTER — Other Ambulatory Visit: Payer: Self-pay | Admitting: Family Medicine

## 2023-10-13 DIAGNOSIS — Z1231 Encounter for screening mammogram for malignant neoplasm of breast: Secondary | ICD-10-CM

## 2023-10-18 ENCOUNTER — Ambulatory Visit (INDEPENDENT_AMBULATORY_CARE_PROVIDER_SITE_OTHER): Payer: BC Managed Care – PPO | Admitting: Family Medicine

## 2023-10-18 ENCOUNTER — Encounter (INDEPENDENT_AMBULATORY_CARE_PROVIDER_SITE_OTHER): Payer: Self-pay | Admitting: Family Medicine

## 2023-10-18 VITALS — BP 110/75 | HR 79 | Temp 98.3°F | Ht 65.0 in | Wt 273.0 lb

## 2023-10-18 DIAGNOSIS — F3289 Other specified depressive episodes: Secondary | ICD-10-CM

## 2023-10-18 DIAGNOSIS — E669 Obesity, unspecified: Secondary | ICD-10-CM | POA: Diagnosis not present

## 2023-10-18 DIAGNOSIS — Z6841 Body Mass Index (BMI) 40.0 and over, adult: Secondary | ICD-10-CM | POA: Diagnosis not present

## 2023-10-18 DIAGNOSIS — F5089 Other specified eating disorder: Secondary | ICD-10-CM | POA: Diagnosis not present

## 2023-10-18 MED ORDER — ESCITALOPRAM OXALATE 10 MG PO TABS: 10.0000 mg | ORAL_TABLET | Freq: Every day | ORAL | 0 refills | Status: DC

## 2023-10-18 MED ORDER — ESCITALOPRAM OXALATE 20 MG PO TABS
20.0000 mg | ORAL_TABLET | Freq: Every day | ORAL | 0 refills | Status: DC
Start: 2023-10-18 — End: 2024-01-17

## 2023-10-18 NOTE — Progress Notes (Signed)
 Office: [phone]  /  Fax: [phone]  WEIGHT SUMMARY AND BIOMETRICS  Anthropometric Measurements Height: 5\' 5"  (1.651 m) Weight: 273 lb (123.8 kg) BMI (Calculated): 45.43 Weight at Last Visit: 268 lb Weight Lost Since Last Visit: 0 Weight Gained Since Last Visit: 5 lb Starting Weight: 338 lb Total Weight Loss (lbs): 65 lb (29.5 kg)   Body Composition  Body Fat %: 43.4 % Fat Mass (lbs): 118.8 lbs Muscle Mass (lbs): 147.2 lbs Total Body Water (lbs): 108.2 lbs Visceral Fat Rating : 14   Other Clinical Data Fasting: No Labs: No Today's Visit #: 8 Starting Date: 09/12/18    Chief Complaint: OBESITY   History of Present Illness   Rebecca Dominguez is a 48 year old female who presents for obesity treatment follow-up.  She has been following the category three plan about 25% of the time and has not been exercising. Over the last three and a half months, she has gained five pounds. Financial stress and changes in her husband's employment status have impacted her grocery shopping habits and meal planning, contributing to her difficulty in adhering to her plan.  She has a history of emotional eating behaviors, managed with Lexapro at 30 mg per day, for which she requests a refill. Increased stress due to financial issues and her husband's health problems has exacerbated her emotional eating and adherence to her meal plan.  Her current diet includes the same breakfast daily and two to three protein shakes per day. Financial constraints have led to more carbohydrate-heavy meals, and increased eating out due to social events and convenience.  There was a delay in her follow-up visit due to a change in insurance, which has been resolved. She missed a previous appointment in January due to illness, which was not COVID-19, and her husband's job resignation, necessitating a change in insurance coverage.          PHYSICAL EXAM:  Blood pressure 110/75, pulse 79, temperature  98.3 F (36.8 C), height 5\' 5"  (1.651 m), weight 273 lb (123.8 kg), SpO2 99%. Body mass index is 45.43 kg/m.  DIAGNOSTIC DATA REVIEWED:  BMET    Component Value Date/Time   NA 141 03/31/2023 0829   K 4.3 03/31/2023 0829   CL 105 03/31/2023 0829   CO2 25 03/31/2023 0829   GLUCOSE 78 03/31/2023 0829   BUN 15 03/31/2023 0829   CREATININE 0.59 03/31/2023 0829   CALCIUM 9.3 03/31/2023 0829   GFRNONAA 100 12/27/2019 1150   GFRAA 115 12/27/2019 1150   Lab Results  Component Value Date   HGBA1C 5.3 03/31/2023   HGBA1C 5.3 09/12/2018   Lab Results  Component Value Date   INSULIN 5.5 03/31/2023   INSULIN 8.8 09/12/2018   Lab Results  Component Value Date   TSH 2.210 11/19/2022   CBC    Component Value Date/Time   WBC 3.5 11/19/2022 0842   RBC 4.53 11/19/2022 0842   HGB 13.5 11/19/2022 0842   HCT 42.7 11/19/2022 0842   PLT 204 11/19/2022 0842   MCV 94 11/19/2022 0842   MCH 29.8 11/19/2022 0842   MCHC 31.6 11/19/2022 0842   RDW 13.3 11/19/2022 0842   Iron Studies    Component Value Date/Time   IRON 54 06/05/2020 0000   TIBC 441 06/05/2020 0000   FERRITIN 17 09/12/2018 0957   IRONPCTSAT 12 06/05/2020 0000   Lipid Panel     Component Value Date/Time   CHOL 155 11/19/2022 0842   TRIG 36 11/19/2022  0842   HDL 73 11/19/2022 0842   CHOLHDL 2.3 01/28/2021 1115   LDLCALC 73 11/19/2022 0842   Hepatic Function Panel     Component Value Date/Time   PROT 6.0 03/31/2023 0829   ALBUMIN 3.9 03/31/2023 0829   AST 28 03/31/2023 0829   ALT 32 03/31/2023 0829   ALKPHOS 65 03/31/2023 0829   BILITOT 0.4 03/31/2023 0829      Component Value Date/Time   TSH 2.210 11/19/2022 0842   Nutritional Lab Results  Component Value Date   VD25OH 53.4 03/31/2023   VD25OH 36.3 11/19/2022   VD25OH 41.1 05/27/2022     Assessment and Plan    Obesity Adherence to the category three obesity treatment plan is approximately 25%. She has gained five pounds over the last three and a  half months, attributed to high stress levels from financial issues and her husband's health problems, leading to increased emotional eating and carbohydrate consumption. She is attempting to improve meal planning and preparation, using protein shakes to enhance protein intake, and considering cost-effective meal options like Crock-Pot recipes. - Discuss strategies with husband to improve adherence to meal plan - Continue using protein shakes to supplement protein intake - Encourage meal planning and preparation to improve dietary adherence - Provide Crock-Pot recipes to facilitate easy meal preparation  Emotional Eating Behaviors Emotional eating behaviors are managed with Lexapro 30 mg daily. Increased stress from financial issues and her husband's health problems exacerbates these behaviors. She is aware of her emotional eating and is managing it through meal planning and protein shakes. She requests a Lexapro refill to continue management. - Refill Lexapro 10 mg and 20 mg for 90 days - Encourage continued use of Lexapro to manage emotional eating behaviors  General Health Maintenance She has received her flu shot and COVID booster. Sinusitis-like symptoms in January resolved without complications. No recent labs since September; plans for fasting labs at next visit. - Order fasting labs at next visit to update health status  Follow-up Follow-up is advised to monitor progress with obesity treatment and emotional eating behaviors. - Schedule follow-up appointment in 4 to 6 weeks - Ensure next visit is fasting for lab work       She was informed of the importance of frequent follow up visits to maximize her success with intensive lifestyle modifications for her multiple health conditions.    Quillian Quince, MD

## 2023-11-10 ENCOUNTER — Ambulatory Visit
Admission: RE | Admit: 2023-11-10 | Discharge: 2023-11-10 | Disposition: A | Discharge status: Home / Self Care | Source: Ambulatory Visit | Attending: Family Medicine | Admitting: Family Medicine

## 2023-11-10 DIAGNOSIS — Z1231 Encounter for screening mammogram for malignant neoplasm of breast: Secondary | ICD-10-CM

## 2023-11-15 ENCOUNTER — Other Ambulatory Visit: Payer: Self-pay | Admitting: Family Medicine

## 2023-11-15 DIAGNOSIS — R928 Other abnormal and inconclusive findings on diagnostic imaging of breast: Secondary | ICD-10-CM

## 2023-11-25 ENCOUNTER — Ambulatory Visit
Admission: RE | Admit: 2023-11-25 | Discharge: 2023-11-25 | Disposition: A | Discharge status: Home / Self Care | Source: Ambulatory Visit | Attending: Family Medicine | Admitting: Family Medicine

## 2023-11-25 ENCOUNTER — Other Ambulatory Visit: Payer: Self-pay | Admitting: Family Medicine

## 2023-11-25 DIAGNOSIS — R928 Other abnormal and inconclusive findings on diagnostic imaging of breast: Secondary | ICD-10-CM

## 2023-11-25 DIAGNOSIS — N6320 Unspecified lump in the left breast, unspecified quadrant: Secondary | ICD-10-CM

## 2023-11-25 HISTORY — PX: BREAST BIOPSY: SHX20

## 2023-11-26 ENCOUNTER — Telehealth: Payer: Self-pay | Admitting: *Deleted

## 2023-11-26 LAB — SURGICAL PATHOLOGY

## 2023-11-26 NOTE — Telephone Encounter (Signed)
 LVM to patient in reference to upcoming appointment, left my contact to call back, also emailed paperwork and appointment information

## 2023-11-29 ENCOUNTER — Encounter: Payer: Self-pay | Admitting: *Deleted

## 2023-11-29 ENCOUNTER — Ambulatory Visit (INDEPENDENT_AMBULATORY_CARE_PROVIDER_SITE_OTHER): Admitting: Family Medicine

## 2023-11-29 ENCOUNTER — Encounter (INDEPENDENT_AMBULATORY_CARE_PROVIDER_SITE_OTHER): Payer: Self-pay | Admitting: Family Medicine

## 2023-11-29 VITALS — BP 122/77 | HR 87 | Temp 98.2°F | Ht 65.0 in | Wt 271.0 lb

## 2023-11-29 DIAGNOSIS — E669 Obesity, unspecified: Secondary | ICD-10-CM

## 2023-11-29 DIAGNOSIS — C50212 Malignant neoplasm of upper-inner quadrant of left female breast: Secondary | ICD-10-CM

## 2023-11-29 DIAGNOSIS — F5089 Other specified eating disorder: Secondary | ICD-10-CM

## 2023-11-29 DIAGNOSIS — F3289 Other specified depressive episodes: Secondary | ICD-10-CM

## 2023-11-29 DIAGNOSIS — Z6841 Body Mass Index (BMI) 40.0 and over, adult: Secondary | ICD-10-CM

## 2023-11-29 DIAGNOSIS — C50912 Malignant neoplasm of unspecified site of left female breast: Secondary | ICD-10-CM | POA: Diagnosis not present

## 2023-11-29 NOTE — Progress Notes (Signed)
 Office: [phone]  /  Fax: [phone]  WEIGHT SUMMARY AND BIOMETRICS  Anthropometric Measurements Height: 5\' 5"  (1.651 m) Weight: 271 lb (122.9 kg) BMI (Calculated): 45.1 Weight at Last Visit: 273 lb Weight Lost Since Last Visit: 2 lb Weight Gained Since Last Visit: 0 Starting Weight: 338 lb Total Weight Loss (lbs): 67 lb (30.4 kg) Peak Weight: 358 lb   Body Composition  Body Fat %: 54.4 % Fat Mass (lbs): 147.8 lbs Muscle Mass (lbs): 117.8 lbs Total Body Water (lbs): 96.6 lbs Visceral Fat Rating : 18   Other Clinical Data Fasting: yes Labs: no Today's Visit #: 2 Starting Date: 09/12/18    Chief Complaint: OBESITY   Discussed the use of AI scribe software for clinical note transcription with the patient, who gave verbal consent to proceed.  History of Present Illness Rebecca Dominguez is a 48 year old female who presents for obesity treatment follow-up and recent breast cancer diagnosis.  She follows her category three eating plan about 25% of the time and has not been exercising recently. Despite this, she has lost two pounds in the last six weeks since her last visit.  She recently underwent a diagnostic workup for breast cancer following a screening mammogram that led to a callback. A diagnostic mammogram and subsequent ultrasound revealed a small lesion in the left breast, measuring four millimeters. A biopsy confirmed the presence of invasive mammary carcinoma, no special type, grade one. The ultrasound indicated clear lymph nodes, but further testing is planned during surgery to ensure no microscopic spread.  Her family history is significant for breast cancer, as her mother was diagnosed at the age of 66, the same age she is now. She ensures to have annual mammograms due to this family history.  She is experiencing significant stress and emotional distress following the diagnosis, which has impacted her blood pressure, noted to be slightly elevated. She  has been experiencing headaches over the past few days, likely related to stress.  She is concerned about the impact of her diagnosis on her upcoming vacation plans and work commitments.      PHYSICAL EXAM:  Blood pressure 122/77, pulse 87, temperature 98.2 F (36.8 C), height 5\' 5"  (1.651 m), weight 271 lb (122.9 kg), SpO2 100%. Body mass index is 45.1 kg/m.  DIAGNOSTIC DATA REVIEWED:  BMET    Component Value Date/Time   NA 141 03/31/2023 0829   K 4.3 03/31/2023 0829   CL 105 03/31/2023 0829   CO2 25 03/31/2023 0829   GLUCOSE 78 03/31/2023 0829   BUN 15 03/31/2023 0829   CREATININE 0.59 03/31/2023 0829   CALCIUM 9.3 03/31/2023 0829   GFRNONAA 100 12/27/2019 1150   GFRAA 115 12/27/2019 1150   Lab Results  Component Value Date   HGBA1C 5.3 03/31/2023   HGBA1C 5.3 09/12/2018   Lab Results  Component Value Date   INSULIN  5.5 03/31/2023   INSULIN  8.8 09/12/2018   Lab Results  Component Value Date   TSH 2.210 11/19/2022   CBC    Component Value Date/Time   WBC 3.5 11/19/2022 0842   RBC 4.53 11/19/2022 0842   HGB 13.5 11/19/2022 0842   HCT 42.7 11/19/2022 0842   PLT 204 11/19/2022 0842   MCV 94 11/19/2022 0842   MCH 29.8 11/19/2022 0842   MCHC 31.6 11/19/2022 0842   RDW 13.3 11/19/2022 0842   Iron Studies    Component Value Date/Time   IRON 54 06/05/2020 0000   TIBC 441 06/05/2020  0000   FERRITIN 17 09/12/2018 0957   IRONPCTSAT 12 06/05/2020 0000   Lipid Panel     Component Value Date/Time   CHOL 155 11/19/2022 0842   TRIG 36 11/19/2022 0842   HDL 73 11/19/2022 0842   CHOLHDL 2.3 01/28/2021 1115   LDLCALC 73 11/19/2022 0842   Hepatic Function Panel     Component Value Date/Time   PROT 6.0 03/31/2023 0829   ALBUMIN 3.9 03/31/2023 0829   AST 28 03/31/2023 0829   ALT 32 03/31/2023 0829   ALKPHOS 65 03/31/2023 0829   BILITOT 0.4 03/31/2023 0829      Component Value Date/Time   TSH 2.210 11/19/2022 0842   Nutritional Lab Results  Component  Value Date   VD25OH 53.4 03/31/2023   VD25OH 36.3 11/19/2022   VD25OH 41.1 05/27/2022     Assessment and Plan Assessment & Plan Breast cancer, left breast Invasive mammary carcinoma, no special type, grade 1, in the left breast, measuring 4 mm. Lymph nodes appeared clear on ultrasound. Family history of breast cancer; mother diagnosed at the same age. Scheduled for evaluation at the cancer center, including labs and consultations with an oncologist and radiation oncologist. Surgery to remove the lump is planned, with anticipated radiation therapy. Discussed impact on work and vacation plans, and need for [employer] paperwork. Advised against deep internet research to prevent anxiety. Emphasized advancements in treatment since her mother's diagnosis.  - Proceed with scheduled appointment at the cancer center for evaluation and consultation this week. - Discuss FMLA paperwork with employer for time off during treatment. - Avoid deep internet research to prevent anxiety.  Obesity and Emotional Eating Behaviors Following category three eating plan about 25% of the time. No recent exercise. Lost two pounds in the last six weeks. Discussed nutrition's role in reducing cancer risk, emphasizing antioxidant-rich foods to neutralize reactive oxygen species and reduce inflammation. Advised against emotional comfort eating and simple carbohydrates due to their inflammatory nature. Encouraged lean protein and plant-based proteins for healing. - Increase intake of antioxidant-rich foods, especially brightly colored fruits and vegetables like blueberries, raspberries, and purple cabbage. - Avoid simple carbohydrates and focus on lean protein and plant-based proteins. - Encourage regular exercise as part of weight management.      She was informed of the importance of frequent follow up visits to maximize her success with intensive lifestyle modifications for her multiple health conditions.    Jasmine Mesi,  MD

## 2023-12-01 ENCOUNTER — Encounter: Payer: Self-pay | Admitting: *Deleted

## 2023-12-01 ENCOUNTER — Encounter: Payer: Self-pay | Admitting: Physical Therapy

## 2023-12-01 ENCOUNTER — Ambulatory Visit: Attending: General Surgery | Admitting: Physical Therapy

## 2023-12-01 ENCOUNTER — Encounter: Payer: Self-pay | Admitting: General Practice

## 2023-12-01 ENCOUNTER — Ambulatory Visit
Admission: RE | Admit: 2023-12-01 | Discharge: 2023-12-01 | Disposition: A | Discharge status: Home / Self Care | Source: Ambulatory Visit | Attending: Radiation Oncology | Admitting: Radiation Oncology

## 2023-12-01 ENCOUNTER — Encounter: Payer: Self-pay | Admitting: Hematology

## 2023-12-01 ENCOUNTER — Inpatient Hospital Stay (HOSPITAL_BASED_OUTPATIENT_CLINIC_OR_DEPARTMENT_OTHER): Admitting: Genetic Counselor

## 2023-12-01 ENCOUNTER — Encounter: Payer: Self-pay | Admitting: Genetic Counselor

## 2023-12-01 ENCOUNTER — Other Ambulatory Visit: Payer: Self-pay

## 2023-12-01 ENCOUNTER — Inpatient Hospital Stay

## 2023-12-01 ENCOUNTER — Inpatient Hospital Stay: Attending: Hematology | Admitting: Hematology

## 2023-12-01 ENCOUNTER — Ambulatory Visit: Payer: Self-pay | Admitting: General Surgery

## 2023-12-01 VITALS — BP 116/64 | HR 64 | Temp 98.7°F | Resp 21 | Ht 65.0 in | Wt 276.1 lb

## 2023-12-01 DIAGNOSIS — C50412 Malignant neoplasm of upper-outer quadrant of left female breast: Secondary | ICD-10-CM | POA: Insufficient documentation

## 2023-12-01 DIAGNOSIS — F419 Anxiety disorder, unspecified: Secondary | ICD-10-CM | POA: Insufficient documentation

## 2023-12-01 DIAGNOSIS — C50812 Malignant neoplasm of overlapping sites of left female breast: Secondary | ICD-10-CM | POA: Insufficient documentation

## 2023-12-01 DIAGNOSIS — Z9884 Bariatric surgery status: Secondary | ICD-10-CM | POA: Diagnosis not present

## 2023-12-01 DIAGNOSIS — Z1721 Progesterone receptor positive status: Secondary | ICD-10-CM | POA: Insufficient documentation

## 2023-12-01 DIAGNOSIS — Z800 Family history of malignant neoplasm of digestive organs: Secondary | ICD-10-CM | POA: Insufficient documentation

## 2023-12-01 DIAGNOSIS — Z170 Estrogen receptor positive status [ER+]: Secondary | ICD-10-CM | POA: Insufficient documentation

## 2023-12-01 DIAGNOSIS — R293 Abnormal posture: Secondary | ICD-10-CM | POA: Insufficient documentation

## 2023-12-01 DIAGNOSIS — Z801 Family history of malignant neoplasm of trachea, bronchus and lung: Secondary | ICD-10-CM | POA: Insufficient documentation

## 2023-12-01 DIAGNOSIS — Z803 Family history of malignant neoplasm of breast: Secondary | ICD-10-CM

## 2023-12-01 DIAGNOSIS — Z975 Presence of (intrauterine) contraceptive device: Secondary | ICD-10-CM | POA: Insufficient documentation

## 2023-12-01 DIAGNOSIS — D509 Iron deficiency anemia, unspecified: Secondary | ICD-10-CM | POA: Diagnosis not present

## 2023-12-01 DIAGNOSIS — E669 Obesity, unspecified: Secondary | ICD-10-CM | POA: Diagnosis not present

## 2023-12-01 DIAGNOSIS — Z808 Family history of malignant neoplasm of other organs or systems: Secondary | ICD-10-CM | POA: Diagnosis not present

## 2023-12-01 DIAGNOSIS — C50212 Malignant neoplasm of upper-inner quadrant of left female breast: Secondary | ICD-10-CM

## 2023-12-01 DIAGNOSIS — Z8042 Family history of malignant neoplasm of prostate: Secondary | ICD-10-CM | POA: Insufficient documentation

## 2023-12-01 DIAGNOSIS — E559 Vitamin D deficiency, unspecified: Secondary | ICD-10-CM | POA: Diagnosis not present

## 2023-12-01 DIAGNOSIS — E538 Deficiency of other specified B group vitamins: Secondary | ICD-10-CM | POA: Insufficient documentation

## 2023-12-01 DIAGNOSIS — Z809 Family history of malignant neoplasm, unspecified: Secondary | ICD-10-CM | POA: Insufficient documentation

## 2023-12-01 DIAGNOSIS — Z1731 Human epidermal growth factor receptor 2 positive status: Secondary | ICD-10-CM | POA: Diagnosis not present

## 2023-12-01 DIAGNOSIS — Z79899 Other long term (current) drug therapy: Secondary | ICD-10-CM | POA: Insufficient documentation

## 2023-12-01 LAB — CBC WITH DIFFERENTIAL (CANCER CENTER ONLY)
Abs Immature Granulocytes: 0.01 10*3/uL (ref 0.00–0.07)
Basophils Absolute: 0 10*3/uL (ref 0.0–0.1)
Basophils Relative: 1 %
Eosinophils Absolute: 0.1 10*3/uL (ref 0.0–0.5)
Eosinophils Relative: 2 %
HCT: 40.2 % (ref 36.0–46.0)
Hemoglobin: 13.8 g/dL (ref 12.0–15.0)
Immature Granulocytes: 0 %
Lymphocytes Relative: 30 %
Lymphs Abs: 1.8 10*3/uL (ref 0.7–4.0)
MCH: 31.2 pg (ref 26.0–34.0)
MCHC: 34.3 g/dL (ref 30.0–36.0)
MCV: 90.7 fL (ref 80.0–100.0)
Monocytes Absolute: 0.4 10*3/uL (ref 0.1–1.0)
Monocytes Relative: 7 %
Neutro Abs: 3.6 10*3/uL (ref 1.7–7.7)
Neutrophils Relative %: 60 %
Platelet Count: 178 10*3/uL (ref 150–400)
RBC: 4.43 MIL/uL (ref 3.87–5.11)
RDW: 12.9 % (ref 11.5–15.5)
WBC Count: 5.9 10*3/uL (ref 4.0–10.5)
nRBC: 0 % (ref 0.0–0.2)

## 2023-12-01 LAB — CMP (CANCER CENTER ONLY)
ALT: 28 U/L (ref 0–44)
AST: 25 U/L (ref 15–41)
Albumin: 4 g/dL (ref 3.5–5.0)
Alkaline Phosphatase: 58 U/L (ref 38–126)
Anion gap: 3 — ABNORMAL LOW (ref 5–15)
BUN: 23 mg/dL — ABNORMAL HIGH (ref 6–20)
CO2: 28 mmol/L (ref 22–32)
Calcium: 9.5 mg/dL (ref 8.9–10.3)
Chloride: 109 mmol/L (ref 98–111)
Creatinine: 0.61 mg/dL (ref 0.44–1.00)
GFR, Estimated: >60 mL/min (ref >60)
Glucose, Bld: 86 mg/dL (ref 70–99)
Potassium: 4.7 mmol/L (ref 3.5–5.1)
Sodium: 140 mmol/L (ref 135–145)
Total Bilirubin: 0.4 mg/dL (ref 0.0–1.2)
Total Protein: 6.6 g/dL (ref 6.5–8.1)

## 2023-12-01 LAB — GENETIC SCREENING ORDER

## 2023-12-01 MED ORDER — KETOROLAC TROMETHAMINE 15 MG/ML IJ SOLN: 15.0000 mg | INTRAMUSCULAR | Status: AC

## 2023-12-01 NOTE — Therapy (Signed)
 OUTPATIENT PHYSICAL THERAPY BREAST CANCER BASELINE EVALUATION   Patient Name: Rebecca Dominguez MRN: [id number] DOB:30-Mar-1976, 48 y.o., female Today's Date: 12/01/2023  END OF SESSION:  PT End of Session - 12/01/23 2053     Visit Number 1    Number of Visits 2    Date for PT Re-Evaluation 01/26/24    PT Start Time 1347    PT Stop Time 1404   Also saw pt from [phone] for a total of 32 min   PT Time Calculation (min) 17 min    Activity Tolerance Patient tolerated treatment well    Behavior During Therapy WFL for tasks assessed/performed             Past Medical History:  Diagnosis Date   Anemia    low iron   Back pain    Breast mass    Family history of brain cancer    Family history of breast cancer    Family history of prostate cancer    Foot pain    from previous fracture of toe 2010   Gallbladder problem    removed   Headache    occasional   Hypertension    no longer on medications since weight loss   Joint pain    Obesity    Plantar fasciitis    Vitamin B 12 deficiency    Vitamin D  deficiency    Past Surgical History:  Procedure Laterality Date   BREAST BIOPSY Left 11/25/2023   US  LT BREAST BX W LOC DEV 1ST LESION IMG BX SPEC US  GUIDE 11/25/2023 [facility] MAMMOGRAPHY   CHOLECYSTECTOMY  2013   COLONOSCOPY WITH PROPOFOL  N/A 03/06/2016   Procedure: COLONOSCOPY WITH PROPOFOL ;  Surgeon: Evangeline Hilts, MD;  Location: [facility] ENDOSCOPY;  Service: Endoscopy;  Laterality: N/A;   gastric bypass surgery  2001   PILONIDAL CYST EXCISION     WISDOM TOOTH EXTRACTION     Patient Active Problem List   Diagnosis Date Noted   Family history of breast cancer    Family history of brain cancer    Family history of prostate cancer    Malignant neoplasm of upper-inner quadrant of left female breast (HCC) 11/29/2023   Dysfunctional family processes 06/01/2023   Other constipation 03/31/2023   Bilateral leg edema 12/22/2022   Stress 12/22/2022   Low vitamin D  level 12/22/2022    Generalized edema 09/15/2022   BMI 40.0-44.9, adult (HCC) 09/15/2022   Obesity, Beginning BMI 56.25 09/15/2022   Low ferritin level 07/27/2022   Vitamin B 12 deficiency 07/27/2022   Vitamin D  deficiency 05/27/2022   Depression 04/29/2020   Insulin  resistance 10/12/2018   Class 3 severe obesity with serious comorbidity and body mass index (BMI) of 50.0 to 59.9 in adult 10/12/2018    REFERRING PROVIDER: Dr. Lillette Reid  REFERRING DIAG: Left breast cancer  THERAPY DIAG:  Malignant neoplasm of upper-outer quadrant of left breast in female, estrogen receptor positive (HCC)  Abnormal posture  Rationale for Evaluation and Treatment: Rehabilitation  ONSET DATE: 11/10/2023  SUBJECTIVE:  SUBJECTIVE STATEMENT: Patient reports she is here today to be seen by her medical team for her newly diagnosed left breast cancer.   PERTINENT HISTORY:  Patient was diagnosed on 11/10/2023 with left grade 1 invasive ductal carcinoma breast cancer. It measures 4 mm and is located in the upper outer quadrant. It is ER/PR positive and HER2 negative with a Ki67 of 1%.   PATIENT GOALS:   reduce lymphedema risk and learn post op HEP.   PAIN:  Are you having pain? No  PRECAUTIONS: Active CA   RED FLAGS: None   HAND DOMINANCE: right  WEIGHT BEARING RESTRICTIONS: No  FALLS:  Has patient fallen in last 6 months? No  LIVING ENVIRONMENT: Patient lives with: her husband and 77 y.o. stepson Lives in: House/apartment Has following equipment at home: None  OCCUPATION: works in Airline pilot for [employer]: She does not exercise  PRIOR LEVEL OF FUNCTION: Independent   OBJECTIVE: Note: Objective measures were completed at Evaluation unless otherwise noted.  COGNITION: Overall cognitive status: Within functional limits for  tasks assessed    POSTURE:  Forward head and rounded shoulders posture  UPPER EXTREMITY AROM/PROM:  A/PROM RIGHT   eval   Shoulder extension 57  Shoulder flexion 151  Shoulder abduction 157  Shoulder internal rotation 65  Shoulder external rotation 84    (Blank rows = not tested)  A/PROM LEFT   eval  Shoulder extension 57  Shoulder flexion 135  Shoulder abduction 160  Shoulder internal rotation 77  Shoulder external rotation 87    (Blank rows = not tested)  CERVICAL AROM: All within normal limits  UPPER EXTREMITY STRENGTH: WFL  LYMPHEDEMA ASSESSMENTS (in cm):   LANDMARK RIGHT   eval  10 cm proximal to olecranon process 38.2  Olecranon process 26  10 cm proximal to ulnar styloid process 24.6  Just proximal to ulnar styloid process 16.3  Across hand at thumb web space 18.7  At base of 2nd digit 6  (Blank rows = not tested)  LANDMARK LEFT   eval  10 cm proximal to olecranon process 42.8  Olecranon process 27.3  10 cm proximal to ulnar styloid process 25  Just proximal to ulnar styloid process 16.6  Across hand at thumb web space 18.9  At base of 2nd digit 6  (Blank rows = not tested)  L-DEX LYMPHEDEMA SCREENING:  The patient was assessed using the L-Dex machine today to produce a lymphedema index baseline score. The patient will be reassessed on a regular basis (typically every 3 months) to obtain new L-Dex scores. If the score is > 6.5 points away from his/her baseline score indicating onset of subclinical lymphedema, it will be recommended to wear a compression garment for 4 weeks, 12 hours per day and then be reassessed. If the score continues to be > 6.5 points from baseline at reassessment, we will initiate lymphedema treatment. Assessing in this manner has a 95% rate of preventing clinically significant lymphedema.   L-DEX FLOWSHEETS - 12/01/23 2000       L-DEX LYMPHEDEMA SCREENING   Measurement Type Unilateral    L-DEX MEASUREMENT EXTREMITY Upper  Extremity    POSITION  Standing    DOMINANT SIDE Right    At Risk Side Left    BASELINE SCORE (UNILATERAL) 16.4   Attempted a baseline 2x and both were out of normal range            QUICK DASH SURVEY:  Rebecca Dominguez - 12/01/23 0001  Open a tight or new jar No difficulty    Do heavy household chores (wash walls, wash floors) No difficulty    Carry a shopping bag or briefcase No difficulty    Wash your back No difficulty    Use a knife to cut food No difficulty    Recreational activities in which you take some force or impact through your arm, shoulder, or hand (golf, hammering, tennis) No difficulty    During the past week, to what extent has your arm, shoulder or hand problem interfered with your normal social activities with family, friends, neighbors, or groups? Not at all    During the past week, to what extent has your arm, shoulder or hand problem limited your work or other regular daily activities Not at all    Arm, shoulder, or hand pain. None    Tingling (pins and needles) in your arm, shoulder, or hand None    Difficulty Sleeping No difficulty    DASH Score 0 %              PATIENT EDUCATION:  Education details: Time spent educating patient on aspects of self-care to maximize post op recovery. Patient was educated on where and how to get a post op compression bra to use to reduce post op edema. Patient was also educated on the use of SOZO screenings and surveillance principles for early identification of lymphedema onset. She was instructed to use the post op pillow in the axilla for pressure and pain relief. Patient educated on lymphedema risk reduction and post op shoulder/posture HEP. Person educated: Patient Education method: Explanation, Demonstration, Handout Education comprehension: Patient verbalized understanding and returned demonstration  HOME EXERCISE PROGRAM: Patient was instructed today in a home exercise program today for post op shoulder range of  motion. These included active assist shoulder flexion in sitting, scapular retraction, wall walking with shoulder abduction, and hands behind head external rotation.  She was encouraged to do these twice a day, holding 3 seconds and repeating 5 times when permitted by her physician.   ASSESSMENT:  CLINICAL IMPRESSION: Patient was diagnosed on 11/10/2023 with left grade 1 invasive ductal carcinoma breast cancer. It measures 4 mm and is located in the upper outer quadrant. It is ER/PR positive and HER2 negative with a Ki67 of 1%. Her multidisciplinary medical team met prior to her assessments to determine a recommended treatment plan. She is planning to have a left lumpectomy and sentinel node biopsy followed by Oncotype testing, radiation, and anti-estrogen therapy. She will benefit from a post op PT reassessment to determine needs and from L-Dex screens every 3 months for 2 years to detect subclinical lymphedema.  Pt will benefit from skilled therapeutic intervention to improve on the following deficits: Decreased knowledge of precautions, impaired UE functional use, pain, decreased ROM, postural dysfunction.   PT treatment/interventions: ADL/self-care home management, pt/family education, therapeutic exercise  REHAB POTENTIAL: Excellent  CLINICAL DECISION MAKING: Stable/uncomplicated  EVALUATION COMPLEXITY: Low   GOALS: Goals reviewed with patient? YES  LONG TERM GOALS: (STG=LTG)    Name Target Date Goal status  1 Pt will be able to verbalize understanding of pertinent lymphedema risk reduction practices relevant to her dx specifically related to skin care.  Baseline:  No knowledge 12/01/2023 Achieved at eval  2 Pt will be able to return demo and/or verbalize understanding of the post op HEP related to regaining shoulder ROM. Baseline:  No knowledge 12/01/2023 Achieved at eval  3 Pt will be able to verbalize  understanding of the importance of viewing the post op After Breast CA Class video  for further lymphedema risk reduction education and therapeutic exercise.  Baseline:  No knowledge 12/01/2023 Achieved at eval  4 Pt will demo she has regained full shoulder ROM and function post operatively compared to baselines.  Baseline: See objective measurements taken today. 01/26/2024     PLAN:  PT FREQUENCY/DURATION: EVAL and 1 follow up appointment.   PLAN FOR NEXT SESSION: will reassess 3-4 weeks post op to determine needs.   Patient will follow up at outpatient cancer rehab 3-4 weeks following surgery.  If the patient requires physical therapy at that time, a specific plan will be dictated and sent to the referring physician for approval. The patient was educated today on appropriate basic range of motion exercises to begin post operatively and the importance of viewing the After Breast Cancer class video following surgery.  Patient was educated today on lymphedema risk reduction practices as it pertains to recommendations that will benefit the patient immediately following surgery.  She verbalized good understanding.    Physical Therapy Information for After Breast Cancer Surgery/Treatment:  Lymphedema is a swelling condition that you may be at risk for in your arm if you have lymph nodes removed from the armpit area.  After a sentinel node biopsy, the risk is approximately 5-9% and is higher after an axillary node dissection.  There is treatment available for this condition and it is not life-threatening.  Contact your physician or physical therapist with concerns. You may begin the 4 shoulder/posture exercises (see additional sheet) when permitted by your physician (typically a week after surgery).  If you have drains, you may need to wait until those are removed before beginning range of motion exercises.  A general recommendation is to not lift your arms above shoulder height until drains are removed.  These exercises should be done to your tolerance and gently.  This is not a "no  pain/no gain" type of recovery so listen to your body and stretch into the range of motion that you can tolerate, stopping if you have pain.  If you are having immediate reconstruction, ask your plastic surgeon about doing exercises as he or she may want you to wait. We encourage you to view the After Breast Cancer class video following surgery.  You will learn information related to lymphedema risk, prevention and treatment and additional exercises to regain mobility following surgery.   While undergoing any medical procedure or treatment, try to avoid blood pressure being taken or needle sticks from occurring on the arm on the side of cancer.   This recommendation begins after surgery and continues for the rest of your life.  This may help reduce your risk of getting lymphedema (swelling in your arm). An excellent resource for those seeking information on lymphedema is the National Lymphedema Network's web site. It can be accessed at www.lymphnet.org If you notice swelling in your hand, arm or breast at any time following surgery (even if it is many years from now), please contact your doctor or physical therapist to discuss this.  Lymphedema can be treated at any time but it is easier for you if it is treated early on.  If you feel like your shoulder motion is not returning to normal in a reasonable amount of time, please contact your surgeon or physical therapist.  [facility] [phone]. [address], [place] [address]  [facility] CLASS After Breast Cancer Class  After Breast Cancer Class is a specially designed exercise class video to assist you in a safe recover after having breast cancer surgery.  In this video you will learn how to get back to full function whether your drains were just removed or if you had surgery a month ago. The video can be viewed on this page: [email or web address].boyd-meyer.org/  or on YouTube here: [email or web address]://[email or web address].  Class Goals  Understand specific stretches to improve the flexibility of you chest and shoulder. Learn ways to safely strengthen your upper body and improve your posture. Understand the warning signs of infection and why you may be at risk for an arm infection. Learn about Lymphedema and prevention.  ** You do not need to view this video until after surgery.  Drains should be removed to participate in the recommended exercises on the video.  Patient was instructed today in a home exercise program today for post op shoulder range of motion. These included active assist shoulder flexion in sitting, scapular retraction, wall walking with shoulder abduction, and hands behind head external rotation.  She was encouraged to do these twice a day, holding 3 seconds and repeating 5 times when permitted by her physician.  Rebecca Dominguez, [place] 12/01/23 9:01 PM

## 2023-12-01 NOTE — Research (Signed)
 Trial:  [employer] 2021-05 - Specimen Collection Study to Evaluate Biomarkers in Subjects with Cancer   Patient Rebecca Dominguez was identified by Dr. Maryalice Smaller as a potential candidate for the above listed study.  This Clinical Research Nurse met with [name], [id number], on 12/01/23 in a manner and location that ensures patient privacy to discuss participation in the above listed research study.  Patient is Accompanied by her spouse.  A copy of the informed consent document with embedded HIPAA language was provided to the patient.  Patient reads, speaks, and understands [place].   Patient was provided with the business card of this Nurse and encouraged to contact the research team with any questions.  Approximately 10 minutes were spent with the patient reviewing the informed consent documents.  Patient was provided the option of taking informed consent documents home to review and was encouraged to review at their convenience with their support network, including other care providers. Patient took the consent documents home to review. Patient is interested in study participation.  Patient is aware they must meet study criteria in order to participate. A member of the research team will follow up with the patient again to determine interest in participation. Dagmar Drones, RN, BSN, [facility] 12/01/2023 4:23 PM

## 2023-12-01 NOTE — Progress Notes (Signed)
 [facility] Spiritual Care Note  Met with Aleyla Samand" and her husband in Breast Multidisciplinary Clinic to introduce Support Center team/resources.  She completed SDOH screening; results follow below.    SDOH Screenings   Food Insecurity: No Food Insecurity (12/01/2023)  Housing: Low Risk  (12/01/2023)  Transportation Needs: No Transportation Needs (12/01/2023)  Utilities: Not At Risk (12/01/2023)  Depression (PHQ2-9): Low Risk  (12/01/2023)  Tobacco Use: Low Risk  (12/01/2023)   Chaplain and patient discussed common feelings and emotions when being diagnosed with cancer, and the importance of support during treatment.  Chaplain informed patient of the support team and support services at [facility].  Chaplain provided contact information and encouraged patient to call with any questions or concerns.  Couple reports some financial strain, as Kathy's husband is currently waiting to hear about his disability application. He is very interested in the Caregiver Support Group. Couple has paid for a 15th-anniversary [place] cruise in October and very much wants to be able to work treatment around those plans.  Follow up needed: Yes.  We plan to follow up by phone in 1-2 weeks regarding possible [employer] peer mentor referral and possible RD referral.   Armenta Bernheim, MDiv, [facility] Pager [phone] Voicemail [phone]

## 2023-12-01 NOTE — Progress Notes (Signed)
 Radiation Oncology         [phone] ________________________________  Multidisciplinary Breast Oncology Clinic [facility]) Initial Outpatient Consultation  Name: Rebecca Dominguez MRN: [id number]  Date: 12/01/2023  DOB: 14-Jan-1976  CC:Victorio Grave, MD  Caralyn Chandler, MD   REFERRING PHYSICIAN: Lillette Reid III, MD  DIAGNOSIS: There were no encounter diagnoses.  Stage IA (cT1a, cN0, cM0) Left Breast UIQ, Invasive Ductal Carcinoma, ER+ / PR+ / Her2-, Grade 1  No diagnosis found.  HISTORY OF PRESENT ILLNESS::Rebecca Dominguez is a 48 y.o. female who is presenting to the office today for evaluation of her newly diagnosed breast cancer. She is accompanied by her husband. She is doing well overall.   She had routine screening mammography on 11/10/23 showing a possible abnormality in the left breast. She underwent a left breast diagnostic mammography with tomography and left breast ultrasonography at [facility] on 11/25/23 showing: an indeterminate 4 mm mass in the 9 o'clock left breast located 7 cmfn. No evidence of left axillary lymphadenopathy was demonstrated.   Biopsy of the 9 o'clock left breast on 11/25/23 showed: a grade 1 invasive mammary/ductal carcinoma measuring 5 mm in the greatest linear extent of the sample with calcifications . Prognostic indicators significant for: estrogen receptor, 70% positive with weak staining intensity and progesterone receptor, 100% positive with strong staining intensity. Proliferation marker Ki67 at 1%. HER2 negative.  The patient was referred today for presentation in the multidisciplinary conference.  Radiology studies and pathology slides were presented there for review and discussion of treatment options.  A consensus was discussed regarding potential next steps.  PREVIOUS RADIATION THERAPY: No  PAST MEDICAL HISTORY:  Past Medical History:  Diagnosis Date   Anemia    low iron   Back pain    Breast mass    Family history of brain  cancer    Family history of breast cancer    Foot pain    from previous fracture of toe 2010   Gallbladder problem    removed   Headache    occasional   Hypertension    no longer on medications since weight loss   Joint pain    Obesity    Plantar fasciitis    Vitamin B 12 deficiency    Vitamin D  deficiency     PAST SURGICAL HISTORY: Past Surgical History:  Procedure Laterality Date   BREAST BIOPSY Left 11/25/2023   US  LT BREAST BX W LOC DEV 1ST LESION IMG BX SPEC US  GUIDE 11/25/2023 [facility] MAMMOGRAPHY   CHOLECYSTECTOMY  2013   COLONOSCOPY WITH PROPOFOL  N/A 03/06/2016   Procedure: COLONOSCOPY WITH PROPOFOL ;  Surgeon: Evangeline Hilts, MD;  Location: [facility] ENDOSCOPY;  Service: Endoscopy;  Laterality: N/A;   gastric bypass surgery  2001   PILONIDAL CYST EXCISION     WISDOM TOOTH EXTRACTION      FAMILY HISTORY:  Family History  Problem Relation Age of Onset   Obesity Mother    Breast cancer Mother 35   Colon cancer Father 67   Diabetes Father    Obesity Father    Throat cancer Paternal Uncle    Lung cancer Maternal Grandmother 22   Cancer Paternal Grandmother        "female' cancer in her 69s-40s   Brain cancer Cousin 20       pat first cousin    SOCIAL HISTORY:  Social History   Socioeconomic History   Marital status: Married    Spouse name: Merced Trenchard  Number of children: 0   Years of education: Not on file   Highest education level: Not on file  Occupational History   Occupation: Customer Service  Tobacco Use   Smoking status: Never   Smokeless tobacco: Never  Substance and Sexual Activity   Alcohol use: Yes    Comment: rare/special occasion   Drug use: No   Sexual activity: Not on file  Other Topics Concern   Not on file  Social History Narrative   Not on file   Social Drivers of Health   Financial Resource Strain: Not on file  Food Insecurity: Not on file  Transportation Needs: Not on file  Physical Activity: Not on file  Stress: Not on file   Social Connections: Not on file    ALLERGIES: No Known Allergies  MEDICATIONS:  Current Outpatient Medications  Medication Sig Dispense Refill   aspirin-acetaminophen-caffeine (EXCEDRIN MIGRAINE) 250-250-65 MG tablet Take 1 tablet by mouth every 6 (six) hours as needed for headache.     cyanocobalamin  (VITAMIN B12) 500 MCG tablet Take 1 tablet (500 mcg total) by mouth daily. 30 tablet 2   escitalopram  (LEXAPRO ) 10 MG tablet Take 1 tablet (10 mg total) by mouth daily. 90 tablet 0   escitalopram  (LEXAPRO ) 20 MG tablet Take 1 tablet (20 mg total) by mouth daily. 90 tablet 0   ferrous sulfate 325 (65 FE) MG tablet Take 325 mg by mouth daily.     Glucosamine-Chondroit-Vit C-Mn (GLUCOSAMINE CHONDR 1500 COMPLX) CAPS Take 1 capsule by mouth daily.     levonorgestrel (MIRENA) 20 MCG/24HR IUD 1 each by Intrauterine route once.     Multiple Vitamins-Minerals (MULTIVITAMIN WOMEN) TABS Take 1 tablet by mouth daily.     naproxen (NAPROSYN) 500 MG tablet Take 500 mg by mouth 2 (two) times daily as needed.      vitamin C (ASCORBIC ACID) 500 MG tablet Take 500 mg by mouth daily.     No current facility-administered medications for this encounter.    REVIEW OF SYSTEMS: A 10+ POINT REVIEW OF SYSTEMS WAS OBTAINED including neurology, dermatology, psychiatry, cardiac, respiratory, lymph, extremities, GI, GU, musculoskeletal, constitutional, reproductive, HEENT.    PHYSICAL EXAM:    12/01/2023  Vitals with BMI   Height 5\' 5"    Weight 276 lbs 2 oz   BMI 45.95   Systolic 116   Diastolic 64   Pulse 64    Lungs are clear to auscultation bilaterally. Heart has regular rate and rhythm. No palpable cervical, supraclavicular, or axillary adenopathy. Abdomen soft, non-tender, normal bowel sounds. Breast: Right breast with no palpable mass, nipple discharge, or bleeding. Left breast with bruising in the lower inner aspect with biopsy noted in this area. There is no nipple discharge or bleeding and no dominant  mass appreciated.  KPS = 90  100 - Normal; no complaints; no evidence of disease. 90   - Able to carry on normal activity; minor signs or symptoms of disease. 80   - Normal activity with effort; some signs or symptoms of disease. 98   - Cares for self; unable to carry on normal activity or to do active work. 60   - Requires occasional assistance, but is able to care for most of his personal needs. 50   - Requires considerable assistance and frequent medical care. 40   - Disabled; requires special care and assistance. 30   - Severely disabled; hospital admission is indicated although death not imminent. 20   - Very sick; hospital admission  necessary; active supportive treatment necessary. 10   - Moribund; fatal processes progressing rapidly. 0     - Dead  Karnofsky DA, Abelmann WH, Craver LS and Burchenal [facility] [phone]) The use of the nitrogen mustards in the palliative treatment of carcinoma: with particular reference to bronchogenic carcinoma Cancer 1 634-56  LABORATORY DATA:  Lab Results  Component Value Date   WBC 5.9 12/01/2023   HGB 13.8 12/01/2023   HCT 40.2 12/01/2023   MCV 90.7 12/01/2023   PLT 178 12/01/2023   Lab Results  Component Value Date   NA 140 12/01/2023   K 4.7 12/01/2023   CL 109 12/01/2023   CO2 28 12/01/2023   Lab Results  Component Value Date   ALT 28 12/01/2023   AST 25 12/01/2023   ALKPHOS 58 12/01/2023   BILITOT 0.4 12/01/2023    PULMONARY FUNCTION TEST:   Review Flowsheet        No data to display          RADIOGRAPHY: US  LT BREAST BX W LOC DEV 1ST LESION IMG BX SPEC US  GUIDE Addendum Date: 11/29/2023 ADDENDUM REPORT: 11/29/2023 08:51 ADDENDUM: Pathology revealed GRADE I INVASIVE MAMMARY CARCINOMA, NO SPECIAL TYPE (DUCTAL), CALCIFICATIONS: PRESENT of the LEFT breast, 9:00 o'clock, middle depth, 7cmfn, (ribbon clip). This was found to be concordant by Dr. Dru Georges. Pathology results were discussed with the patient by telephone. The  patient reported doing well after the biopsy with tenderness at the site. Post biopsy instructions and care were reviewed and questions were answered. The patient was encouraged to call [facility] for any additional concerns. My direct phone number was provided. The patient was referred to [facility] on Dec 01, 2023. Pathology results reported by Kraig Peru, RN on 11/26/2023. Electronically Signed   By: Dru Georges M.D.   On: 11/29/2023 08:51   Result Date: 11/29/2023 CLINICAL DATA:  48 year old female with new 4 mm mass in the left breast medial upper quadrant at 9 o'clock 7 cm from the nipple, middle depth. EXAM: ULTRASOUND GUIDED LEFT BREAST CORE NEEDLE BIOPSY COMPARISON:  Previous exam(s). PROCEDURE: I met with the patient and we discussed the procedure of ultrasound-guided biopsy, including benefits and alternatives. We discussed the high likelihood of a successful procedure. We discussed the risks of the procedure, including infection, bleeding, tissue injury, clip migration, and inadequate sampling. Informed written consent was given. The usual time-out protocol was performed immediately prior to the procedure. Lesion quadrant: Left breast 9 o'clock 7 cm from the nipple Using sterile technique and 1% Lidocaine as local anesthetic, under direct ultrasound visualization, a 12 gauge spring-loaded device was used to perform biopsy of 4 mm mass using a caudocranial approach. At the conclusion of the procedure ribbon shaped tissue marker clip was deployed into the biopsy cavity. Follow up 2 view mammogram was performed and dictated separately. IMPRESSION: Ultrasound guided biopsy of 4 mm left breast mass at 9 o'clock middle depth with placement of a ribbon clip. No apparent complications. Electronically Signed: By: Dru Georges M.D. On: 11/25/2023 12:22   MM CLIP PLACEMENT LEFT Result Date:  11/25/2023 CLINICAL DATA:  48 year old female status post ultrasound-guided biopsy of a 4 mm mass left breast 9 o'clock middle depth with placement of a ribbon clip. EXAM: 3D DIAGNOSTIC LEFT MAMMOGRAM POST ULTRASOUND BIOPSY COMPARISON:  Previous exam(s). ACR Breast Density Category b: There are scattered areas of fibroglandular density. FINDINGS: 3D  Mammographic images were obtained following ultrasound guided biopsy of a 4 mm mass in the left breast at 9 o'clock 7 cm from the nipple with placement of a ribbon clip. The biopsy marking clip is in expected position at the site of biopsy. IMPRESSION: Appropriate positioning of the ribbon shaped biopsy marking clip at the site of biopsy in the 9 o'clock position middle depth. Final Assessment: Post Procedure Mammograms for Marker Placement Electronically Signed   By: Dru Georges M.D.   On: 11/25/2023 12:27   MM 3D DIAGNOSTIC MAMMOGRAM UNILATERAL LEFT BREAST Result Date: 11/25/2023 CLINICAL DATA:  Recall from screening mammography, possible asymmetry in the inner LEFT breast at middle depth. Family history of breast cancer in her mother who was diagnosed in her 81s. EXAM: DIGITAL DIAGNOSTIC UNILATERAL LEFT MAMMOGRAM WITH TOMOSYNTHESIS AND CAD; ULTRASOUND LEFT BREAST LIMITED TECHNIQUE: Left digital diagnostic mammography and breast tomosynthesis was performed. The images were evaluated with computer-aided detection. ; Targeted ultrasound examination of the left breast was performed. COMPARISON:  Previous exam(s). ACR Breast Density Category b: There are scattered areas of fibroglandular density. FINDINGS: Spot-compression CC and MLO views of the area of concern and a full field mediolateral view were obtained. Partially obscured isodense mass in the inner breast at middle depth, likely near 9 o'clock location, measuring approximately 0.4-0.5 cm, with associated faint round/punctate calcifications. No associated architectural distortion. Targeted ultrasound is  performed, demonstrating a hypoechoic mass with vague and irregular margins at 9 o'clock 7 cm from the nipple measuring approximately 0.4 x 0.3 x 0.4 cm, demonstrating slight posterior acoustic shadowing and no internal power Doppler flow. Sonographic evaluation of the axilla demonstrates no pathologic lymphadenopathy. IMPRESSION: 1. Indeterminate 0.4 cm mass in the inner LEFT breast at 9 o'clock 7 cm from the nipple. 2. No pathologic LEFT axillary lymphadenopathy. RECOMMENDATION: Ultrasound-guided core needle biopsy of the indeterminate LEFT breast mass. I have discussed the findings and recommendations with the patient. The ultrasound core needle biopsy procedure was discussed with the patient and her questions were answered. She wishes to proceed with the biopsy which will be scheduled by [facility] staff. BI-RADS CATEGORY  4: Suspicious. Electronically Signed   By: Rinda Cheers M.D.   On: 11/25/2023 10:25   US  LIMITED ULTRASOUND INCLUDING AXILLA LEFT BREAST  Result Date: 11/25/2023 CLINICAL DATA:  Recall from screening mammography, possible asymmetry in the inner LEFT breast at middle depth. Family history of breast cancer in her mother who was diagnosed in her 83s. EXAM: DIGITAL DIAGNOSTIC UNILATERAL LEFT MAMMOGRAM WITH TOMOSYNTHESIS AND CAD; ULTRASOUND LEFT BREAST LIMITED TECHNIQUE: Left digital diagnostic mammography and breast tomosynthesis was performed. The images were evaluated with computer-aided detection. ; Targeted ultrasound examination of the left breast was performed. COMPARISON:  Previous exam(s). ACR Breast Density Category b: There are scattered areas of fibroglandular density. FINDINGS: Spot-compression CC and MLO views of the area of concern and a full field mediolateral view were obtained. Partially obscured isodense mass in the inner breast at middle depth, likely near 9 o'clock location, measuring approximately 0.4-0.5 cm, with associated faint  round/punctate calcifications. No associated architectural distortion. Targeted ultrasound is performed, demonstrating a hypoechoic mass with vague and irregular margins at 9 o'clock 7 cm from the nipple measuring approximately 0.4 x 0.3 x 0.4 cm, demonstrating slight posterior acoustic shadowing and no internal power Doppler flow. Sonographic evaluation of the axilla demonstrates no pathologic lymphadenopathy. IMPRESSION: 1. Indeterminate 0.4 cm mass in the inner LEFT breast at 9 o'clock 7  cm from the nipple. 2. No pathologic LEFT axillary lymphadenopathy. RECOMMENDATION: Ultrasound-guided core needle biopsy of the indeterminate LEFT breast mass. I have discussed the findings and recommendations with the patient. The ultrasound core needle biopsy procedure was discussed with the patient and her questions were answered. She wishes to proceed with the biopsy which will be scheduled by the [facility] staff. BI-RADS CATEGORY  4: Suspicious. Electronically Signed   By: Rinda Cheers M.D.   On: 11/25/2023 10:25   MM 3D SCREENING MAMMOGRAM BILATERAL BREAST Result Date: 11/12/2023 CLINICAL DATA:  Screening. EXAM: DIGITAL SCREENING BILATERAL MAMMOGRAM WITH TOMOSYNTHESIS AND CAD TECHNIQUE: Bilateral screening digital craniocaudal and mediolateral oblique mammograms were obtained. Bilateral screening digital breast tomosynthesis was performed. The images were evaluated with computer-aided detection. COMPARISON:  Previous exam(s). ACR Breast Density Category b: There are scattered areas of fibroglandular density. FINDINGS: In the left breast, possible asymmetries warrant further evaluation. In the right breast, no findings suspicious for malignancy. IMPRESSION: Further evaluation is suggested for possible asymmetries in the left breast. RECOMMENDATION: Diagnostic mammogram and possibly ultrasound of the left breast. (Code:[id number]-L-21M) The patient will be contacted regarding the findings, and  additional imaging will be scheduled. BI-RADS CATEGORY  0: Incomplete: Need additional imaging evaluation. Electronically Signed   By: Clancy Crimes M.D.   On: 11/12/2023 13:43      IMPRESSION: Stage IA (cT1a, cN0, cM0) Left Breast UIQ, Invasive Ductal Carcinoma, ER+ / PR+ / Her2-, Grade 1  Patient will be a good candidate for breast conservation with radiotherapy to the left breast. We discussed the general course of radiation, potential side effects, and toxicities with radiation and the patient is interested in this approach. ***   PLAN:  Genetics  Left breast lumpectomy with SLN evaluation  Oncotype  Adjuvant radiation therapy  Aromatase inhibitor    ------------------------------------------------  Noralee Beam, PhD, MD  This document serves as a record of services personally performed by Retta Caster, MD. It was created on his behalf by Aleta Anda, a trained medical scribe. The creation of this record is based on the scribe's personal observations and the provider's statements to them. This document has been checked and approved by the attending provider.

## 2023-12-01 NOTE — Progress Notes (Signed)
 REFERRING PROVIDER: Sonja [place], MD [address] [place],  [place] [address]  PRIMARY PROVIDER:  Victorio Grave, MD  PRIMARY REASON FOR VISIT:  1. Family history of breast cancer   2. Family history of brain cancer   3. Malignant neoplasm of upper-inner quadrant of left female breast, unspecified estrogen receptor status (HCC)   4. Family history of prostate cancer      HISTORY OF PRESENT ILLNESS:   Rebecca Dominguez, a 48 y.o. female, was seen for a [facility] cancer genetics consultation at the request of Dr. Maryalice Smaller due to a personal and family history of cancer.  Rebecca Dominguez presents to clinic today to discuss the possibility of a hereditary predisposition to cancer, genetic testing, and to further clarify her future cancer risks, as well as potential cancer risks for family members.   In May 2025, at the age of 37, Rebecca Dominguez was diagnosed with cancer of the left breast. The treatment plan lumpectomy and radiation. The patient reports having genetic testing for breast cancer in 1997.   CANCER HISTORY:  Oncology History  Malignant neoplasm of upper-inner quadrant of left female breast (HCC)  11/25/2023 Cancer Staging   Staging form: Breast, AJCC 8th Edition - Clinical stage from 11/25/2023: Stage IA (cT1a, cN0, cM0, G1, ER+, PR+, HER2-) - Signed by Sonja Dover, MD on 12/01/2023 Stage prefix: Initial diagnosis Histologic grading system: 3 grade system   11/29/2023 Initial Diagnosis   Malignant neoplasm of upper-inner quadrant of left female breast [facility])     Past Medical History:  Diagnosis Date   Anemia    low iron   Back pain    Breast mass    Family history of brain cancer    Family history of breast cancer    Family history of prostate cancer    Foot pain    from previous fracture of toe 2010   Gallbladder problem    removed   Headache    occasional   Hypertension    no longer on medications since weight loss   Joint pain    Obesity    Plantar fasciitis    Vitamin B  12 deficiency    Vitamin D  deficiency     Past Surgical History:  Procedure Laterality Date   BREAST BIOPSY Left 11/25/2023   US  LT BREAST BX W LOC DEV 1ST LESION IMG BX SPEC US  GUIDE 11/25/2023 [facility] MAMMOGRAPHY   CHOLECYSTECTOMY  2013   COLONOSCOPY WITH PROPOFOL  N/A 03/06/2016   Procedure: COLONOSCOPY WITH PROPOFOL ;  Surgeon: Evangeline Hilts, MD;  Location: [facility] ENDOSCOPY;  Service: Endoscopy;  Laterality: N/A;   gastric bypass surgery  2001   PILONIDAL CYST EXCISION     WISDOM TOOTH EXTRACTION      Social History   Socioeconomic History   Marital status: Married    Spouse name: Janalyn Burne   Number of children: 0   Years of education: Not on file   Highest education level: Not on file  Occupational History   Occupation: Customer Service  Tobacco Use   Smoking status: Never   Smokeless tobacco: Never  Substance and Sexual Activity   Alcohol use: Yes    Comment: rare/special occasion   Drug use: No   Sexual activity: Not on file  Other Topics Concern   Not on file  Social History Narrative   Not on file   Social Drivers of Health   Financial Resource Strain: Not on file  Food Insecurity: Not on file  Transportation  Needs: Not on file  Physical Activity: Not on file  Stress: Not on file  Social Connections: Not on file     FAMILY HISTORY:  We obtained a detailed, 4-generation family history.  Significant diagnoses are listed below: Family History  Problem Relation Age of Onset   Obesity Mother    Breast cancer Mother 31   Colon cancer Father 69   Diabetes Father    Obesity Father    Throat cancer Paternal Uncle    Lung cancer Maternal Grandmother 34   Prostate cancer Maternal Grandfather        dx. late 75s   Cancer Paternal Grandmother        "female' cancer in her 36s-40s   Brain cancer Cousin 20       pat first cousin     The patient does not have children.  She has two sisters who are cancer free. One sister reportedly had negative genetic testing.   Her mother is living and her father is deceased.  Her father had colon cancer at 25.  He had two brothers and a sister, one brother had throat cancer.  This brother had a son who died of brain cancer at 71.  The paternal grandmother had a 'female' cancer.  The patient's mother had breast cancer at 38. She has one brother who is cancer free.  The maternal grandfather had prostate caner and the grandmother had lung cancer at 57.  Rebecca Dominguez is aware of previous family history of genetic testing for hereditary cancer risks. There is no reported Ashkenazi Jewish ancestry. There is no known consanguinity.  GENETIC COUNSELING ASSESSMENT: Rebecca Dominguez is a 48 y.o. female with a personal and family history of cancer which is somewhat suggestive of a hereditary cancer syndrome and predisposition to cancer given the combination of cancer in the family and the young ages of onset. We, therefore, discussed and recommended the following at today's visit.   DISCUSSION: We discussed that, in general, most cancer is not inherited in families, but instead is sporadic or familial. Sporadic cancers occur by chance and typically happen at older ages (>50 years) as this type of cancer is caused by genetic changes acquired during an individual's lifetime. Some families have more cancers than would be expected by chance; however, the ages or types of cancer are not consistent with a known genetic mutation or known genetic mutations have been ruled out. This type of familial cancer is thought to be due to a combination of multiple genetic, environmental, hormonal, and lifestyle factors. While this combination of factors likely increases the risk of cancer, the exact source of this risk is not currently identifiable or testable.  We discussed that 5 - 10% of breast cancer is hereditary, with most cases associated with BRCA mutations.  There are other genes that can be associated with hereditary breast cancer syndromes.  These  include ATM, CHEK2 and PALB2.  We discussed that testing is beneficial for several reasons including knowing how to follow individuals after completing their treatment, identifying whether potential treatment options such as PARP inhibitors would be beneficial, and understand if other family members could be at risk for cancer and allow them to undergo genetic testing.   We reviewed the characteristics, features and inheritance patterns of hereditary cancer syndromes. We also discussed genetic testing, including the appropriate family members to test, the process of testing, insurance coverage and turn-around-time for results. We discussed the implications of a negative, positive and/or variant of uncertain  significant result. In order to get genetic test results in a timely manner so that Rebecca Dominguez can use these genetic test results for surgical decisions, we recommended Rebecca Dominguez pursue genetic testing for the BRCAPlus. Once complete, we recommend Rebecca Dominguez pursue reflex genetic testing to the CancerNext-Expanded+RNAinsight gene panel.   The CancerNext-Expanded gene panel offered by [facility] and includes sequencing, rearrangement, and RNA analysis for the following 77 genes: AIP, ALK, APC, ATM, BAP1, BARD1, BMPR1A, BRCA1, BRCA2, BRIP1, CDC73, CDH1, CDK4, CDKN1B, CDKN2A, CEBPA, CHEK2, CTNNA1, DDX41, DICER1, ETV6, FH, FLCN, GATA2, LZTR1, MAX, MBD4, MEN1, MET, MLH1, MSH2, MSH3, MSH6, MUTYH, NF1, NF2, NTHL1, PALB2, PHOX2B, PMS2, POT1, PRKAR1A, PTCH1, PTEN, RAD51C, RAD51D, RB1, RET, RPS20, RUNX1, SDHA, SDHAF2, SDHB, SDHC, SDHD, SMAD4, SMARCA4, SMARCB1, SMARCE1, STK11, SUFU, TMEM127, TP53, TSC1, TSC2, VHL, and WT1 (sequencing and deletion/duplication); AXIN2, CTNNA1, DDX41, EGFR, HOXB13, KIT, MBD4, MITF, MSH3, PDGFRA, POLD1 and POLE (sequencing only); EPCAM and GREM1 (deletion/duplication only). RNA data is routinely analyzed for use in variant interpretation for all genes.   Based on Rebecca Dominguez  personal and family history of cancer, she meets medical criteria for genetic testing. Despite that she meets criteria, she may still have an out of pocket cost.  We discussed that some people do not want to undergo genetic testing due to fear of genetic discrimination.  The Genetic Information Nondiscrimination Act (GINA) was signed into federal law in 2008. GINA prohibits health insurers and most employers from discriminating against individuals based on genetic information (including the results of genetic tests and family history information). According to GINA, health insurance companies cannot consider genetic information to be a preexisting condition, nor can they use it to make decisions regarding coverage or rates. GINA also makes it illegal for most employers to use genetic information in making decisions about hiring, firing, promotion, or terms of employment. It is important to note that GINA does not offer protections for life insurance, disability insurance, or long-term care insurance. GINA does not apply to those in the [employer], those who work for companies with less than 15 employees, and new life insurance or long-term disability insurance policies.  Health status due to a cancer diagnosis is not protected under GINA. More information about GINA can be found by visiting [email or web address].  PLAN: After considering the risks, benefits, and limitations, Rebecca Dominguez provided informed consent to pursue genetic testing and the blood sample was sent to [facility] for analysis of the CancerNext-Expanded+RNAinsight. Results should be available within approximately 2-3 weeks' time, at which point they will be disclosed by telephone to Rebecca Dominguez, as will any additional recommendations warranted by these results. Rebecca Dominguez will receive a summary of her genetic counseling visit and a copy of her results once available. This information will also be available in Epic.   Lastly, we  encouraged Rebecca Dominguez to remain in contact with cancer genetics annually so that we can continuously update the family history and inform her of any changes in cancer genetics and testing that may be of benefit for this family.   Rebecca Dominguez questions were answered to her satisfaction today. Our contact information was provided should additional questions or concerns arise. Thank you for the referral and allowing us  to share in the care of your patient.   Yovan Leeman P. [place], MS, CGC Licensed, Patent attorney [email or web address] phone: [phone]  30 minutes were spent on the date of the encounter in service to the patient including preparation, face-to-face consultation, documentation and care coordination.  The patient brought her husband. Drs. Johnna Nakai, and/or Gudena were available for questions, if needed..    _______________________________________________________________________ For Office Staff:  Number of people involved in session: 1 Was an Intern/ student involved with case: no

## 2023-12-02 NOTE — Progress Notes (Signed)
 [facility]   [phone] Fax:([phone]) [phone]   Clinic New Consult Note   Patient Care Team: Victorio Grave, MD as PCP - General (Family Medicine) Sonja Cannelburg, MD as Consulting Physician (Hematology) Auther Bo, RN as Oncology Nurse Navigator Alane Hsu, RN as Oncology Nurse Navigator Caralyn Chandler, MD as Consulting Physician (General Surgery) Retta Caster, MD as Consulting Physician (Radiation Oncology) 12/02/2023  CHIEF COMPLAINTS/PURPOSE OF CONSULTATION:  Newly diagnosed left breast cancer  REFERRING PHYSICIAN:   Discussed the use of AI scribe software for clinical note transcription with the patient, who gave verbal consent to proceed.  History of Present Illness Rebecca Dominguez is a 48 year old female with past medical history of obesity status post gastric bypass surgery, who presents to our multidisciplinary breast clinic for oncology consultation over newly diagnosed left breast cancer. She is accompanied by her husband.  A screening mammogram in April led to further evaluation with a diagnostic ultrasound and a mammogram on Nov 25, 2023, which showed indeterminate 0.4 cm mass and in the left breast at the 9 o'clock position, 7 cm from the nipple.  Ultrasound of the axillary was negative.  She underwent a biopsy on the same day, which showed invasive ductal carcinoma, grade 1, ER 70% weak positive, PR 100% strong positive, HER2 IHC 2+, negative by FISH, and a Ki-67 1%.  She has no symptoms such as skin changes, nipple discharge, or pain.  She has anemia and takes iron supplements every other day. She also takes supplements for vitamin B12 and vitamin D  deficiencies. She underwent gastric bypass surgery in 2001 and gallbladder removal in 2003.  Her family history includes her father who died of colon cancer at 59 and her mother diagnosed with breast cancer at 48. She has been diligent with colonoscopy screenings since age 34.  She rarely  consumes alcohol, has never smoked, and does not use recreational drugs. She uses a Mirena IUD, which suppresses menstrual bleeding. She experienced a headache over the last few days, likely due to stress.     MEDICAL HISTORY:  Past Medical History:  Diagnosis Date   Anemia    low iron   Back pain    Breast mass    Family history of brain cancer    Family history of breast cancer    Family history of prostate cancer    Foot pain    from previous fracture of toe 2010   Gallbladder problem    removed   Headache    occasional   Hypertension    no longer on medications since weight loss   Joint pain    Obesity    Plantar fasciitis    Vitamin B 12 deficiency    Vitamin D  deficiency     SURGICAL HISTORY: Past Surgical History:  Procedure Laterality Date   BREAST BIOPSY Left 11/25/2023   US  LT BREAST BX W LOC DEV 1ST LESION IMG BX SPEC US  GUIDE 11/25/2023 [facility] MAMMOGRAPHY   CHOLECYSTECTOMY  2013   COLONOSCOPY WITH PROPOFOL  N/A 03/06/2016   Procedure: COLONOSCOPY WITH PROPOFOL ;  Surgeon: Evangeline Hilts, MD;  Location: [facility] ENDOSCOPY;  Service: Endoscopy;  Laterality: N/A;   gastric bypass surgery  2001   PILONIDAL CYST EXCISION     WISDOM TOOTH EXTRACTION      SOCIAL HISTORY: Social History   Socioeconomic History   Marital status: Married    Spouse name: Ellana Waits   Number of children: 0   Years  of education: Not on file   Highest education level: Not on file  Occupational History   Occupation: Customer Service  Tobacco Use   Smoking status: Never   Smokeless tobacco: Never  Substance and Sexual Activity   Alcohol use: Yes    Comment: rare/special occasion   Drug use: No   Sexual activity: Not on file  Other Topics Concern   Not on file  Social History Narrative   Not on file   Social Drivers of Health   Financial Resource Strain: Not on file  Food Insecurity: No Food Insecurity (12/01/2023)   Hunger Vital Sign    Worried About Running Out of Food in  the Last Year: Never true    Ran Out of Food in the Last Year: Never true  Transportation Needs: No Transportation Needs (12/01/2023)   PRAPARE - Administrator, Civil Service (Medical): No    Lack of Transportation (Non-Medical): No  Physical Activity: Not on file  Stress: Not on file  Social Connections: Not on file  Intimate Partner Violence: Not At Risk (12/01/2023)   Humiliation, Afraid, Rape, and Kick questionnaire    Fear of Current or Ex-Partner: No    Emotionally Abused: No    Physically Abused: No    Sexually Abused: No    FAMILY HISTORY: Family History  Problem Relation Age of Onset   Obesity Mother    Breast cancer Mother 16   Colon cancer Father 62   Diabetes Father    Obesity Father    Throat cancer Paternal Uncle    Lung cancer Maternal Grandmother 45   Prostate cancer Maternal Grandfather        dx. late 51s   Cancer Paternal Grandmother        "female' cancer in her 38s-40s   Brain cancer Cousin 20       pat first cousin    ALLERGIES:  has no known allergies.  MEDICATIONS:  Current Outpatient Medications  Medication Sig Dispense Refill   aspirin-acetaminophen-caffeine (EXCEDRIN MIGRAINE) 250-250-65 MG tablet Take 1 tablet by mouth every 6 (six) hours as needed for headache.     cyanocobalamin  (VITAMIN B12) 500 MCG tablet Take 1 tablet (500 mcg total) by mouth daily. 30 tablet 2   escitalopram  (LEXAPRO ) 10 MG tablet Take 1 tablet (10 mg total) by mouth daily. 90 tablet 0   escitalopram  (LEXAPRO ) 20 MG tablet Take 1 tablet (20 mg total) by mouth daily. 90 tablet 0   ferrous sulfate 325 (65 FE) MG tablet Take 325 mg by mouth daily.     Glucosamine-Chondroit-Vit C-Mn (GLUCOSAMINE CHONDR 1500 COMPLX) CAPS Take 1 capsule by mouth daily.     levonorgestrel (MIRENA) 20 MCG/24HR IUD 1 each by Intrauterine route once.     Multiple Vitamins-Minerals (MULTIVITAMIN WOMEN) TABS Take 1 tablet by mouth daily.     naproxen (NAPROSYN) 500 MG tablet Take 500 mg  by mouth 2 (two) times daily as needed.      vitamin C (ASCORBIC ACID) 500 MG tablet Take 500 mg by mouth daily.     No current facility-administered medications for this visit.   Facility-Administered Medications Ordered in Other Visits  Medication Dose Route Frequency Provider Last Rate Last Admin   ketorolac (TORADOL) 15 MG/ML injection 15 mg  15 mg Intravenous On Call to OR Lillette Reid III, MD        REVIEW OF SYSTEMS:   Constitutional: Denies fevers, chills or abnormal night sweats Eyes: Denies blurriness  of vision, double vision or watery eyes Ears, nose, mouth, throat, and face: Denies mucositis or sore throat Respiratory: Denies cough, dyspnea or wheezes Cardiovascular: Denies palpitation, chest discomfort or lower extremity swelling Gastrointestinal:  Denies nausea, heartburn or change in bowel habits Skin: Denies abnormal skin rashes Lymphatics: Denies new lymphadenopathy or easy bruising Neurological:Denies numbness, tingling or new weaknesses Behavioral/Psych: Mood is stable, no new changes  All other systems were reviewed with the patient and are negative.  PHYSICAL EXAMINATION: ECOG PERFORMANCE STATUS: 0 - Asymptomatic  Vitals:   12/01/23 1305  BP: 116/64  Pulse: 64  Resp: (!) 21  Temp: 98.7 F (37.1 C)  SpO2: 95%   Filed Weights   12/01/23 1305  Weight: 276 lb 1.6 oz (125.2 kg)    GENERAL:alert, no distress and comfortable SKIN: skin color, texture, turgor are normal, no rashes or significant lesions EYES: normal, conjunctiva are pink and non-injected, sclera clear OROPHARYNX:no exudate, no erythema and lips, buccal mucosa, and tongue normal  NECK: supple, thyroid normal size, non-tender, without nodularity LYMPH:  no palpable lymphadenopathy in the cervical, axillary or inguinal LUNGS: clear to auscultation and percussion with normal breathing effort HEART: regular rate & rhythm and no murmurs and no lower extremity edema ABDOMEN:abdomen soft, non-tender  and normal bowel sounds Musculoskeletal:no cyanosis of digits and no clubbing  PSYCH: alert & oriented x 3 with fluent speech NEURO: no focal motor/sensory deficits BREAST: Right breast soft, no tenderness. Lumpy texture present, no mass felt. Bruising from biopsy on right breast.  No palpable adenopathy Physical Exam   LABORATORY DATA:  I have reviewed the data as listed    Latest Ref Rng & Units 12/01/2023   12:21 PM 11/19/2022    8:42 AM 05/27/2022    7:38 AM  CBC  WBC 4.0 - 10.5 K/uL 5.9  3.5  4.1   Hemoglobin 12.0 - 15.0 g/dL 57.8  46.9  62.9   Hematocrit 36.0 - 46.0 % 40.2  42.7  38.9   Platelets 150 - 400 K/uL 178  204  208     @cmpl @  RADIOGRAPHIC STUDIES: I have personally reviewed the radiological images as listed and agreed with the findings in the report. US  LT BREAST BX W LOC DEV 1ST LESION IMG BX SPEC US  GUIDE Addendum Date: 11/29/2023 ADDENDUM REPORT: 11/29/2023 08:51 ADDENDUM: Pathology revealed GRADE I INVASIVE MAMMARY CARCINOMA, NO SPECIAL TYPE (DUCTAL), CALCIFICATIONS: PRESENT of the LEFT breast, 9:00 o'clock, middle depth, 7cmfn, (ribbon clip). This was found to be concordant by Dr. Dru Georges. Pathology results were discussed with the patient by telephone. The patient reported doing well after the biopsy with tenderness at the site. Post biopsy instructions and care were reviewed and questions were answered. The patient was encouraged to call [facility] for any additional concerns. My direct phone number was provided. The patient was referred to [facility] on Dec 01, 2023. Pathology results reported by Kraig Peru, RN on 11/26/2023. Electronically Signed   By: Dru Georges M.D.   On: 11/29/2023 08:51   Result Date: 11/29/2023 CLINICAL DATA:  48 year old female with new 4 mm mass in the left breast medial upper quadrant at 9 o'clock 7 cm from the nipple, middle  depth. EXAM: ULTRASOUND GUIDED LEFT BREAST CORE NEEDLE BIOPSY COMPARISON:  Previous exam(s). PROCEDURE: I met with the patient and we discussed the procedure of ultrasound-guided biopsy, including benefits and alternatives. We discussed the high  likelihood of a successful procedure. We discussed the risks of the procedure, including infection, bleeding, tissue injury, clip migration, and inadequate sampling. Informed written consent was given. The usual time-out protocol was performed immediately prior to the procedure. Lesion quadrant: Left breast 9 o'clock 7 cm from the nipple Using sterile technique and 1% Lidocaine as local anesthetic, under direct ultrasound visualization, a 12 gauge spring-loaded device was used to perform biopsy of 4 mm mass using a caudocranial approach. At the conclusion of the procedure ribbon shaped tissue marker clip was deployed into the biopsy cavity. Follow up 2 view mammogram was performed and dictated separately. IMPRESSION: Ultrasound guided biopsy of 4 mm left breast mass at 9 o'clock middle depth with placement of a ribbon clip. No apparent complications. Electronically Signed: By: Dru Georges M.D. On: 11/25/2023 12:22   MM CLIP PLACEMENT LEFT Result Date: 11/25/2023 CLINICAL DATA:  48 year old female status post ultrasound-guided biopsy of a 4 mm mass left breast 9 o'clock middle depth with placement of a ribbon clip. EXAM: 3D DIAGNOSTIC LEFT MAMMOGRAM POST ULTRASOUND BIOPSY COMPARISON:  Previous exam(s). ACR Breast Density Category b: There are scattered areas of fibroglandular density. FINDINGS: 3D Mammographic images were obtained following ultrasound guided biopsy of a 4 mm mass in the left breast at 9 o'clock 7 cm from the nipple with placement of a ribbon clip. The biopsy marking clip is in expected position at the site of biopsy. IMPRESSION: Appropriate positioning of the ribbon shaped biopsy marking clip at the site of biopsy in the 9 o'clock position middle depth.  Final Assessment: Post Procedure Mammograms for Marker Placement Electronically Signed   By: Dru Georges M.D.   On: 11/25/2023 12:27   MM 3D DIAGNOSTIC MAMMOGRAM UNILATERAL LEFT BREAST Result Date: 11/25/2023 CLINICAL DATA:  Recall from screening mammography, possible asymmetry in the inner LEFT breast at middle depth. Family history of breast cancer in her mother who was diagnosed in her 64s. EXAM: DIGITAL DIAGNOSTIC UNILATERAL LEFT MAMMOGRAM WITH TOMOSYNTHESIS AND CAD; ULTRASOUND LEFT BREAST LIMITED TECHNIQUE: Left digital diagnostic mammography and breast tomosynthesis was performed. The images were evaluated with computer-aided detection. ; Targeted ultrasound examination of the left breast was performed. COMPARISON:  Previous exam(s). ACR Breast Density Category b: There are scattered areas of fibroglandular density. FINDINGS: Spot-compression CC and MLO views of the area of concern and a full field mediolateral view were obtained. Partially obscured isodense mass in the inner breast at middle depth, likely near 9 o'clock location, measuring approximately 0.4-0.5 cm, with associated faint round/punctate calcifications. No associated architectural distortion. Targeted ultrasound is performed, demonstrating a hypoechoic mass with vague and irregular margins at 9 o'clock 7 cm from the nipple measuring approximately 0.4 x 0.3 x 0.4 cm, demonstrating slight posterior acoustic shadowing and no internal power Doppler flow. Sonographic evaluation of the axilla demonstrates no pathologic lymphadenopathy. IMPRESSION: 1. Indeterminate 0.4 cm mass in the inner LEFT breast at 9 o'clock 7 cm from the nipple. 2. No pathologic LEFT axillary lymphadenopathy. RECOMMENDATION: Ultrasound-guided core needle biopsy of the indeterminate LEFT breast mass. I have discussed the findings and recommendations with the patient. The ultrasound core needle biopsy procedure was discussed with the patient and her questions were answered.  She wishes to proceed with the biopsy which will be scheduled by the [facility] staff. BI-RADS CATEGORY  4: Suspicious. Electronically Signed   By: Rinda Cheers M.D.   On: 11/25/2023 10:25   US  LIMITED ULTRASOUND INCLUDING AXILLA LEFT BREAST  Result  Date: 11/25/2023 CLINICAL DATA:  Recall from screening mammography, possible asymmetry in the inner LEFT breast at middle depth. Family history of breast cancer in her mother who was diagnosed in her 54s. EXAM: DIGITAL DIAGNOSTIC UNILATERAL LEFT MAMMOGRAM WITH TOMOSYNTHESIS AND CAD; ULTRASOUND LEFT BREAST LIMITED TECHNIQUE: Left digital diagnostic mammography and breast tomosynthesis was performed. The images were evaluated with computer-aided detection. ; Targeted ultrasound examination of the left breast was performed. COMPARISON:  Previous exam(s). ACR Breast Density Category b: There are scattered areas of fibroglandular density. FINDINGS: Spot-compression CC and MLO views of the area of concern and a full field mediolateral view were obtained. Partially obscured isodense mass in the inner breast at middle depth, likely near 9 o'clock location, measuring approximately 0.4-0.5 cm, with associated faint round/punctate calcifications. No associated architectural distortion. Targeted ultrasound is performed, demonstrating a hypoechoic mass with vague and irregular margins at 9 o'clock 7 cm from the nipple measuring approximately 0.4 x 0.3 x 0.4 cm, demonstrating slight posterior acoustic shadowing and no internal power Doppler flow. Sonographic evaluation of the axilla demonstrates no pathologic lymphadenopathy. IMPRESSION: 1. Indeterminate 0.4 cm mass in the inner LEFT breast at 9 o'clock 7 cm from the nipple. 2. No pathologic LEFT axillary lymphadenopathy. RECOMMENDATION: Ultrasound-guided core needle biopsy of the indeterminate LEFT breast mass. I have discussed the findings and recommendations with the patient. The ultrasound core  needle biopsy procedure was discussed with the patient and her questions were answered. She wishes to proceed with the biopsy which will be scheduled by the [facility] staff. BI-RADS CATEGORY  4: Suspicious. Electronically Signed   By: Rinda Cheers M.D.   On: 11/25/2023 10:25   MM 3D SCREENING MAMMOGRAM BILATERAL BREAST Result Date: 11/12/2023 CLINICAL DATA:  Screening. EXAM: DIGITAL SCREENING BILATERAL MAMMOGRAM WITH TOMOSYNTHESIS AND CAD TECHNIQUE: Bilateral screening digital craniocaudal and mediolateral oblique mammograms were obtained. Bilateral screening digital breast tomosynthesis was performed. The images were evaluated with computer-aided detection. COMPARISON:  Previous exam(s). ACR Breast Density Category b: There are scattered areas of fibroglandular density. FINDINGS: In the left breast, possible asymmetries warrant further evaluation. In the right breast, no findings suspicious for malignancy. IMPRESSION: Further evaluation is suggested for possible asymmetries in the left breast. RECOMMENDATION: Diagnostic mammogram and possibly ultrasound of the left breast. (Code:[id number]-[id number]) The patient will be contacted regarding the findings, and additional imaging will be scheduled. BI-RADS CATEGORY  0: Incomplete: Need additional imaging evaluation. Electronically Signed   By: Clancy Crimes M.D.   On: 11/12/2023 13:43   Assessment & Plan 48 year old female with Mirena IUD, presented with screening discovered left breast cancer   Malignant neoplasm of upper inner quadrant of left breast, invasive ductal carcinoma of left breast, [id number], stage 1A, ER+/PR+/HER2- Invasive ductal carcinoma of the left breast, stage 1A, identified through screening mammogram. Tumor is 4-5 mm, grade 1, ER 70% positive, PR 100% positive, HER2 negative by FISH, no lymphovascular invasion.  -Discussed treatment options including surgery, radiation, and antiestrogen therapy. Explained the role  of anti-estrogen therapy (tamoxifen) post-surgery. Discussed the role of Oncotype DX test, especially if the tumor more than 1 cm on surgical path, or more than 5 mm with grade 2 disease.  This will determine the benefit of adjuvant chemotherapy. -She was seen by radiation oncologist Dr. Eloise Hake today.  Plan for radiation therapy post-surgery - She has Mirena IUD.  Due to her ER positive breast cancer, I recommended her contact her gynecologist and have it removed.  I encouraged her to  use nonhormonal IUD or barrier method for contraceptive. - If she does not have menses return after IUD removal, then we will talk her hormone level to see if she is peri or postmenopausal.  Iron deficiency anemia Iron deficiency anemia likely related to gastric bypass surgery. Managed with ferrous sulfate supplementation every other day.  Vitamin B12 deficiency Vitamin B12 deficiency managed with supplementation.  Vitamin D  deficiency Vitamin D  deficiency managed with supplementation.  Anxiety Anxiety managed with Lexapro  30 mg daily. Discussed the potential stress related to cancer diagnosis and treatment. Offered counseling services, which she declined at this time but may consider in the future.  Obesity - Status post gastric bypass  Plan - Patient will proceed with lumpectomy and sentinel lymph node biopsy by Dr Alethea Andes soon.  -Oncotype Dx test if tumor more than 1 cm, or more than 5 mm with grade 2/3, on surgical sample. - Will see her at the end of her radiation, or sooner if needed.    No orders of the defined types were placed in this encounter.   All questions were answered. The patient knows to call the clinic with any problems, questions or concerns. I spent 35 minutes counseling the patient face to face. The total time spent in the appointment was 45 minute including review of chart and various tests results, discussions about plan of care and coordination of care plan.     Sonja Webster,  MD 12/02/2023 12:24 PM

## 2023-12-07 ENCOUNTER — Encounter: Payer: Self-pay | Admitting: *Deleted

## 2023-12-07 ENCOUNTER — Encounter: Payer: Self-pay | Admitting: Hematology

## 2023-12-07 ENCOUNTER — Telehealth: Payer: Self-pay | Admitting: *Deleted

## 2023-12-07 NOTE — Telephone Encounter (Signed)
 Spoke to pt concerning [facility] from 12/01/23. Denies questions or concerns regarding dx or treatment care plan. Encourage pt to call with needs. Received verbal understanding.  Pt interested in possibly of lumpectomy/reduction. Scheduled to see Dr. Orin Birk on 5/27 to discuss.

## 2023-12-08 ENCOUNTER — Other Ambulatory Visit: Payer: Self-pay | Admitting: Nurse Practitioner

## 2023-12-08 MED ORDER — DOXYCYCLINE HYCLATE 100 MG PO TABS: 100.0000 mg | ORAL_TABLET | Freq: Two times a day (BID) | ORAL | 0 refills | Status: DC

## 2023-12-08 MED ORDER — DOXYCYCLINE HYCLATE 100 MG PO TABS
100.0000 mg | ORAL_TABLET | Freq: Two times a day (BID) | ORAL | 0 refills | Status: DC
Start: 2023-12-08 — End: 2024-01-24

## 2023-12-08 MED ORDER — MUPIROCIN 2 % EX OINT: TOPICAL_OINTMENT | CUTANEOUS | 0 refills | Status: DC

## 2023-12-09 ENCOUNTER — Telehealth: Payer: Self-pay | Admitting: Genetic Counselor

## 2023-12-09 ENCOUNTER — Encounter: Payer: Self-pay | Admitting: General Practice

## 2023-12-09 ENCOUNTER — Encounter: Payer: Self-pay | Admitting: Genetic Counselor

## 2023-12-09 DIAGNOSIS — Z1379 Encounter for other screening for genetic and chromosomal anomalies: Secondary | ICD-10-CM | POA: Insufficient documentation

## 2023-12-09 NOTE — Telephone Encounter (Signed)
 LM on VM that results are back and to please call.  Left CB instructions.

## 2023-12-09 NOTE — Progress Notes (Signed)
 [facility] Spiritual Care Note  Left [facility] follow-up voicemail, encouraging return call.   [address] [place], [place], [facility] Pager [phone] Voicemail [phone]

## 2023-12-09 NOTE — Telephone Encounter (Signed)
 Patient called back.  Revealed negative genetic testing on BRCAPlus panel.  We will call when the remainder of the results come back.

## 2023-12-10 ENCOUNTER — Telehealth: Payer: Self-pay

## 2023-12-10 NOTE — Telephone Encounter (Signed)
 [employer] 2021-05 - Specimen Collection Study to Evaluate Biomarkers in Subjects with Cancer   Pt called today by research Rn to follow up on participation in this research study. Pt is still interested in participating. Research RN answered questions on insurance and privacy for this study. Pt was satisfied with answers. Pt will know more of her plan after seeing plastic surgery md on Tuesday and will make a plan to have blood drawn for this study with more information. Pt given contact information for research RN. Pt had no further questions or concerns.   Ramonia Burns BSN RN Clinical Research Nurse [facility] Direct Dial: [phone] 12/10/2023  10:29 AM

## 2023-12-14 ENCOUNTER — Ambulatory Visit (INDEPENDENT_AMBULATORY_CARE_PROVIDER_SITE_OTHER): Admitting: Plastic Surgery

## 2023-12-14 ENCOUNTER — Encounter: Payer: Self-pay | Admitting: Plastic Surgery

## 2023-12-14 VITALS — BP 120/62 | HR 73 | Ht 65.5 in | Wt 275.2 lb

## 2023-12-14 DIAGNOSIS — F3289 Other specified depressive episodes: Secondary | ICD-10-CM

## 2023-12-14 DIAGNOSIS — C50212 Malignant neoplasm of upper-inner quadrant of left female breast: Secondary | ICD-10-CM

## 2023-12-14 DIAGNOSIS — E88819 Insulin resistance, unspecified: Secondary | ICD-10-CM

## 2023-12-14 DIAGNOSIS — Z170 Estrogen receptor positive status [ER+]: Secondary | ICD-10-CM | POA: Diagnosis not present

## 2023-12-14 NOTE — Progress Notes (Signed)
 Patient ID: Rebecca Dominguez, female    DOB: [birth year], 48 y.o.   MRN: [id number]   Chief Complaint  Patient presents with   Consult   Breast Problem   Breast Cancer    The patient is a 48 year old female here for a consultation for breast reconstruction.  She is 5 feet 5 inches tall and weighs 275 pounds.  She is not a smoker and does not have diabetes.  Her sternal notch to nipple distance is 37 cm on the right and 35 cm on the left.  Her past medical history is positive for insulin  resistance, depression, low iron and her surgical history is positive for a cholecystectomy, excision of a pilonidal cyst, wisdom tooth extraction and a gastric bypass.  The patient was diagnosed with left sided breast cancer after a routine screening mammogram.  It is located on the inner aspect of the left breast.  The biopsy came back as a grade 1 invasive ductal carcinoma.  It is estrogen progesterone positive and HER2 negative with a Ki-67 of 1%.  She does have a strong family history of breast cancer.  She is interested in saving her breast if possible.    Review of Systems  Constitutional: Negative.   HENT: Negative.    Eyes: Negative.   Respiratory: Negative.    Cardiovascular: Negative.   Gastrointestinal: Negative.   Endocrine: Negative.   Genitourinary: Negative.   Musculoskeletal: Negative.   Skin: Negative.     Past Medical History:  Diagnosis Date   Anemia    low iron   Back pain    Breast mass    Family history of brain cancer    Family history of breast cancer    Family history of prostate cancer    Foot pain    from previous fracture of toe 2010   Gallbladder problem    removed   Headache    occasional   Hypertension    no longer on medications since weight loss   Joint pain    Obesity    Plantar fasciitis    Vitamin B 12 deficiency    Vitamin D  deficiency     Past Surgical History:  Procedure Laterality Date   BREAST BIOPSY Left 11/25/2023   US  LT BREAST BX W  LOC DEV 1ST LESION IMG BX SPEC US  GUIDE 11/25/2023 [facility] MAMMOGRAP[facility]   CHOLECYSTECTOMY  2013   COLONOSCOPY WITH PROPOFOL  N/A 03/06/2016   Procedure: COLONOSCOPY WITH PROPOFOL ;  Surgeon: Evangeline Hilts, MD;  Location: [facility] ENDOSCOPY;  Service: Endoscopy;  Laterality: N/A;   gastric bypass surgery  2001   PILONIDAL CYST EXCISION     WISDOM TOOTH EXTRACTION        Current Outpatient Medications:    aspirin-acetaminophen-caffeine (EXCEDRIN MIGRAINE) 250-250-65 MG tablet, Take 1 tablet by mouth every 6 (six) hours as needed for headache., Disp: , Rfl:    cyanocobalamin  (VITAMIN B12) 500 MCG tablet, Take 1 tablet (500 mcg total) by mouth daily., Disp: 30 tablet, Rfl: 2   doxycycline (VIBRA-TABS) 100 MG tablet, Take 1 tablet (100 mg total) by mouth 2 (two) times daily., Disp: 20 tablet, Rfl: 0   escitalopram  (LEXAPRO ) 10 MG tablet, Take 1 tablet (10 mg total) by mouth daily., Disp: 90 tablet, Rfl: 0   escitalopram  (LEXAPRO ) 20 MG tablet, Take 1 tablet (20 mg total) by mouth daily., Disp: 90 tablet, Rfl: 0   ferrous sulfate 325 (65 FE) MG tablet, Take 325 mg by mouth daily., Disp: ,  Rfl:    Glucosamine-Chondroit-Vit C-Mn (GLUCOSAMINE CHONDR 1500 COMPLX) CAPS, Take 1 capsule by mouth daily., Disp: , Rfl:    Multiple Vitamins-Minerals (MULTIVITAMIN WOMEN) TABS, Take 1 tablet by mouth daily., Disp: , Rfl:    mupirocin ointment (BACTROBAN) 2 %, Apply topically to effected area bid for next 10 days., Disp: 22 g, Rfl: 0   naproxen (NAPROSYN) 500 MG tablet, Take 500 mg by mouth 2 (two) times daily as needed. , Disp: , Rfl:    vitamin C (ASCORBIC ACID) 500 MG tablet, Take 500 mg by mouth daily., Disp: , Rfl:    levonorgestrel (MIRENA) 20 MCG/24HR IUD, 1 each by Intrauterine route once., Disp: , Rfl:    Objective:   Vitals:   12/14/23 1102  BP: 120/62  Pulse: 73  SpO2: 95%    Physical Exam Constitutional:      Appearance: Normal appearance.  HENT:     Head: Atraumatic.  Cardiovascular:     Rate and  Rhythm: Normal rate.     Pulses: Normal pulses.  Abdominal:     Palpations: Abdomen is soft.  Musculoskeletal:        General: Normal range of motion.  Skin:    General: Skin is warm.     Capillary Refill: Capillary refill takes less than 2 seconds.  Neurological:     Mental Status: She is alert and oriented to person, place, and time.  Psychiatric:        Mood and Affect: Mood normal.        Behavior: Behavior normal.        Thought Content: Thought content normal.        Judgment: Judgment normal.     Assessment & Plan:  Malignant neoplasm of upper-inner quadrant of left breast in female, estrogen receptor positive (HCC)  Insulin  resistance  Other depression  The options for reconstruction we explained to the patient / family for breast reconstruction.  There are two general categories of reconstruction.  We can reconstruction a breast with implants or use the patient's own tissue.  These were further discussed as listed.  Breast reconstruction is an optional procedure and eligibility depends on the full spectrum of the health of the patient and any co-morbidities.  More than one surgery is often needed to complete the reconstruction process.  The process can take three to twelve months to complete.  The breasts will not be identical due to many factors such as rib differences, shoulder asymmetry and treatments such as radiation.  The goal is to get the breasts to look normal and symmetrical in clothes.  Scars are a part of surgery and may fade some in time but will always be present under clothes.  Surgery may be an option on the non-cancer breast to achieve more symmetry.  No matter which procedure is chosen there is always the risk of complications and even failure of the body to heal.  This could result in no breast.    The options for reconstruction include:  1. Placement of a tissue expander with Acellular dermal matrix. When the expander is the desired size surgery is  performed to remove the expander and place an implant.  In some cases the implant can be placed without an expander.  2. Autologous reconstruction can include using a muscle or tissue from another area of the body to create a breast.  3. Combined procedures (ie. latissismus dorsi flap) can be done with an expander / implant placed under the muscle.  The risks, benefits, scars and recovery time were discussed for each of the above. Risks include bleeding, infection, hematoma, seroma, scarring, pain, wound healing complications, flap loss, fat necrosis, capsular contracture, need for implant removal, donor site complications, bulge, hernia, umbilical necrosis, need for urgent reoperation, and need for dressing changes.   The procedure the patient selected / that was best for the patient, was then discussed in further detail.  Total time: 45 minutes. This includes time spent with the patient during the visit as well as time spent before and after the visit reviewing the chart, documenting the encounter, making phone calls and reviewing studies.   The patient would like to go with a partial mastectomy of the left breast with oncoplastic bilateral breast reduction mastopexy.  I think this is reasonable and we should be able to do that at the time of the partial mastectomy so that she can move onto radiation.  The plan will be for bilateral oncoplastic breast reductions.  I have discussed the above information with Dr. Alethea Andes and reviewed the patient's chart myself particularly her oncology chart  Pictures were obtained of the patient and placed in the chart with the patient's or guardian's permission.   Lindaann Requena Bryceson Grape, DO

## 2023-12-15 ENCOUNTER — Encounter: Payer: Self-pay | Admitting: Plastic Surgery

## 2023-12-15 ENCOUNTER — Other Ambulatory Visit: Payer: Self-pay | Admitting: General Surgery

## 2023-12-15 DIAGNOSIS — Z170 Estrogen receptor positive status [ER+]: Secondary | ICD-10-CM

## 2023-12-16 ENCOUNTER — Telehealth: Payer: Self-pay

## 2023-12-16 NOTE — Telephone Encounter (Signed)
 [employer] 2021-05 - Specimen Collection Study to Evaluate Biomarkers in Subjects with Cancer   Research RN called pt to schedule her for consent and blood draw for this study. Pt had no questions or concerns about this study. Pt agreed to come in on June 5th at 3pm for consent and blood draw. She does has another appointment in [place] at 4:30pm. Pt knows she can call with any questions or concerns.   Ramonia Burns BSN RN Clinical Research Nurse [facility] Direct Dial: [phone] 12/16/2023  1:54 PM

## 2023-12-17 ENCOUNTER — Telehealth: Admitting: Plastic Surgery

## 2023-12-17 ENCOUNTER — Encounter: Payer: Self-pay | Admitting: Plastic Surgery

## 2023-12-17 DIAGNOSIS — Z170 Estrogen receptor positive status [ER+]: Secondary | ICD-10-CM

## 2023-12-17 DIAGNOSIS — C50212 Malignant neoplasm of upper-inner quadrant of left female breast: Secondary | ICD-10-CM

## 2023-12-17 NOTE — Progress Notes (Signed)
   Subjective:    Patient ID: Rebecca Dominguez, female    DOB: [birth year], 48 y.o.   MRN: [id number]  The patient is a 48 year old female joining me by phone for further discussion about her breast reconstruction.  She was recently diagnosed with grade 1 invasive ductal carcinoma.  It is estrogen and progesterone positive and HER2 negative.  She does have a strong family history of breast cancer.  She would like to have a partial mastectomy and reconstruction with bilateral breast reduction mastopexy.      Review of Systems     Objective:   Physical Exam      Assessment & Plan:     ICD-10-CM   1. Malignant neoplasm of upper-inner quadrant of left breast in female, estrogen receptor positive (HCC)  C50.212    Z17.0       I connected with  [name] on 12/17/23 by phone and verified that I am speaking with the correct person using two identifiers.  The patient was at home and I was at the office.  We spent 13 minutes in discussion.  Questions were answered from the patient and we continue with the plan of bilateral oncoplastic breast reductions.   I discussed the limitations of evaluation and management by telemedicine. The patient expressed understanding and agreed to proceed.

## 2023-12-21 ENCOUNTER — Encounter: Payer: Self-pay | Admitting: *Deleted

## 2023-12-23 ENCOUNTER — Ambulatory Visit: Payer: Self-pay | Admitting: Genetic Counselor

## 2023-12-23 ENCOUNTER — Encounter: Payer: Self-pay | Admitting: *Deleted

## 2023-12-23 ENCOUNTER — Inpatient Hospital Stay

## 2023-12-23 ENCOUNTER — Inpatient Hospital Stay: Attending: Hematology

## 2023-12-23 ENCOUNTER — Telehealth: Payer: Self-pay | Admitting: Genetic Counselor

## 2023-12-23 DIAGNOSIS — Z1379 Encounter for other screening for genetic and chromosomal anomalies: Secondary | ICD-10-CM

## 2023-12-23 DIAGNOSIS — Z170 Estrogen receptor positive status [ER+]: Secondary | ICD-10-CM

## 2023-12-23 LAB — RESEARCH LABS

## 2023-12-23 NOTE — Research (Signed)
 [employer] 2021-05 - Specimen Collection Study to Evaluate Biomarkers in Subjects with Cancer    This Nurse has reviewed this patient's inclusion and exclusion criteria as a second review and confirms Rebecca Dominguez is eligible for study participation.  Patient may continue with enrollment.   Dagmar Drones, RN, BSN, [facility] 12/23/2023 3:32 PM

## 2023-12-23 NOTE — Progress Notes (Signed)
 HPI:  Rebecca Dominguez was previously seen in the [facility] due to a personal and family history of cancer and concerns regarding a hereditary predisposition to cancer. Please refer to our prior cancer genetics clinic note for more information regarding our discussion, assessment and recommendations, at the time. Rebecca Dominguez recent genetic test results were disclosed to her, as were recommendations warranted by these results. These results and recommendations are discussed in more detail below.  CANCER HISTORY:  Oncology History  Malignant neoplasm of upper-inner quadrant of left female breast (HCC)  11/25/2023 Cancer Staging   Staging form: Breast, AJCC 8th Edition - Clinical stage from 11/25/2023: Stage IA (cT1a, cN0, cM0, G1, ER+, PR+, HER2-) - Signed by Sonja Georgetown, MD on 12/01/2023 Stage prefix: Initial diagnosis Histologic grading system: 3 grade system   11/29/2023 Initial Diagnosis   Malignant neoplasm of upper-inner quadrant of left female breast (HCC)   12/07/2023 Genetic Testing   Negative genetic testing on the BRCAPlus panel and CancerNext-Expanded+RNAinsight panel  LZTR1 VUS identified.  The report date is May 20, and Dec 16, 2023.    The CancerNext-Expanded gene panel offered by [facility] and includes sequencing, rearrangement, and RNA analysis for the following 77 genes: AIP, ALK, APC, ATM, BAP1, BARD1, BMPR1A, BRCA1, BRCA2, BRIP1, CDC73, CDH1, CDK4, CDKN1B, CDKN2A, CEBPA, CHEK2, CTNNA1, DDX41, DICER1, ETV6, FH, FLCN, GATA2, LZTR1, MAX, MBD4, MEN1, MET, MLH1, MSH2, MSH3, MSH6, MUTYH, NF1, NF2, NTHL1, PALB2, PHOX2B, PMS2, POT1, PRKAR1A, PTCH1, PTEN, RAD51C, RAD51D, RB1, RET, RPS20, RUNX1, SDHA, SDHAF2, SDHB, SDHC, SDHD, SMAD4, SMARCA4, SMARCB1, SMARCE1, STK11, SUFU, TMEM127, TP53, TSC1, TSC2, VHL, and WT1 (sequencing and deletion/duplication); AXIN2, CTNNA1, DDX41, EGFR, HOXB13, KIT, MBD4, MITF, MSH3, PDGFRA, POLD1 and POLE (sequencing only); EPCAM and GREM1  (deletion/duplication only). RNA data is routinely analyzed for use in variant interpretation for all genes. The BRCAPlus gene panel offered by [facility] and includes sequencing and rearrangement analysis for the following 13 genes: ATM, BARD1, BRCA1, BRCA2, CDH1, CHEK2, NF1, PALB2, PTEN, RAD51C, RAD51D, STK11 and TP53.     FAMILY HISTORY:  We obtained a detailed, 4-generation family history.  Significant diagnoses are listed below: Family History  Problem Relation Age of Onset   Obesity Mother    Breast cancer Mother 104   Colon cancer Father 33   Diabetes Father    Obesity Father    Throat cancer Paternal Uncle    Lung cancer Maternal Grandmother 60   Prostate cancer Maternal Grandfather        dx. late 27s   Cancer Paternal Grandmother        "female' cancer in her 75s-40s   Brain cancer Cousin 20       pat first cousin       The patient does not have children.  She has two sisters who are cancer free. One sister reportedly had negative genetic testing.  Her mother is living and her father is deceased.   Her father had colon cancer at 70.  He had two brothers and a sister, one brother had throat cancer.  This brother had a son who died of brain cancer at 11.  The paternal grandmother had a 'female' cancer.   The patient's mother had breast cancer at 35. She has one brother who is cancer free.  The maternal grandfather had prostate caner and the grandmother had lung cancer at 61.   Rebecca Dominguez is aware of previous family history of genetic testing for hereditary cancer risks. There is  no reported Ashkenazi Jewish ancestry. There is no known consanguinity.  GENETIC TEST RESULTS: Genetic testing reported out on Dec 16, 2023 through the CancerNext-Expanded+RNAinsight cancer panel found no pathogenic mutations. The CancerNext-Expanded gene panel offered by [facility] and includes sequencing, rearrangement, and RNA analysis for the following 77 genes: AIP, ALK, APC, ATM, BAP1,  BARD1, BMPR1A, BRCA1, BRCA2, BRIP1, CDC73, CDH1, CDK4, CDKN1B, CDKN2A, CEBPA, CHEK2, CTNNA1, DDX41, DICER1, ETV6, FH, FLCN, GATA2, LZTR1, MAX, MBD4, MEN1, MET, MLH1, MSH2, MSH3, MSH6, MUTYH, NF1, NF2, NTHL1, PALB2, PHOX2B, PMS2, POT1, PRKAR1A, PTCH1, PTEN, RAD51C, RAD51D, RB1, RET, RPS20, RUNX1, SDHA, SDHAF2, SDHB, SDHC, SDHD, SMAD4, SMARCA4, SMARCB1, SMARCE1, STK11, SUFU, TMEM127, TP53, TSC1, TSC2, VHL, and WT1 (sequencing and deletion/duplication); AXIN2, CTNNA1, DDX41, EGFR, HOXB13, KIT, MBD4, MITF, MSH3, PDGFRA, POLD1 and POLE (sequencing only); EPCAM and GREM1 (deletion/duplication only). RNA data is routinely analyzed for use in variant interpretation for all genes. The test report has been scanned into EPIC and is located under the Molecular Pathology section of the Results Review tab.  A portion of the result report is included below for reference.     We discussed with Rebecca Dominguez that because current genetic testing is not perfect, it is possible there may be a gene mutation in one of these genes that current testing cannot detect, but that chance is small.  We also discussed, that there could be another gene that has not yet been discovered, or that we have not yet tested, that is responsible for the cancer diagnoses in the family. It is also possible there is a hereditary cause for the cancer in the family that Rebecca Dominguez did not inherit and therefore was not identified in her testing.  Therefore, it is important to remain in touch with cancer genetics in the future so that we can continue to offer Rebecca Dominguez the most up to date genetic testing.   Genetic testing did identify a variant of uncertain significance (VUS) was identified in the LZTR1 gene called  p.D819A (c.2456A>C).  At this time, it is unknown if this variant is associated with increased cancer risk or if this is a normal finding, but most variants such as this get reclassified to being inconsequential. It should not be used to make  medical management decisions. With time, we suspect the lab will determine the significance of this variant, if any. If we do learn more about it, we will try to contact Rebecca Dominguez to discuss it further. However, it is important to stay in touch with us  periodically and keep the address and phone number up to date.  ADDITIONAL GENETIC TESTING: We discussed with Rebecca Dominguez that her genetic testing was fairly extensive.  If there are genes identified to increase cancer risk that can be analyzed in the future, we would be happy to discuss and coordinate this testing at that time.    CANCER SCREENING RECOMMENDATIONS: Rebecca Dominguez test result is considered negative (normal).  This means that we have not identified a hereditary cause for her personal and family history of cancer at this time. Most cancers happen by chance and this negative test suggests that her personal and family history of cancer may fall into this category.    Possible reasons for Rebecca Dominguez's negative genetic test include:  1. There may be a gene mutation in one of these genes that current testing methods cannot detect but that chance is small.  2. There could be another gene that has not yet been discovered, or that  we have not yet tested, that is responsible for the cancer diagnoses in the family.  3.  There may be no hereditary risk for cancer in the family. The cancers in Rebecca Dominguez and/or her family may be sporadic/familial or due to other genetic and environmental factors. 4. It is also possible there is a hereditary cause for the cancer in the family that Rebecca Dominguez did not inherit.  Therefore, it is recommended she continue to follow the cancer management and screening guidelines provided by her oncology and primary healthcare provider. An individual's cancer risk and medical management are not determined by genetic test results alone. Overall cancer risk assessment incorporates additional factors, including personal medical  history, family history, and any available genetic information that may result in a personalized plan for cancer prevention and surveillance  RECOMMENDATIONS FOR FAMILY MEMBERS:   Since she did not inherit a identifiable mutation in a cancer predisposition gene included on this panel, her children could not have inherited a known mutation from her in one of these genes. Individuals in this family might be at some increased risk of developing cancer, over the general population risk, simply due to the family history of cancer.  We recommended women in this family have a yearly mammogram beginning at age 72, or 46 years younger than the earliest onset of cancer, an annual clinical breast exam, and perform monthly breast self-exams. Women in this family should also have a gynecological exam as recommended by their primary provider. All family members should be referred for colonoscopy starting at age 96, or 39 years younger than the earliest onset of cancer.  FOLLOW-UP: Lastly, we discussed with Rebecca Dominguez that cancer genetics is a rapidly advancing field and it is possible that new genetic tests will be appropriate for her and/or her family members in the future. We encouraged her to remain in contact with cancer genetics on an annual basis so we can update her personal and family histories and let her know of advances in cancer genetics that may benefit this family.   Our contact number was provided. Rebecca Dominguez questions were answered to her satisfaction, and she knows she is welcome to call us  at anytime with additional questions or concerns.   Marijo Shove, MS, [facility] Licensed, Certified Genetic Counselor [email or web address]

## 2023-12-23 NOTE — Research (Signed)
 [employer] 2021-05 - Specimen Collection Study to Evaluate Biomarkers in Subjects with Cancer     Patient Rebecca Dominguez was identified by Dr.Feng as a potential candidate for the above listed study.  This Clinical Research Nurse met with Rebecca Dominguez, [id number] on 12/23/23 in a manner and location that ensures patient privacy to discuss participation in the above listed research study.  Patient is Unaccompanied.  Patient was previously provided with informed consent documents.  Patient confirmed they have read the informed consent documents.  As outlined in the informed consent form, this Nurse and Rakesha D Lampe discussed the purpose of the research study, the investigational nature of the study, study procedures and requirements for study participation, potential risks and benefits of study participation, as well as alternatives to participation.  This study is not blinded or double-blinded. The patient understands participation is voluntary and they may withdraw from study participation at any time.  This study does not involve randomization.  This study does not involve an investigational drug or device. This study does not involve a placebo. Patient understands enrollment is pending full eligibility review.   Confidentiality and how the patient's information will be used as part of study participation were discussed.  Patient was informed there is reimbursement provided for their time and effort spent on trial participation.  The patient is encouraged to discuss research study participation with their insurance provider to determine what costs they may incur as part of study participation, including research related injury.    All questions were answered to patient's satisfaction.  The informed consent with embedded HIPAA language was reviewed page by page.  The patient's mental and emotional status is appropriate to provide informed consent, and the patient verbalizes an  understanding of study participation.  Patient has agreed to participate in the above listed research study and has voluntarily signed the informed consent version 02 Aug 2020 revised 18 Aug 2021 with embedded HIPAA language, version 02 Aug 2020 revised 18 Aug 2021 on 12/23/23 at 3:15PM.  The patient was provided with a copy of the signed informed consent form with embedded HIPAA language for their reference.  No study specific procedures were obtained prior to the signing of the informed consent document.  Approximately 20 minutes were spent with the patient reviewing the informed consent documents.  Patient was not requested to complete a Release of Information form. Pt did ask if this will be billed to her insurance. Informed pt that this will not be billed to her insurance. She verbalized understanding and did not have further questions.    Eligibility: Eligibility criteria reviewed with patient. This nurse has reviewed this patient's inclusion and exclusion criteria and confirmed patient is eligible for study participation. Eligibility confirmed by treating investigator Dr.Feng, who also agrees that patient should proceed with enrollment. Patient will continue with enrollment.  Data Collection: Patient was interviewed to collect the following information.  Medical History:  High Blood Pressure  Yes Coronary Artery Disease No Lupus    No Rheumatoid Arthritis  No Diabetes   No      Lynch Syndrome  No  Is the patient currently taking a magnesium supplement?   No   Does the patient have a personal history of cancer (greater than 5 years ago)?  No   Does the patient have a family history of cancer in 1st or 2nd degree relatives? Yes If yes, Relationship(s) and Cancer type(s)?  1st. Mother-breast cancer, Father-colon cancer,  2nd. Maternal grandma - lung  cancer, maternal grandpa prostate with metastasis to spine and brain, paternal grandma - Female reproductive (uterine or ovarian), paternal  uncle throat cancer.   Does the patient have history of alcohol consumption? Yes   If yes, current or former? Current occasional drinker Drinks per week? 0. Pt states she drinks about 5 times a year for special occasions or celebrations.   Does the patient have history of cigarette, cigar, pipe, or chewing tobacco use?  No    Blood Collection: Research blood obtained by fresh venipuncture with research specialist. Patient tolerated well without any adverse events.  Gift Card: $50 gift card given to patient for her participation in this study.    Patient was thanked for their participation in this study.

## 2023-12-23 NOTE — Telephone Encounter (Signed)
 Revealed negative genetic testing.  Discussed that we do not know why she has breast cancer or why there is cancer in the family. It could be due to a different gene that we are not testing, or maybe our current technology may not be able to pick something up.  It will be important for her to keep in contact with genetics to keep up with whether additional testing may be needed.   2

## 2023-12-24 ENCOUNTER — Telehealth: Payer: Self-pay

## 2023-12-24 NOTE — Telephone Encounter (Signed)
 Notified Patient of completion of FMLA forms. Fax transmission confirmation received. Copy of forms emailed to Patient as requested. No other needs or concerns noted at this time.

## 2024-01-11 NOTE — Progress Notes (Unsigned)
 Patient ID: Rebecca Dominguez, female    DOB: 04/14/1976, 48 y.o.   MRN: [id number]  No chief complaint on file.   No diagnosis found.   History of Present Illness: Rebecca Dominguez is a 48 y.o.  female  with a history of left breast cancer.  She presents for preoperative evaluation for upcoming procedure, bilateral oncoplastic reduction at time of left lumpectomy/partial mastectomy, scheduled for 01/31/2024 with Dr. Lowery and Dr. Curvin.  The patient {HAS HAS WNU:81165} had problems with anesthesia. ***  Summary of Previous Visit: Video encounter with Dr. Lowery 12/17/2023 have ongoing discussions about breast reconstruction following partial mastectomy/lumpectomy for left breast cancer.  Discussed options and patient would like to proceed with bilateral oncoplastic reduction at time of her lumpectomy.  Discussed risks of surgery and patient was agreeable to proceed.  Job: ***  PMH Significant for: Left breast cancer, obesity, anxiety and depression, anemia   Past Medical History: Allergies: No Known Allergies  Current Medications:  Current Outpatient Medications:    aspirin-acetaminophen-caffeine (EXCEDRIN MIGRAINE) 250-250-65 MG tablet, Take 1 tablet by mouth every 6 (six) hours as needed for headache., Disp: , Rfl:    cyanocobalamin  (VITAMIN B12) 500 MCG tablet, Take 1 tablet (500 mcg total) by mouth daily., Disp: 30 tablet, Rfl: 2   doxycycline  (VIBRA -TABS) 100 MG tablet, Take 1 tablet (100 mg total) by mouth 2 (two) times daily., Disp: 20 tablet, Rfl: 0   escitalopram  (LEXAPRO ) 10 MG tablet, Take 1 tablet (10 mg total) by mouth daily., Disp: 90 tablet, Rfl: 0   escitalopram  (LEXAPRO ) 20 MG tablet, Take 1 tablet (20 mg total) by mouth daily., Disp: 90 tablet, Rfl: 0   ferrous sulfate 325 (65 FE) MG tablet, Take 325 mg by mouth daily., Disp: , Rfl:    Glucosamine-Chondroit-Vit C-Mn (GLUCOSAMINE CHONDR 1500 COMPLX) CAPS, Take 1 capsule by mouth daily., Disp: , Rfl:     levonorgestrel (MIRENA) 20 MCG/24HR IUD, 1 each by Intrauterine route once., Disp: , Rfl:    Multiple Vitamins-Minerals (MULTIVITAMIN WOMEN) TABS, Take 1 tablet by mouth daily., Disp: , Rfl:    mupirocin  ointment (BACTROBAN ) 2 %, Apply topically to effected area bid for next 10 days., Disp: 22 g, Rfl: 0   naproxen (NAPROSYN) 500 MG tablet, Take 500 mg by mouth 2 (two) times daily as needed. , Disp: , Rfl:    vitamin C (ASCORBIC ACID) 500 MG tablet, Take 500 mg by mouth daily., Disp: , Rfl:   Past Medical Problems: Past Medical History:  Diagnosis Date   Anemia    low iron   Back pain    Breast mass    Family history of brain cancer    Family history of breast cancer    Family history of prostate cancer    Foot pain    from previous fracture of toe 2010   Gallbladder problem    removed   Headache    occasional   Hypertension    no longer on medications since weight loss   Joint pain    Obesity    Plantar fasciitis    Vitamin B 12 deficiency    Vitamin D  deficiency     Past Surgical History: Past Surgical History:  Procedure Laterality Date   BREAST BIOPSY Left 11/25/2023   US  LT BREAST BX W LOC DEV 1ST LESION IMG BX SPEC US  GUIDE 11/25/2023 [facility] MAMMOGRAPHY   CHOLECYSTECTOMY  2013   COLONOSCOPY WITH PROPOFOL  N/A 03/06/2016   Procedure:  COLONOSCOPY WITH PROPOFOL ;  Surgeon: Elsie Cree, MD;  Location: [facility] ENDOSCOPY;  Service: Endoscopy;  Laterality: N/A;   gastric bypass surgery  2001   PILONIDAL CYST EXCISION     WISDOM TOOTH EXTRACTION      Social History: Social History   Socioeconomic History   Marital status: Married    Spouse name: Sophya Vanblarcom   Number of children: 0   Years of education: Not on file   Highest education level: Not on file  Occupational History   Occupation: Customer Service  Tobacco Use   Smoking status: Never   Smokeless tobacco: Never  Substance and Sexual Activity   Alcohol use: Yes    Comment: rare/special occasion   Drug use: No    Sexual activity: Not on file  Other Topics Concern   Not on file  Social History Narrative   Not on file   Social Drivers of Health   Financial Resource Strain: Not on file  Food Insecurity: No Food Insecurity (12/01/2023)   Hunger Vital Sign    Worried About Running Out of Food in the Last Year: Never true    Ran Out of Food in the Last Year: Never true  Transportation Needs: No Transportation Needs (12/01/2023)   PRAPARE - Administrator, Civil Service (Medical): No    Lack of Transportation (Non-Medical): No  Physical Activity: Not on file  Stress: Not on file  Social Connections: Not on file  Intimate Partner Violence: Not At Risk (12/01/2023)   Humiliation, Afraid, Rape, and Kick questionnaire    Fear of Current or Ex-Partner: No    Emotionally Abused: No    Physically Abused: No    Sexually Abused: No    Family History: Family History  Problem Relation Age of Onset   Obesity Mother    Breast cancer Mother 11   Colon cancer Father 13   Diabetes Father    Obesity Father    Throat cancer Paternal Uncle    Lung cancer Maternal Grandmother 45   Prostate cancer Maternal Grandfather        dx. late 23s   Cancer Paternal Grandmother        female' cancer in her 58s-40s   Brain cancer Cousin 20       pat first cousin    Review of Systems: ROS  Physical Exam: Vital Signs There were no vitals taken for this visit.  Physical Exam *** Constitutional:      General: Not in acute distress.    Appearance: Normal appearance. Not ill-appearing.  HENT:     Head: Normocephalic and atraumatic.  Eyes:     Pupils: Pupils are equal, round. Cardiovascular:     Rate and Rhythm: Normal rate.    Pulses: Normal pulses.  Pulmonary:     Effort: No respiratory distress or increased work of breathing.  Speaks in full sentences. Abdominal:     General: Abdomen is flat. No distension.   Musculoskeletal: Normal range of motion. No lower extremity swelling or edema. No  varicosities. *** Skin:    General: Skin is warm and dry.     Findings: No erythema or rash.  Neurological:     Mental Status: Alert and oriented to person, place, and time.  Psychiatric:        Mood and Affect: Mood normal.        Behavior: Behavior normal.    Assessment/Plan: The patient is scheduled for bilateral oncoplastic reduction at time of left partial  mastectomy/lumpectomy with Dr. Lowery.  Risks, benefits, and alternatives of procedure discussed, questions answered and consent obtained.    Smoking Status: ***; Counseling Given? ***  Last Mammogram: 11/2023; Results: Biopsy-proven breast cancer left breast at 9 o'clock position, 7 cm from nipple.  Caprini Score: ***; Risk Factors include: ***, BMI *** 25, and length of planned surgery. Recommendation for mechanical *** pharmacological prophylaxis. Encourage early ambulation.   Pictures obtained: 12/14/2023  Post-op Rx sent to pharmacy: ***  Patient was provided with the General Surgical Risk consent document and Pain Medication Agreement prior to their appointment.  They had adequate time to read through the risk consent documents and Pain Medication Agreement. We also discussed them in person together during this preop appointment. All of their questions were answered to their satisfaction.  Recommended calling if they have any further questions.  Risk consent form and Pain Medication Agreement to be scanned into patient's chart.  The risk that can be encountered with breast reduction were discussed and include the following but not limited to these:  Breast asymmetry, fluid accumulation, firmness of the breast, inability to breast feed, loss of nipple or areola, skin loss, decrease or no nipple sensation, fat necrosis of the breast tissue, bleeding, infection, healing delay.  There are risks of anesthesia, changes to skin sensation and injury to nerves or blood vessels.  The muscle can be temporarily or permanently injured.   You may have an allergic reaction to tape, suture, glue, blood products which can result in skin discoloration, swelling, pain, skin lesions, poor healing.  Any of these can lead to the need for revisonal surgery or stage procedures.  A reduction has potential to interfere with diagnostic procedures.  Nipple or breast piercing can increase risks of infection.  This procedure is best done when the breast is fully developed.  Changes in the breast will continue to occur over time.  Pregnancy can alter the outcomes of previous breast reduction surgery, weight gain and weigh loss can also effect the long term appearance.     Electronically signed by: Honora Seip, PA-C 01/11/2024 3:28 PM

## 2024-01-11 NOTE — H&P (View-Only) (Signed)
 Patient ID: Rebecca Dominguez, female    DOB: 04/14/1976, 48 y.o.   MRN: [id number]  No chief complaint on file.   No diagnosis found.   History of Present Illness: Rebecca Dominguez is a 48 y.o.  female  with a history of left breast cancer.  She presents for preoperative evaluation for upcoming procedure, bilateral oncoplastic reduction at time of left lumpectomy/partial mastectomy, scheduled for 01/31/2024 with Dr. Lowery and Dr. Curvin.  The patient {HAS HAS [id number]:81165} had problems with anesthesia. ***  Summary of Previous Visit: Video encounter with Dr. Lowery 12/17/2023 have ongoing discussions about breast reconstruction following partial mastectomy/lumpectomy for left breast cancer.  Discussed options and patient would like to proceed with bilateral oncoplastic reduction at time of her lumpectomy.  Discussed risks of surgery and patient was agreeable to proceed.  Job: ***  PMH Significant for: Left breast cancer, obesity, anxiety and depression, anemia   Past Medical History: Allergies: No Known Allergies  Current Medications:  Current Outpatient Medications:    aspirin-acetaminophen-caffeine (EXCEDRIN MIGRAINE) 250-250-65 MG tablet, Take 1 tablet by mouth every 6 (six) hours as needed for headache., Disp: , Rfl:    cyanocobalamin  (VITAMIN B12) 500 MCG tablet, Take 1 tablet (500 mcg total) by mouth daily., Disp: 30 tablet, Rfl: 2   doxycycline  (VIBRA -TABS) 100 MG tablet, Take 1 tablet (100 mg total) by mouth 2 (two) times daily., Disp: 20 tablet, Rfl: 0   escitalopram  (LEXAPRO ) 10 MG tablet, Take 1 tablet (10 mg total) by mouth daily., Disp: 90 tablet, Rfl: 0   escitalopram  (LEXAPRO ) 20 MG tablet, Take 1 tablet (20 mg total) by mouth daily., Disp: 90 tablet, Rfl: 0   ferrous sulfate 325 (65 FE) MG tablet, Take 325 mg by mouth daily., Disp: , Rfl:    Glucosamine-Chondroit-Vit C-Mn (GLUCOSAMINE CHONDR 1500 COMPLX) CAPS, Take 1 capsule by mouth daily., Disp: , Rfl:     levonorgestrel (MIRENA) 20 MCG/24HR IUD, 1 each by Intrauterine route once., Disp: , Rfl:    Multiple Vitamins-Minerals (MULTIVITAMIN WOMEN) TABS, Take 1 tablet by mouth daily., Disp: , Rfl:    mupirocin  ointment (BACTROBAN ) 2 %, Apply topically to effected area bid for next 10 days., Disp: 22 g, Rfl: 0   naproxen (NAPROSYN) 500 MG tablet, Take 500 mg by mouth 2 (two) times daily as needed. , Disp: , Rfl:    vitamin C (ASCORBIC ACID) 500 MG tablet, Take 500 mg by mouth daily., Disp: , Rfl:   Past Medical Problems: Past Medical History:  Diagnosis Date   Anemia    low iron   Back pain    Breast mass    Family history of brain cancer    Family history of breast cancer    Family history of prostate cancer    Foot pain    from previous fracture of toe 2010   Gallbladder problem    removed   Headache    occasional   Hypertension    no longer on medications since weight loss   Joint pain    Obesity    Plantar fasciitis    Vitamin B 12 deficiency    Vitamin D  deficiency     Past Surgical History: Past Surgical History:  Procedure Laterality Date   BREAST BIOPSY Left 11/25/2023   US  LT BREAST BX W LOC DEV 1ST LESION IMG BX SPEC US  GUIDE 11/25/2023 [facility]AMMOGRAP[facility]   CHOLECYSTECTOMY  2013   COLONOSCOPY WITH PROPOFOL  N/A 03/06/2016   Procedure:  COLONOSCOPY WITH PROPOFOL ;  Surgeon: Elsie Cree, MD;  Location: [facility] ENDOSCOPY;  Service: Endoscopy;  Laterality: N/A;   gastric bypass surgery  2001   PILONIDAL CYST EXCISION     WISDOM TOOTH EXTRACTION      Social History: Social History   Socioeconomic History   Marital status: Married    Spouse name: Rebecca Dominguez   Number of children: 0   Years of education: Not on file   Highest education level: Not on file  Occupational History   Occupation: Customer Service  Tobacco Use   Smoking status: Never   Smokeless tobacco: Never  Substance and Sexual Activity   Alcohol use: Yes    Comment: rare/special occasion   Drug use: No    Sexual activity: Not on file  Other Topics Concern   Not on file  Social History Narrative   Not on file   Social Drivers of Health   Financial Resource Strain: Not on file  Food Insecurity: No Food Insecurity (12/01/2023)   Hunger Vital Sign    Worried About Running Out of Food in the Last Year: Never true    Ran Out of Food in the Last Year: Never true  Transportation Needs: No Transportation Needs (12/01/2023)   PRAPARE - Administrator, Civil Service (Medical): No    Lack of Transportation (Non-Medical): No  Physical Activity: Not on file  Stress: Not on file  Social Connections: Not on file  Intimate Partner Violence: Not At Risk (12/01/2023)   Humiliation, Afraid, Rape, and Kick questionnaire    Fear of Current or Ex-Partner: No    Emotionally Abused: No    Physically Abused: No    Sexually Abused: No    Family History: Family History  Problem Relation Age of Onset   Obesity Mother    Breast cancer Mother 11   Colon cancer Father 13   Diabetes Father    Obesity Father    Throat cancer Paternal Uncle    Lung cancer Maternal Grandmother 45   Prostate cancer Maternal Grandfather        dx. late 23s   Cancer Paternal Grandmother        female' cancer in her 58s-40s   Brain cancer Cousin 20       pat first cousin    Review of Systems: ROS  Physical Exam: Vital Signs There were no vitals taken for this visit.  Physical Exam *** Constitutional:      General: Not in acute distress.    Appearance: Normal appearance. Not ill-appearing.  HENT:     Head: Normocephalic and atraumatic.  Eyes:     Pupils: Pupils are equal, round. Cardiovascular:     Rate and Rhythm: Normal rate.    Pulses: Normal pulses.  Pulmonary:     Effort: No respiratory distress or increased work of breathing.  Speaks in full sentences. Abdominal:     General: Abdomen is flat. No distension.   Musculoskeletal: Normal range of motion. No lower extremity swelling or edema. No  varicosities. *** Skin:    General: Skin is warm and dry.     Findings: No erythema or rash.  Neurological:     Mental Status: Alert and oriented to person, place, and time.  Psychiatric:        Mood and Affect: Mood normal.        Behavior: Behavior normal.    Assessment/Plan: The patient is scheduled for bilateral oncoplastic reduction at time of left partial  mastectomy/lumpectomy with Dr. Lowery.  Risks, benefits, and alternatives of procedure discussed, questions answered and consent obtained.    Smoking Status: ***; Counseling Given? ***  Last Mammogram: 11/2023; Results: Biopsy-proven breast cancer left breast at 9 o'clock position, 7 cm from nipple.  Caprini Score: ***; Risk Factors include: ***, BMI *** 25, and length of planned surgery. Recommendation for mechanical *** pharmacological prophylaxis. Encourage early ambulation.   Pictures obtained: 12/14/2023  Post-op Rx sent to pharmacy: ***  Patient was provided with the General Surgical Risk consent document and Pain Medication Agreement prior to their appointment.  They had adequate time to read through the risk consent documents and Pain Medication Agreement. We also discussed them in person together during this preop appointment. All of their questions were answered to their satisfaction.  Recommended calling if they have any further questions.  Risk consent form and Pain Medication Agreement to be scanned into patient's chart.  The risk that can be encountered with breast reduction were discussed and include the following but not limited to these:  Breast asymmetry, fluid accumulation, firmness of the breast, inability to breast feed, loss of nipple or areola, skin loss, decrease or no nipple sensation, fat necrosis of the breast tissue, bleeding, infection, healing delay.  There are risks of anesthesia, changes to skin sensation and injury to nerves or blood vessels.  The muscle can be temporarily or permanently injured.   You may have an allergic reaction to tape, suture, glue, blood products which can result in skin discoloration, swelling, pain, skin lesions, poor healing.  Any of these can lead to the need for revisonal surgery or stage procedures.  A reduction has potential to interfere with diagnostic procedures.  Nipple or breast piercing can increase risks of infection.  This procedure is best done when the breast is fully developed.  Changes in the breast will continue to occur over time.  Pregnancy can alter the outcomes of previous breast reduction surgery, weight gain and weigh loss can also effect the long term appearance.     Electronically signed by: Honora Seip, PA-C 01/11/2024 3:28 PM

## 2024-01-12 ENCOUNTER — Ambulatory Visit (INDEPENDENT_AMBULATORY_CARE_PROVIDER_SITE_OTHER): Admitting: Physician Assistant

## 2024-01-12 VITALS — BP 129/71 | HR 82 | Wt 278.0 lb

## 2024-01-12 DIAGNOSIS — C50212 Malignant neoplasm of upper-inner quadrant of left female breast: Secondary | ICD-10-CM

## 2024-01-12 DIAGNOSIS — Z170 Estrogen receptor positive status [ER+]: Secondary | ICD-10-CM

## 2024-01-12 MED ORDER — CEPHALEXIN 500 MG PO CAPS: 500.0000 mg | ORAL_CAPSULE | Freq: Four times a day (QID) | ORAL | 0 refills | Status: AC

## 2024-01-12 MED ORDER — OXYCODONE HCL 5 MG PO TABS: 5.0000 mg | ORAL_TABLET | Freq: Three times a day (TID) | ORAL | 0 refills | Status: AC | PRN

## 2024-01-12 MED ORDER — ONDANSETRON 4 MG PO TBDP: 4.0000 mg | ORAL_TABLET | Freq: Three times a day (TID) | ORAL | 0 refills | Status: DC | PRN

## 2024-01-13 ENCOUNTER — Encounter (INDEPENDENT_AMBULATORY_CARE_PROVIDER_SITE_OTHER): Admitting: Family Medicine

## 2024-01-13 ENCOUNTER — Encounter (INDEPENDENT_AMBULATORY_CARE_PROVIDER_SITE_OTHER): Payer: Self-pay | Admitting: Family Medicine

## 2024-01-13 VITALS — Ht 65.0 in

## 2024-01-13 DIAGNOSIS — Z9884 Bariatric surgery status: Secondary | ICD-10-CM | POA: Insufficient documentation

## 2024-01-17 ENCOUNTER — Ambulatory Visit (INDEPENDENT_AMBULATORY_CARE_PROVIDER_SITE_OTHER): Admitting: Family Medicine

## 2024-01-17 ENCOUNTER — Encounter (INDEPENDENT_AMBULATORY_CARE_PROVIDER_SITE_OTHER): Payer: Self-pay | Admitting: Family Medicine

## 2024-01-17 VITALS — BP 121/78 | HR 83 | Temp 98.1°F | Ht 65.0 in | Wt 271.0 lb

## 2024-01-17 DIAGNOSIS — C50212 Malignant neoplasm of upper-inner quadrant of left female breast: Secondary | ICD-10-CM

## 2024-01-17 DIAGNOSIS — Z170 Estrogen receptor positive status [ER+]: Secondary | ICD-10-CM

## 2024-01-17 DIAGNOSIS — E669 Obesity, unspecified: Secondary | ICD-10-CM | POA: Diagnosis not present

## 2024-01-17 DIAGNOSIS — F5089 Other specified eating disorder: Secondary | ICD-10-CM | POA: Diagnosis not present

## 2024-01-17 DIAGNOSIS — Z6841 Body Mass Index (BMI) 40.0 and over, adult: Secondary | ICD-10-CM

## 2024-01-17 DIAGNOSIS — F3289 Other specified depressive episodes: Secondary | ICD-10-CM

## 2024-01-17 MED ORDER — ESCITALOPRAM OXALATE 10 MG PO TABS
10.0000 mg | ORAL_TABLET | Freq: Every day | ORAL | 0 refills | Status: DC
Start: 2024-01-17 — End: 2024-04-17

## 2024-01-17 MED ORDER — ESCITALOPRAM OXALATE 20 MG PO TABS: 20.0000 mg | ORAL_TABLET | Freq: Every day | ORAL | 0 refills | Status: DC

## 2024-01-17 NOTE — Progress Notes (Signed)
 Office: [phone]  /  Fax: [phone]  WEIGHT SUMMARY AND BIOMETRICS  Anthropometric Measurements Height: 5' 5 (1.651 m) Weight: 271 lb (122.9 kg) BMI (Calculated): 45.1 Weight at Last Visit: 271 lb Weight Lost Since Last Visit: 0 Weight Gained Since Last Visit: 0 Starting Weight: 338 lb Total Weight Loss (lbs): 67 lb (30.4 kg) Peak Weight: 358 lb   Body Composition  Body Fat %: 54.6 % Fat Mass (lbs): 148 lbs Muscle Mass (lbs): 116.8 lbs Visceral Fat Rating : 18   Other Clinical Data Fasting: No Labs: No Today's Visit #: 47 Starting Date: 09/12/18    Chief Complaint: OBESITY   Discussed the use of AI scribe software for clinical note transcription with the patient, who gave verbal consent to proceed.  History of Present Illness Rebecca Dominguez is a 48 year old female with obesity and breast cancer who presents for obesity treatment and progress assessment.  She is currently adhering to her category three plan about fifty percent of the time and is attempting to increase her physical activity by taking more steps. Despite these efforts, her weight has remained stable over the last six weeks. She is taking Lexapro , 30 mg daily, to help manage emotional eating behaviors.  She is scheduled for breast cancer surgery in two weeks, which will include an oncoplastic breast lift and slight reduction. The cancer is located in the inner part of her breast at the nine o'clock position. Initially, she felt shock and fear but is now eager to proceed with the surgery. She is concerned about the potential need for chemotherapy and the effects of radiation, particularly fatigue and skin changes, and plans to discuss these concerns with her oncologist.  She had a comprehensive clinic visit on May 14th, meeting with various specialists including an oncologist, breast surgeon, and radiation oncologist. Her husband attended this visit for support and to help with questions. She has  a background in Programmer, applications, which aids her understanding of medical processes.  She has a history of gastric bypass surgery in 2001 and subsequent gallbladder removal. She is concerned about the surgical scar and is focusing on nutrition, particularly high protein intake, to aid recovery and manage radiation fatigue. She is exploring indoor exercise options due to the heat and plans to use online resources for walking exercises.  She is managing her appointments around her work schedule and has several follow-up appointments scheduled post-surgery. She is trying to maintain a positive outlook and is preparing meal plans to support her health during treatment. No chest pain, shortness of breath, or palpitations.      PHYSICAL EXAM:  Blood pressure 121/78, pulse 83, temperature 98.1 F (36.7 C), height 5' 5 (1.651 m), weight 271 lb (122.9 kg), SpO2 96%. Body mass index is 45.1 kg/m.  DIAGNOSTIC DATA REVIEWED:  BMET    Component Value Date/Time   NA 140 12/01/2023 1221   NA 141 03/31/2023 0829   K 4.7 12/01/2023 1221   CL 109 12/01/2023 1221   CO2 28 12/01/2023 1221   GLUCOSE 86 12/01/2023 1221   BUN 23 (H) 12/01/2023 1221   BUN 15 03/31/2023 0829   CREATININE 0.61 12/01/2023 1221   CALCIUM 9.5 12/01/2023 1221   GFRNONAA >60 12/01/2023 1221   GFRAA 115 12/27/2019 1150   Lab Results  Component Value Date   HGBA1C 5.3 03/31/2023   HGBA1C 5.3 09/12/2018   Lab Results  Component Value Date   INSULIN  5.5 03/31/2023   INSULIN  8.8 09/12/2018  Lab Results  Component Value Date   TSH 2.210 11/19/2022   CBC    Component Value Date/Time   WBC 5.9 12/01/2023 1221   RBC 4.43 12/01/2023 1221   HGB 13.8 12/01/2023 1221   HGB 13.5 11/19/2022 0842   HCT 40.2 12/01/2023 1221   HCT 42.7 11/19/2022 0842   PLT 178 12/01/2023 1221   PLT 204 11/19/2022 0842   MCV 90.7 12/01/2023 1221   MCV 94 11/19/2022 0842   MCH 31.2 12/01/2023 1221   MCHC 34.3 12/01/2023 1221   RDW 12.9  12/01/2023 1221   RDW 13.3 11/19/2022 0842   Iron Studies    Component Value Date/Time   IRON 54 06/05/2020 0000   TIBC 441 06/05/2020 0000   FERRITIN 17 09/12/2018 0957   IRONPCTSAT 12 06/05/2020 0000   Lipid Panel     Component Value Date/Time   CHOL 155 11/19/2022 0842   TRIG 36 11/19/2022 0842   HDL 73 11/19/2022 0842   CHOLHDL 2.3 01/28/2021 1115   LDLCALC 73 11/19/2022 0842   Hepatic Function Panel     Component Value Date/Time   PROT 6.6 12/01/2023 1221   PROT 6.0 03/31/2023 0829   ALBUMIN 4.0 12/01/2023 1221   ALBUMIN 3.9 03/31/2023 0829   AST 25 12/01/2023 1221   ALT 28 12/01/2023 1221   ALKPHOS 58 12/01/2023 1221   BILITOT 0.4 12/01/2023 1221      Component Value Date/Time   TSH 2.210 11/19/2022 0842   Nutritional Lab Results  Component Value Date   VD25OH 53.4 03/31/2023   VD25OH 36.3 11/19/2022   VD25OH 41.1 05/27/2022     Assessment and Plan Assessment & Plan Breast Cancer Diagnosed with breast cancer, scheduled for oncoplastic breast lift and slight reduction in two weeks. Cancer located at the nine o'clock position on the breast. Potential need for chemotherapy and radiation therapy post-surgery, contingent on lymph node involvement. She is anxious about radiation side effects, including skin changes and fatigue, and plans to discuss these with her oncologist. Informed about the importance of high protein and antioxidant-rich nutrition for recovery and managing radiation fatigue. - Proceed with scheduled surgery in two weeks - Discuss chemotherapy and radiation concerns with oncologist - Emphasize nutrition, including high protein and antioxidants, to aid recovery and manage radiation fatigue - Encourage communication with healthcare team regarding treatment concerns  Obesity and Emotional Eating Behaviors Adhering to category three plan about fifty percent of the time, attempting to increase physical activity. Weight maintained over the last six  weeks. Currently on Lexapro  for emotional eating behaviors. - Continue Lexapro  30 mg daily - Encourage adherence to weight management plan - Promote increased physical activity, including indoor walking videos by Sonny Rockford  General Health Maintenance Focusing on improving nutrition and physical activity for recovery and overall health. Discussed importance of antioxidants and high protein intake. - Incorporate brightly colored vegetables and fruits rich in antioxidants, such as purple cabbage and blueberries - Maintain high protein diet - Engage in regular physical activity, including indoor walking  Follow-up Multiple follow-up appointments scheduled post-surgery to monitor recovery and treatment progress. - Follow-up with plastic surgeon on August 5 - Follow-up with breast surgeon on August 6 - Follow-up with oncologist on August 7 - Next appointment with family practice on August 6 at 7:40 AM      She was informed of the importance of frequent follow up visits to maximize her success with intensive lifestyle modifications for her multiple health conditions.  Louann Penton, MD

## 2024-01-24 ENCOUNTER — Encounter (HOSPITAL_BASED_OUTPATIENT_CLINIC_OR_DEPARTMENT_OTHER): Payer: Self-pay | Admitting: General Surgery

## 2024-01-24 ENCOUNTER — Other Ambulatory Visit: Payer: Self-pay

## 2024-01-24 DIAGNOSIS — Z719 Counseling, unspecified: Secondary | ICD-10-CM

## 2024-01-26 NOTE — Progress Notes (Signed)
 error

## 2024-01-28 ENCOUNTER — Ambulatory Visit
Admission: RE | Admit: 2024-01-28 | Discharge: 2024-01-28 | Disposition: A | Discharge status: Home / Self Care | Source: Ambulatory Visit | Attending: General Surgery | Admitting: General Surgery

## 2024-01-28 DIAGNOSIS — Z170 Estrogen receptor positive status [ER+]: Secondary | ICD-10-CM

## 2024-01-28 HISTORY — PX: BREAST BIOPSY: SHX20

## 2024-01-28 MED ORDER — CHLORHEXIDINE GLUCONATE CLOTH 2 % EX PADS: 6.0000 | MEDICATED_PAD | Freq: Once | CUTANEOUS | Status: DC

## 2024-01-28 NOTE — Progress Notes (Signed)

## 2024-01-31 ENCOUNTER — Other Ambulatory Visit: Payer: Self-pay

## 2024-01-31 ENCOUNTER — Ambulatory Visit (HOSPITAL_COMMUNITY)
Admission: RE | Admit: 2024-01-31 | Discharge: 2024-01-31 | Disposition: A | Discharge status: Home / Self Care | Source: Ambulatory Visit | Attending: General Surgery | Admitting: General Surgery

## 2024-01-31 ENCOUNTER — Ambulatory Visit (HOSPITAL_BASED_OUTPATIENT_CLINIC_OR_DEPARTMENT_OTHER): Admitting: Anesthesiology

## 2024-01-31 ENCOUNTER — Encounter (HOSPITAL_BASED_OUTPATIENT_CLINIC_OR_DEPARTMENT_OTHER): Payer: Self-pay | Admitting: General Surgery

## 2024-01-31 ENCOUNTER — Encounter (HOSPITAL_BASED_OUTPATIENT_CLINIC_OR_DEPARTMENT_OTHER)
Admission: RE | Disposition: A | Discharge status: Home / Self Care | Payer: Self-pay | Source: Home / Self Care | Attending: General Surgery

## 2024-01-31 ENCOUNTER — Ambulatory Visit (HOSPITAL_BASED_OUTPATIENT_CLINIC_OR_DEPARTMENT_OTHER)
Admission: RE | Admit: 2024-01-31 | Discharge: 2024-01-31 | Disposition: A | Discharge status: Home / Self Care | Attending: General Surgery | Admitting: General Surgery

## 2024-01-31 ENCOUNTER — Ambulatory Visit

## 2024-01-31 DIAGNOSIS — C50212 Malignant neoplasm of upper-inner quadrant of left female breast: Secondary | ICD-10-CM | POA: Diagnosis present

## 2024-01-31 DIAGNOSIS — Z6841 Body Mass Index (BMI) 40.0 and over, adult: Secondary | ICD-10-CM | POA: Diagnosis not present

## 2024-01-31 DIAGNOSIS — F32A Depression, unspecified: Secondary | ICD-10-CM | POA: Insufficient documentation

## 2024-01-31 DIAGNOSIS — Z01818 Encounter for other preprocedural examination: Secondary | ICD-10-CM

## 2024-01-31 DIAGNOSIS — N651 Disproportion of reconstructed breast: Secondary | ICD-10-CM

## 2024-01-31 DIAGNOSIS — E66813 Obesity, class 3: Secondary | ICD-10-CM | POA: Insufficient documentation

## 2024-01-31 DIAGNOSIS — Z9884 Bariatric surgery status: Secondary | ICD-10-CM | POA: Diagnosis not present

## 2024-01-31 DIAGNOSIS — D649 Anemia, unspecified: Secondary | ICD-10-CM | POA: Insufficient documentation

## 2024-01-31 DIAGNOSIS — C50912 Malignant neoplasm of unspecified site of left female breast: Secondary | ICD-10-CM

## 2024-01-31 DIAGNOSIS — Z1732 Human epidermal growth factor receptor 2 negative status: Secondary | ICD-10-CM | POA: Insufficient documentation

## 2024-01-31 DIAGNOSIS — N6489 Other specified disorders of breast: Secondary | ICD-10-CM | POA: Insufficient documentation

## 2024-01-31 DIAGNOSIS — N6021 Fibroadenosis of right breast: Secondary | ICD-10-CM | POA: Diagnosis not present

## 2024-01-31 DIAGNOSIS — Z1721 Progesterone receptor positive status: Secondary | ICD-10-CM | POA: Insufficient documentation

## 2024-01-31 DIAGNOSIS — M549 Dorsalgia, unspecified: Secondary | ICD-10-CM | POA: Insufficient documentation

## 2024-01-31 DIAGNOSIS — M542 Cervicalgia: Secondary | ICD-10-CM | POA: Diagnosis not present

## 2024-01-31 DIAGNOSIS — Z170 Estrogen receptor positive status [ER+]: Secondary | ICD-10-CM | POA: Diagnosis not present

## 2024-01-31 DIAGNOSIS — Z9049 Acquired absence of other specified parts of digestive tract: Secondary | ICD-10-CM | POA: Insufficient documentation

## 2024-01-31 DIAGNOSIS — I10 Essential (primary) hypertension: Secondary | ICD-10-CM | POA: Insufficient documentation

## 2024-01-31 HISTORY — PX: BREAST REDUCTION SURGERY: SHX8

## 2024-01-31 HISTORY — PX: BREAST LUMPECTOMY WITH RADIOACTIVE SEED AND SENTINEL LYMPH NODE BIOPSY: SHX6550

## 2024-01-31 LAB — POCT PREGNANCY, URINE: Preg Test, Ur: NEGATIVE

## 2024-01-31 SURGERY — BREAST LUMPECTOMY WITH RADIOACTIVE SEED AND SENTINEL LYMPH NODE BIOPSY
Anesthesia: General | Site: Breast | Laterality: Left

## 2024-01-31 MED ORDER — SUGAMMADEX SODIUM 200 MG/2ML IV SOLN
INTRAVENOUS | Status: DC | PRN
Start: 2024-01-31 — End: 2024-01-31
  Administered 2024-01-31: 200 mg via INTRAVENOUS

## 2024-01-31 MED ORDER — SODIUM CHLORIDE 0.9% FLUSH
3.0000 mL | INTRAVENOUS | Status: DC | PRN
Start: 2024-01-31 — End: 2024-01-31

## 2024-01-31 MED ORDER — FENTANYL CITRATE (PF) 100 MCG/2ML IJ SOLN
INTRAMUSCULAR | Status: AC
  Filled 2024-01-31: qty 2

## 2024-01-31 MED ORDER — LACTATED RINGERS IV SOLN: INTRAVENOUS | Status: DC

## 2024-01-31 MED ORDER — FENTANYL CITRATE (PF) 100 MCG/2ML IJ SOLN
INTRAMUSCULAR | Status: DC | PRN
  Administered 2024-01-31 (×3): 50 ug via INTRAVENOUS

## 2024-01-31 MED ORDER — EPINEPHRINE PF 1 MG/ML IJ SOLN
INTRAMUSCULAR | Status: AC
Start: 2024-01-31 — End: 2024-01-31
  Filled 2024-01-31: qty 1

## 2024-01-31 MED ORDER — TECHNETIUM TC 99M TILMANOCEPT KIT
1.0000 | PACK | Freq: Once | INTRAVENOUS | Status: AC | PRN
  Administered 2024-01-31: 1 via INTRADERMAL

## 2024-01-31 MED ORDER — GABAPENTIN 100 MG PO CAPS
ORAL_CAPSULE | ORAL | Status: AC
Start: 2024-01-31 — End: 2024-01-31
  Filled 2024-01-31: qty 1

## 2024-01-31 MED ORDER — LIDOCAINE HCL (PF) 1 % IJ SOLN
INTRAMUSCULAR | Status: AC
  Filled 2024-01-31: qty 60

## 2024-01-31 MED ORDER — BUPIVACAINE LIPOSOME 1.3 % IJ SUSP
INTRAMUSCULAR | Status: DC | PRN
  Administered 2024-01-31: 20 mL

## 2024-01-31 MED ORDER — OXYCODONE HCL 5 MG/5ML PO SOLN: 5.0000 mg | Freq: Once | ORAL | Status: AC | PRN

## 2024-01-31 MED ORDER — CEFAZOLIN SODIUM-DEXTROSE 2-4 GM/100ML-% IV SOLN: 2.0000 g | INTRAVENOUS | Status: DC

## 2024-01-31 MED ORDER — SODIUM CHLORIDE 0.9% FLUSH: 3.0000 mL | Freq: Two times a day (BID) | INTRAVENOUS | Status: DC

## 2024-01-31 MED ORDER — ACETAMINOPHEN 325 MG PO TABS: 650.0000 mg | ORAL_TABLET | ORAL | Status: DC | PRN

## 2024-01-31 MED ORDER — ACETAMINOPHEN 325 MG RE SUPP: 650.0000 mg | RECTAL | Status: DC | PRN

## 2024-01-31 MED ORDER — ONDANSETRON HCL 4 MG/2ML IJ SOLN
INTRAMUSCULAR | Status: DC | PRN
Start: 2024-01-31 — End: 2024-01-31
  Administered 2024-01-31: 4 mg via INTRAVENOUS

## 2024-01-31 MED ORDER — DEXAMETHASONE SODIUM PHOSPHATE 10 MG/ML IJ SOLN
INTRAMUSCULAR | Status: AC
  Filled 2024-01-31: qty 1

## 2024-01-31 MED ORDER — ROCURONIUM BROMIDE 10 MG/ML (PF) SYRINGE
PREFILLED_SYRINGE | INTRAVENOUS | Status: AC
  Filled 2024-01-31: qty 10

## 2024-01-31 MED ORDER — ONDANSETRON HCL 4 MG/2ML IJ SOLN: 4.0000 mg | Freq: Once | INTRAMUSCULAR | Status: DC | PRN

## 2024-01-31 MED ORDER — FENTANYL CITRATE (PF) 100 MCG/2ML IJ SOLN
50.0000 ug | Freq: Once | INTRAMUSCULAR | Status: AC
  Administered 2024-01-31: 50 ug via INTRAVENOUS

## 2024-01-31 MED ORDER — CEFAZOLIN SODIUM-DEXTROSE 3-4 GM/150ML-% IV SOLN
INTRAVENOUS | Status: AC
  Filled 2024-01-31: qty 150

## 2024-01-31 MED ORDER — ONDANSETRON HCL 4 MG/2ML IJ SOLN
INTRAMUSCULAR | Status: AC
  Filled 2024-01-31: qty 2

## 2024-01-31 MED ORDER — FENTANYL CITRATE (PF) 100 MCG/2ML IJ SOLN: 25.0000 ug | INTRAMUSCULAR | Status: DC | PRN

## 2024-01-31 MED ORDER — PROPOFOL 10 MG/ML IV BOLUS
INTRAVENOUS | Status: DC | PRN
  Administered 2024-01-31: 150 mg via INTRAVENOUS

## 2024-01-31 MED ORDER — OXYCODONE HCL 5 MG PO TABS
ORAL_TABLET | ORAL | Status: AC
  Filled 2024-01-31: qty 1

## 2024-01-31 MED ORDER — MIDAZOLAM HCL 2 MG/2ML IJ SOLN
INTRAMUSCULAR | Status: AC
  Filled 2024-01-31: qty 2

## 2024-01-31 MED ORDER — OXYCODONE HCL 5 MG PO TABS: 5.0000 mg | ORAL_TABLET | ORAL | Status: DC | PRN

## 2024-01-31 MED ORDER — BUPIVACAINE LIPOSOME 1.3 % IJ SUSP
INTRAMUSCULAR | Status: AC
  Filled 2024-01-31: qty 20

## 2024-01-31 MED ORDER — MIDAZOLAM HCL 2 MG/2ML IJ SOLN
2.0000 mg | Freq: Once | INTRAMUSCULAR | Status: AC
  Administered 2024-01-31: 2 mg via INTRAVENOUS

## 2024-01-31 MED ORDER — LIDOCAINE-EPINEPHRINE 1 %-1:100000 IJ SOLN
INTRAMUSCULAR | Status: AC
  Filled 2024-01-31: qty 1

## 2024-01-31 MED ORDER — DEXAMETHASONE SODIUM PHOSPHATE 10 MG/ML IJ SOLN
INTRAMUSCULAR | Status: DC | PRN
Start: 2024-01-31 — End: 2024-01-31
  Administered 2024-01-31: 10 mg via INTRAVENOUS
  Administered 2024-01-31: 5 mg via INTRAVENOUS

## 2024-01-31 MED ORDER — ACETAMINOPHEN 500 MG PO TABS
ORAL_TABLET | ORAL | Status: AC
  Filled 2024-01-31: qty 2

## 2024-01-31 MED ORDER — OXYCODONE HCL 5 MG PO TABS: 5.0000 mg | ORAL_TABLET | Freq: Four times a day (QID) | ORAL | 0 refills | Status: DC | PRN

## 2024-01-31 MED ORDER — VASHE WOUND IRRIGATION OPTIME
TOPICAL | Status: DC | PRN
  Administered 2024-01-31: 34 [oz_av]

## 2024-01-31 MED ORDER — SODIUM CHLORIDE 0.9 % IV SOLN
250.0000 mL | INTRAVENOUS | Status: DC | PRN
Start: 2024-01-31 — End: 2024-01-31

## 2024-01-31 MED ORDER — BUPIVACAINE HCL 0.25 % IJ SOLN
INTRAMUSCULAR | Status: DC | PRN
  Administered 2024-01-31: 44 mL

## 2024-01-31 MED ORDER — NITROGLYCERIN 2 % TD OINT
TOPICAL_OINTMENT | TRANSDERMAL | Status: AC
  Filled 2024-01-31: qty 30

## 2024-01-31 MED ORDER — ACETAMINOPHEN 500 MG PO TABS
1000.0000 mg | ORAL_TABLET | ORAL | Status: AC
  Administered 2024-01-31: 1000 mg via ORAL

## 2024-01-31 MED ORDER — MIDAZOLAM HCL 5 MG/5ML IJ SOLN
INTRAMUSCULAR | Status: DC | PRN
  Administered 2024-01-31: 2 mg via INTRAVENOUS

## 2024-01-31 MED ORDER — OXYCODONE HCL 5 MG PO TABS
5.0000 mg | ORAL_TABLET | Freq: Once | ORAL | Status: AC | PRN
  Administered 2024-01-31: 5 mg via ORAL

## 2024-01-31 MED ORDER — BUPIVACAINE HCL (PF) 0.25 % IJ SOLN
INTRAMUSCULAR | Status: AC
  Filled 2024-01-31: qty 150

## 2024-01-31 MED ORDER — BUPIVACAINE-EPINEPHRINE (PF) 0.5% -1:200000 IJ SOLN
INTRAMUSCULAR | Status: DC | PRN
  Administered 2024-01-31: 30 mL

## 2024-01-31 MED ORDER — SUGAMMADEX SODIUM 200 MG/2ML IV SOLN
INTRAVENOUS | Status: AC
  Filled 2024-01-31: qty 2

## 2024-01-31 MED ORDER — ROCURONIUM BROMIDE 100 MG/10ML IV SOLN
INTRAVENOUS | Status: DC | PRN
  Administered 2024-01-31: 60 mg via INTRAVENOUS

## 2024-01-31 MED ORDER — GABAPENTIN 100 MG PO CAPS
100.0000 mg | ORAL_CAPSULE | ORAL | Status: AC
  Administered 2024-01-31: 100 mg via ORAL

## 2024-01-31 MED ORDER — ACETAMINOPHEN 10 MG/ML IV SOLN: 1000.0000 mg | Freq: Once | INTRAVENOUS | Status: DC | PRN

## 2024-01-31 MED ORDER — LIDOCAINE 2% (20 MG/ML) 5 ML SYRINGE
INTRAMUSCULAR | Status: AC
Start: 2024-01-31 — End: 2024-01-31
  Filled 2024-01-31: qty 5

## 2024-01-31 MED ORDER — CLONIDINE HCL (ANALGESIA) 100 MCG/ML EP SOLN
EPIDURAL | Status: DC | PRN
  Administered 2024-01-31: 75 ug

## 2024-01-31 MED ORDER — PROPOFOL 10 MG/ML IV BOLUS
INTRAVENOUS | Status: AC
  Filled 2024-01-31: qty 20

## 2024-01-31 MED ORDER — NITROGLYCERIN 2 % TD OINT
TOPICAL_OINTMENT | TRANSDERMAL | Status: DC | PRN
  Administered 2024-01-31: 1 [in_us] via TOPICAL

## 2024-01-31 MED ORDER — CEFAZOLIN SODIUM-DEXTROSE 3-4 GM/150ML-% IV SOLN
3.0000 g | INTRAVENOUS | Status: AC
  Administered 2024-01-31: 3 g via INTRAVENOUS

## 2024-01-31 SURGICAL SUPPLY — 76 items
BINDER BREAST LRG (GAUZE/BANDAGES/DRESSINGS) IMPLANT
BINDER BREAST MEDIUM (GAUZE/BANDAGES/DRESSINGS) IMPLANT
BINDER BREAST XLRG (GAUZE/BANDAGES/DRESSINGS) IMPLANT
BINDER BREAST XXLRG (GAUZE/BANDAGES/DRESSINGS) IMPLANT
BIOPATCH RED 1 DISK 7.0 (GAUZE/BANDAGES/DRESSINGS) IMPLANT
BLADE HEX COATED 2.75 (ELECTRODE) IMPLANT
BLADE KNIFE PERSONA 10 (BLADE) ×4 IMPLANT
BLADE SURG 15 STRL LF DISP TIS (BLADE) ×2 IMPLANT
CANISTER SUC SOCK COL 7IN (MISCELLANEOUS) IMPLANT
CANISTER SUCT 1200ML W/VALVE (MISCELLANEOUS) ×2 IMPLANT
CHLORAPREP W/TINT 26 (MISCELLANEOUS) ×2 IMPLANT
CLEANSER WND VASHE 34 (WOUND CARE) ×2 IMPLANT
CLIP APPLIE 9.375 MED OPEN (MISCELLANEOUS) ×2 IMPLANT
COTTONBALL LRG STERILE PKG (GAUZE/BANDAGES/DRESSINGS) IMPLANT
COVER BACK TABLE 60X90IN (DRAPES) ×2 IMPLANT
COVER MAYO STAND STRL (DRAPES) ×2 IMPLANT
COVER PROBE CYLINDRICAL 5X96 (MISCELLANEOUS) ×2 IMPLANT
DERMABOND ADVANCED .7 DNX12 (GAUZE/BANDAGES/DRESSINGS) ×4 IMPLANT
DRAIN CHANNEL 15F RND FF W/TCR (WOUND CARE) IMPLANT
DRAIN CHANNEL 19F RND (DRAIN) IMPLANT
DRAPE LAPAROSCOPIC ABDOMINAL (DRAPES) ×2 IMPLANT
DRAPE UTILITY XL STRL (DRAPES) ×2 IMPLANT
DRSG MEPILEX POST OP 4X8 (GAUZE/BANDAGES/DRESSINGS) ×4 IMPLANT
DRSG TEGADERM 4X4.75 (GAUZE/BANDAGES/DRESSINGS) IMPLANT
DRSG TELFA 3X8 NADH STRL (GAUZE/BANDAGES/DRESSINGS) IMPLANT
ELECT COATED BLADE 2.86 ST (ELECTRODE) ×2 IMPLANT
ELECTRODE BLDE 4.0 EZ CLN MEGD (MISCELLANEOUS) ×2 IMPLANT
ELECTRODE REM PT RTRN 9FT ADLT (ELECTROSURGICAL) ×2 IMPLANT
EVACUATOR SILICONE 100CC (DRAIN) IMPLANT
GAUZE PAD ABD 8X10 STRL (GAUZE/BANDAGES/DRESSINGS) ×4 IMPLANT
GAUZE XEROFORM 5X9 LF (GAUZE/BANDAGES/DRESSINGS) IMPLANT
GLOVE BIO SURGEON STRL SZ 6.5 (GLOVE) ×4 IMPLANT
GLOVE BIO SURGEON STRL SZ7.5 (GLOVE) ×2 IMPLANT
GLOVE BIOGEL PI IND STRL 7.0 (GLOVE) IMPLANT
GLOVE BIOGEL PI IND STRL 8 (GLOVE) IMPLANT
GOWN STRL REUS W/ TWL LRG LVL3 (GOWN DISPOSABLE) ×4 IMPLANT
GOWN STRL REUS W/ TWL XL LVL3 (GOWN DISPOSABLE) IMPLANT
KIT MARKER MARGIN INK (KITS) ×2 IMPLANT
LINER CANISTER 1000CC FLEX (MISCELLANEOUS) ×2 IMPLANT
NDL FILTER BLUNT 18X1 1/2 (NEEDLE) IMPLANT
NDL HYPO 25X1 1.5 SAFETY (NEEDLE) ×4 IMPLANT
NDL SAFETY ECLIPSE 18X1.5 (NEEDLE) IMPLANT
NEEDLE FILTER BLUNT 18X1 1/2 (NEEDLE) IMPLANT
NEEDLE HYPO 25X1 1.5 SAFETY (NEEDLE) ×4 IMPLANT
NS IRRIG 1000ML POUR BTL (IV SOLUTION) IMPLANT
PACK BASIN DAY SURGERY FS (CUSTOM PROCEDURE TRAY) ×2 IMPLANT
PAD ALCOHOL SWAB (MISCELLANEOUS) IMPLANT
PAD FOAM SILICONE BACKED (GAUZE/BANDAGES/DRESSINGS) IMPLANT
PENCIL SMOKE EVACUATOR (MISCELLANEOUS) ×2 IMPLANT
PIN SAFETY STERILE (MISCELLANEOUS) IMPLANT
POWDER MYRIAD MORCLLS FINE 500 (Miscellaneous) IMPLANT
SLEEVE SCD COMPRESS KNEE MED (STOCKING) ×2 IMPLANT
SPIKE FLUID TRANSFER (MISCELLANEOUS) IMPLANT
SPONGE T-LAP 18X18 ~~LOC~~+RFID (SPONGE) ×4 IMPLANT
STRIP SUTURE WOUND CLOSURE 1/2 (MISCELLANEOUS) ×8 IMPLANT
SUT MNCRL AB 4-0 PS2 18 (SUTURE) ×8 IMPLANT
SUT MON AB 3-0 SH27 (SUTURE) ×8 IMPLANT
SUT MON AB 4-0 PC3 18 (SUTURE) ×4 IMPLANT
SUT MON AB 5-0 PS2 18 (SUTURE) IMPLANT
SUT PDS II 3-0 CT2 27 ABS (SUTURE) ×8 IMPLANT
SUT SILK 2 0 SH (SUTURE) IMPLANT
SUT SILK 3 0 PS 1 (SUTURE) IMPLANT
SUT VIC AB 4-0 PS2 27 (SUTURE) IMPLANT
SUT VICRYL 3-0 CR8 SH (SUTURE) ×2 IMPLANT
SYR 50ML LL SCALE MARK (SYRINGE) IMPLANT
SYR BULB IRRIG 60ML STRL (SYRINGE) ×2 IMPLANT
SYR CONTROL 10ML LL (SYRINGE) ×4 IMPLANT
TAPE MEASURE VINYL STERILE (MISCELLANEOUS) IMPLANT
TOWEL GREEN STERILE FF (TOWEL DISPOSABLE) ×6 IMPLANT
TRAY DSU PREP LF (CUSTOM PROCEDURE TRAY) ×2 IMPLANT
TRAY FAXITRON CT DISP (TRAY / TRAY PROCEDURE) ×2 IMPLANT
TUBE CONNECTING 20X1/4 (TUBING) ×2 IMPLANT
TUBING INFILTRATION IT-10001 (TUBING) IMPLANT
TUBING SET GRADUATE ASPIR 12FT (MISCELLANEOUS) IMPLANT
UNDERPAD 30X36 HEAVY ABSORB (UNDERPADS AND DIAPERS) ×4 IMPLANT
YANKAUER SUCT BULB TIP NO VENT (SUCTIONS) ×2 IMPLANT

## 2024-01-31 NOTE — Progress Notes (Signed)
 Emotional support during breast injections

## 2024-01-31 NOTE — Anesthesia Postprocedure Evaluation (Signed)
 Anesthesia Post Note  Patient: JENALYN GIRDNER  Procedure(s) Performed: BREAST LUMPECTOMY WITH RADIOACTIVE SEED AND SENTINEL LYMPH NODE BIOPSY (Left: Breast) BREAST REDUCTION (Bilateral: Breast)     Patient location during evaluation: PACU Anesthesia Type: General Level of consciousness: awake and alert Pain management: pain level controlled Vital Signs Assessment: post-procedure vital signs reviewed and stable Respiratory status: spontaneous breathing, nonlabored ventilation, respiratory function stable and patient connected to nasal cannula oxygen Cardiovascular status: blood pressure returned to baseline and stable Postop Assessment: no apparent nausea or vomiting Anesthetic complications: no   No notable events documented.  Last Vitals:  Vitals:   01/31/24 1100 01/31/24 1110  BP: 125/71 139/77  Pulse: 69 71  Resp: 13 16  Temp:  36.6 C  SpO2: 96% 94%    Last Pain:  Vitals:   01/31/24 1117  TempSrc:   PainSc: 2                  Lynwood MARLA Cornea

## 2024-01-31 NOTE — H&P (Signed)
 REFERRING PHYSICIAN: Lanny Callander, MD PROVIDER: DEWARD GARNETTE NULL, MD MRN: [id number] DOB: 1975/09/24 Subjective    Chief Complaint: New Consultation (Breast Cancer)  History of Present Illness: Rebecca Dominguez is a 48 y.o. female who is seen today as an office consultation for evaluation of New Consultation (Breast Cancer)  We are asked to see the patient in consultation by Dr. Lanny to evaluate her for a new left breast cancer. The patient is a 48 year old white female who recently went for a routine screening mammogram. At that time she was found to have a 4 mm mass in the inner left breast. The axilla looked normal. The mass was biopsied and came back as a grade 1 invasive ductal cancer that was ER and PR positive and HER2 negative with a Ki-67 of 1%. She does have a mother with breast cancer. She is otherwise in pretty good health and does not smoke.  Review of Systems: A complete review of systems was obtained from the patient. I have reviewed this information and discussed as appropriate with the patient. See HPI as well for other ROS.  ROS   Medical History: Past Medical History:  Diagnosis Date  Anemia  Anxiety   Patient Active Problem List  Diagnosis  Malignant neoplasm of upper-inner quadrant of left breast in female, estrogen receptor positive (CMS/HHS-HCC)   Past Surgical History:  Procedure Laterality Date  CHOLECYSTECTOMY  LAPAROSCOPIC GASTRIC BYPASS  PILONIDAL CYST / SINUS EXCISION    No Known Allergies  Current Outpatient Medications on File Prior to Visit  Medication Sig Dispense Refill  ascorbic acid, vitamin C, (VITAMIN C) 500 MG tablet Take 500 mg by mouth once daily  aspirin-acetaminophen -caffeine (EXCEDRIN MIGRAINE) 250-250-65 mg per tablet Take 1 tablet by mouth every 6 (six) hours as needed  cyanocobalamin  (VITAMIN B12) 500 MCG tablet Take 500 mcg by mouth once daily  escitalopram  oxalate (LEXAPRO ) 10 MG tablet Take 1 tablet by mouth once daily   escitalopram  oxalate (LEXAPRO ) 20 MG tablet Take 20 mg by mouth once daily  ferrous sulfate 325 (65 FE) MG tablet Take 325 mg by mouth once daily  glucosamine-chondroit-vit C-Mn (GLUCOSAMINE-CHONDROITIN COMPLX) 500-400 mg Cap Take 1 capsule by mouth once daily  levonorgestreL (MIRENA 52 MG) IUD Insert 1 each into the uterus once  multivitamin (MULTIPLE VITAMIN ORAL) Take by mouth  naproxen (NAPROSYN) 500 MG tablet Take 500 mg by mouth 2 (two) times daily as needed   No current facility-administered medications on file prior to visit.   Family History  Problem Relation Age of Onset  Obesity Mother  High blood pressure (Hypertension) Mother  Deep vein thrombosis (DVT or abnormal blood clot formation) Mother  Breast cancer Mother  Obesity Father  Diabetes Father  Colon cancer Father  Diabetes Sister    Social History   Tobacco Use  Smoking Status Never  Smokeless Tobacco Never    Social History   Socioeconomic History  Marital status: Married  Tobacco Use  Smoking status: Never  Smokeless tobacco: Never  Vaping Use  Vaping status: Never Used  Substance and Sexual Activity  Alcohol use: Yes   Objective:   Vitals:  PainSc: 0-No pain   There is no height or weight on file to calculate BMI.  Physical Exam Vitals reviewed.  Constitutional:  General: She is not in acute distress. Appearance: Normal appearance.  HENT:  Head: Normocephalic and atraumatic.  Right Ear: External ear normal.  Left Ear: External ear normal.  Nose: Nose normal.  Mouth/Throat:  Mouth:  Mucous membranes are moist.  Pharynx: Oropharynx is clear.  Eyes:  General: No scleral icterus. Extraocular Movements: Extraocular movements intact.  Conjunctiva/sclera: Conjunctivae normal.  Pupils: Pupils are equal, round, and reactive to light.  Cardiovascular:  Rate and Rhythm: Normal rate and regular rhythm.  Pulses: Normal pulses.  Heart sounds: Normal heart sounds.  Pulmonary:  Effort:  Pulmonary effort is normal. No respiratory distress.  Breath sounds: Normal breath sounds.  Abdominal:  General: Bowel sounds are normal.  Palpations: Abdomen is soft.  Tenderness: There is no abdominal tenderness.  Musculoskeletal:  General: No swelling, tenderness or deformity. Normal range of motion.  Cervical back: Normal range of motion and neck supple.  Skin: General: Skin is warm and dry.  Coloration: Skin is not jaundiced.  Neurological:  General: No focal deficit present.  Mental Status: She is alert and oriented to person, place, and time.  Psychiatric:  Mood and Affect: Mood normal.  Behavior: Behavior normal.     Breast: There is no palpable mass in either breast. There is no palpable axillary, supraclavicular, or cervical lymphadenopathy.  Labs, Imaging and Diagnostic Testing:  Assessment and Plan:   Diagnoses and all orders for this visit:  Malignant neoplasm of upper-inner quadrant of left breast in female, estrogen receptor positive (CMS/HHS-HCC)   The patient appears to have a 4 mm cancer in the inner aspect of the left breast with clinically negative nodes and all favorable markers. I have discussed with her in detail the different options for treatment and at this point she favors breast conservation which I feel is very reasonable. Given her young age she would be a good candidate for sentinel node mapping as well. I have discussed with her in detail the risks and benefits of the operation as well as some of the technical aspects including the use of a radioactive seed for localization and she understands and wishes to proceed. We will move forward with surgical scheduling. She will meet with medical and radiation oncology as well to discuss adjuvant therapy. Plan for reduction lumpectomy with plastics and sentinel node bx.

## 2024-01-31 NOTE — Progress Notes (Signed)
 Assisted Dr. Ace Gins with left, pectoralis, ultrasound guided block. Side rails up, monitors on throughout procedure. See vital signs in flow sheet. Tolerated Procedure well.

## 2024-01-31 NOTE — Anesthesia Procedure Notes (Signed)
 Procedure Name: Intubation Date/Time: 01/31/2024 8:32 AM  Performed by: Frost Kayla MATSU, CRNAPre-anesthesia Checklist: Patient identified, Emergency Drugs available, Suction available and Patient being monitored Patient Re-evaluated:Patient Re-evaluated prior to induction Oxygen Delivery Method: Circle system utilized Preoxygenation: Pre-oxygenation with 100% oxygen Induction Type: IV induction Ventilation: Mask ventilation without difficulty Laryngoscope Size: Miller and 3 Grade View: Grade I Tube type: Oral Tube size: 7.0 mm Number of attempts: 1 Airway Equipment and Method: Stylet and Oral airway Placement Confirmation: ETT inserted through vocal cords under direct vision, positive ETCO2 and breath sounds checked- equal and bilateral Secured at: 20 cm Tube secured with: Tape Dental Injury: Teeth and Oropharynx as per pre-operative assessment

## 2024-01-31 NOTE — Op Note (Signed)
 01/31/2024  9:45 AM  PATIENT:  Rebecca Dominguez  48 y.o. female  PRE-OPERATIVE DIAGNOSIS:  LEFT BREAST CANCER  POST-OPERATIVE DIAGNOSIS:  LEFT BREAST CANCER  PROCEDURE:  Procedure(s) with comments: LEFT BREAST RADIOACTIVE SEED LOCALIZED LUMPECTOMY AND DEEP LEFT AXILLARY SENTINEL LYMPH NODE BIOPSY   SURGEON:  Surgeons and Role: Panel 1:    DEWAINE Curvin Deward DOUGLAS, MD - Primary  PHYSICIAN ASSISTANT:   ASSISTANTS: none   ANESTHESIA:   local and general  EBL:  minimal   BLOOD ADMINISTERED:none  DRAINS: none   LOCAL MEDICATIONS USED:  MARCAINE      SPECIMEN:  Source of Specimen:  left breast tissue with additional deep, superior, and inferior margins and sentinel node  DISPOSITION OF SPECIMEN:  PATHOLOGY  COUNTS:  YES  TOURNIQUET:  * No tourniquets in log *  DICTATION: .Dragon Dictation  After informed consent was obtained the patient was brought to the operating room and placed in the supine position on the operating room table.  After adequate induction of general anesthesia the patient's bilateral chest, breast, and axillary areas were prepped with Betadine and draped in usual sterile manner.  An appropriate timeout was performed.  Previously an I-125 seed was placed in the inner aspect of the left breast to mark an area of invasive breast cancer.  Also earlier in the day the patient underwent injection 1 mCi of technetium sulfur colloid in the subareolar position on the left breast.  The neoprobe was initially set to technetium.  A good signal was identified in the left axilla.  The area overlying this was infiltrated with quarter percent Marcaine .  A small transversely oriented incision was made with a 15 blade knife overlying the radioactive signal.  The incision was carried through the skin and subcutaneous tissue sharply with the electrocautery until the deep left axillary space was entered.  Blunt dissection was carried out under the direction of the neoprobe.  I was able to  identify the lymph node with signal.  The lymph node was excised sharply with the electrocautery and the surrounding small vessels and lymphatics were controlled with clips.  Ex vivo counts on this node were approximately 6000.  No other hot or palpable nodes were identified in the left axilla.  I believe there were 2-3 nodes within the excised tissue.  This was sent as sentinel node #1.  Hemostasis was achieved using the Bovie electrocautery.  The deep layer of the axilla was closed with interrupted 3-0 Vicryl stitches.  The skin was closed with a running 4-0 Monocryl subcuticular stitch.  Attention was then turned to the left breast.  The neoprobe was set to I-125 in the area of radioactivity was readily identified.  Because the seed was so superficial to the skin I elected to make an elliptical incision in the skin overlying the area of radioactivity with a 15 blade knife.  The incision was carried through the skin and subcutaneous tissue sharply with the electrocautery.  Dissection was carried widely around the radioactive seed while checking the area of radioactivity frequently.  Once the tissue was removed it was oriented with the appropriate paint colors.  A specimen radiograph was obtained that showed the clip and seed to be within the specimen.  I did elect to take an additional deep, superior, and inferior margins and these were all marked appropriately.  The tissue was then all sent to pathology for further evaluation.  Hemostasis was achieved using the Bovie electrocautery.  The wound was irrigated with  saline and infiltrated with more quarter percent Marcaine .  The cavity was marked with clips.  The incision was then closed with layers of interrupted 3-0 Vicryl stitches.  The skin was closed with interrupted 4-0 Monocryl subcuticular stitches.  Dermabond dressings were applied.  The patient tolerated the procedure well.  At this point the operation was turned over to Dr. Lowery for the plastics  portion of the case.  Her portion will be dictated separately.  PLAN OF CARE: Discharge to home after PACU  PATIENT DISPOSITION:  PACU - hemodynamically stable.   Delay start of Pharmacological VTE agent (>24hrs) due to surgical blood loss or risk of bleeding: not applicable

## 2024-01-31 NOTE — Interval H&P Note (Signed)
 History and Physical Interval Note:  01/31/2024 7:23 AM  Rebecca Dominguez  has presented today for surgery, with the diagnosis of LEFT BREAST CANCER.  The various methods of treatment have been discussed with the patient and family. After consideration of risks, benefits and other options for treatment, the patient has consented to  Procedure(s) with comments: BREAST LUMPECTOMY WITH RADIOACTIVE SEED AND SENTINEL LYMPH NODE BIOPSY (Left) - GEN w/PEC BLOCK LEFT BREAST RADIOACTIVE SEED LOCALIZED LUMPECTOMY AND SENTINEL NODE BIOPSY BREAST REDUCTION WITH LIPOSUCTION (Bilateral) as a surgical intervention.  The patient's history has been reviewed, patient examined, no change in status, stable for surgery.  I have reviewed the patient's chart and labs.  Questions were answered to the patient's satisfaction.     [name]

## 2024-01-31 NOTE — Op Note (Signed)
 Op note:    DATE OF PROCEDURE: 01/31/2024  LOCATION: [facility]  SURGEON: Estefana Fritter, DO  ASSISTANT: [facility]  PREOPERATIVE DIAGNOSIS 1. Breast cancer - left 2. Breast asymmetry 3. Neck Pain, Back Pain  POSTOPERATIVE DIAGNOSIS Same as preoperative diagnosis  PROCEDURES 1. Right breast reduction. 200 gm 2. Left breast mastopexy  COMPLICATIONS: None.  DRAINS: none  INDICATIONS FOR PROCEDURE Rebecca Dominguez is a 48 y.o. year-old female born on [birth year],with left breast cancer.  She is going to have a left partial mastectomy and would like symmetry so the decision was made to do a right reduction and I left reduction or lift depending on what was needed. MRN: [id number]  CONSENT Informed consent was obtained directly from the patient. The risks, benefits and alternatives were fully discussed. Specific risks including but not limited to bleeding, infection, hematoma, seroma, scarring, pain, nipple necrosis, asymmetry, poor cosmetic results, and need for further surgery were discussed. The patient's questions were answered.  DESCRIPTION OF PROCEDURE  Patient was brought into the operating room and rested on the operating room table in the supine position.  SCDs were placed and appropriate padding was performed.  Antibiotics were given. The patient underwent general anesthesia and the chest was prepped and draped in a sterile fashion.  A timeout was performed and all information was confirmed to be correct by those in the room.  Right side: Preoperative markings were confirmed.  Incision lines were injected with local containing epinephrine .  After waiting for vasoconstriction, the marked lines were incised with a #15 blade.  A Wise-pattern superomedial breast reduction was performed by de-epithelializing the pedicle, using bovie to create the superomedial pedicle, and removing breast tissue from the superior, lateral, and inferior portions of  the breast.  Care was taken to not undermine the breast pedicle. Hemostasis was achieved.  The nipple was gently rotated into position and the soft tissue closed with 4-0 Monocryl.  Exparel  was placed in the pocket along with myriad. Hemostasis confirmed.  The deep tissues were approximated with 3-0 PDS sutures.  The skin was closed with deep dermal 3-0 Monocryl and subcuticular 4-0 Monocryl sutures.  The nipple and skin flaps had good capillary refill at the end of the procedure.    Left side: Preoperative markings were confirmed.  Incision lines were injected with local containing epinephrine .  After waiting for vasoconstriction, the marked lines were incised with a #15 blade.  A Wise-pattern superomedial skin incision was performed.  The skin was de-epithelializing around the areola and the inferior portion of what would be the pedicle.  No tissue was removed.  Undermining was done for 1-2 cm at the vertical limb.  The lift was performed but all tissue kept to the central pedicle. Hemostasis was achieved.  The nipple was gently lifted into position and the soft tissue was closed with 3-0 and then 4-0 Monocryl.  The patient was sat upright and size and shape symmetry was confirmed.  The deep tissues were approximated with 3-0 PDS sutures. The skin was closed with deep dermal 3-0 Monocryl and subcuticular 4-0 Monocryl sutures.  Dermabond was applied.  A breast binder and ABDs were placed.  The nipple and skin flaps had good capillary refill at the end of the procedure.  The patient tolerated the procedure well. The patient was allowed to wake from anesthesia and taken to the recovery room in satisfactory condition. Nitropaste was applied to each nipple for precautionary measures.  The advanced practice practitioner (APP)  assisted throughout the case.  The APP was essential in retraction and counter traction when needed to make the case progress smoothly.  This retraction and assistance made it possible to see  the tissue plans for the procedure.  The assistance was needed for blood control, tissue re-approximation and assisted with closure of the incision site.

## 2024-01-31 NOTE — Transfer of Care (Signed)
 Immediate Anesthesia Transfer of Care Note  Patient: Rebecca Dominguez  Procedure(s) Performed: BREAST LUMPECTOMY WITH RADIOACTIVE SEED AND SENTINEL LYMPH NODE BIOPSY (Left: Breast) BREAST REDUCTION (Bilateral: Breast)  Patient Location: PACU  Anesthesia Type:GA combined with regional for post-op pain  Level of Consciousness: drowsy, patient cooperative, and responds to stimulation  Airway & Oxygen Therapy: Patient Spontanous Breathing and Patient connected to face mask oxygen  Post-op Assessment: Report given to RN and Post -op Vital signs reviewed and stable  Post vital signs: Reviewed and stable  Last Vitals:  Vitals Value Taken Time  BP    Temp    Pulse    Resp    SpO2      Last Pain:  Vitals:   01/31/24 0639  TempSrc: Temporal  PainSc: 0-No pain      Patients Stated Pain Goal: 4 (01/31/24 0639)  Complications: No notable events documented.

## 2024-01-31 NOTE — Anesthesia Preprocedure Evaluation (Addendum)
 Anesthesia Evaluation  Patient identified by MRN, date of birth, ID band Patient awake    Reviewed: Allergy & Precautions, NPO status , Patient's Chart, lab work & pertinent test results, reviewed documented beta blocker date and time   History of Anesthesia Complications Negative for: history of anesthetic complications  Airway Mallampati: II  TM Distance: >3 FB     Dental no notable dental hx.    Pulmonary neg COPD, Not current smoker, neg PE   breath sounds clear to auscultation       Cardiovascular hypertension, (-) CAD, (-) Past MI and (-) Cardiac Stents  Rhythm:Regular Rate:Normal     Neuro/Psych  Headaches, neg Seizures PSYCHIATRIC DISORDERS  Depression       GI/Hepatic ,neg GERD  ,,(+) neg Cirrhosis        Endo/Other    Class 3 obesity  Renal/GU      Musculoskeletal   Abdominal   Peds  Hematology  (+) Blood dyscrasia, anemia   Anesthesia Other Findings   Reproductive/Obstetrics                              Anesthesia Physical Anesthesia Plan  ASA: 3  Anesthesia Plan: General   Post-op Pain Management: Regional block*   Induction:   PONV Risk Score and Plan:   Airway Management Planned: Oral ETT  Additional Equipment:   Intra-op Plan:   Post-operative Plan: Extubation in OR  Informed Consent: I have reviewed the patients History and Physical, chart, labs and discussed the procedure including the risks, benefits and alternatives for the proposed anesthesia with the patient or authorized representative who has indicated his/her understanding and acceptance.     Dental advisory given  Plan Discussed with: CRNA  Anesthesia Plan Comments:          Anesthesia Quick Evaluation

## 2024-01-31 NOTE — Discharge Instructions (Addendum)
 INSTRUCTIONS FOR AFTER BREAST SURGERY   You will likely have some questions about what to expect following your operation.  The following information will help you and your family understand what to expect when you are discharged from the hospital.  It is important to follow these guidelines to help ensure a smooth recovery and reduce complication.  Postoperative instructions include information on: diet, wound care, medications and physical activity.  AFTER SURGERY Expect to go home after the procedure.  In some cases, you may need to spend one night in the hospital for observation.  DIET Breast surgery does not require a specific diet.  However, the healthier you eat the better your body will heal. It is important to increasing your protein intake.  This means limiting the foods with sugar and carbohydrates.  Focus on vegetables and some meat.  If you have liposuction during your procedure be sure to drink water.  If your urine is bright yellow, then it is concentrated, and you need to drink more water.  As a general rule after surgery, you should have 8 ounces of water every hour while awake.  If you find you are persistently nauseated or unable to take in liquids let us  know.  NO TOBACCO USE or EXPOSURE.  This will slow your healing process and lead to a wound.  WOUND CARE Leave the binder on for 3 days . Use fragrance free soap like Dial, Dove or [place].   After 3 days you can remove the binder to shower. Once dry apply binder or sports bra. If you have liposuction you will have a soft and spongy dressing (Lipofoam) that helps prevent creases in your skin.  Remove before you shower and then replace it.  It is also available on [employer]. If you have steri-strips / tape directly attached to your skin leave them in place. It is OK to get these wet.   No baths, pools or hot tubs for four weeks. We close your incision to leave the smallest and best-looking scar. No ointment or creams on your incisions  for four weeks.  No Neosporin (Too many skin reactions).  A few weeks after surgery you can use Mederma and start massaging the scar. We ask you to wear your binder or sports bra for the first 6 weeks around the clock, including while sleeping. This provides added comfort and helps reduce the fluid accumulation at the surgery site. NO Ice or heating pads to the operative site.  You have a very high risk of a BURN before you feel the temperature change.  ACTIVITY No heavy lifting until cleared by the doctor.  This usually means no more than a half-gallon of milk.  It is OK to walk and climb stairs. Moving your legs is very important to decrease your risk of a blood clot.  It will also help keep you from getting deconditioned.  Every 1 to 2 hours get up and walk for 5 minutes. This will help with a quicker recovery back to normal.  Let pain be your guide so you don't do too much.  This time is for you to recover.  You will be more comfortable if you sleep and rest with your head elevated either with a few pillows under you or in a recliner.  No stomach sleeping for a three months.  WORK Everyone returns to work at different times. As a rough guide, most people take at least 1 - 2 weeks off prior to returning to work. If  you need documentation for your job, give the forms to the front staff at the clinic.  DRIVING Arrange for someone to bring you home from the hospital after your surgery.  You may be able to drive a few days after surgery but not while taking any narcotics or valium.  BOWEL MOVEMENTS Constipation can occur after anesthesia and while taking pain medication.  It is important to stay ahead for your comfort.  We recommend taking Milk of Magnesia (2 tablespoons; twice a day) while taking the pain pills.  MEDICATIONS You may be prescribed should start after surgery At your preoperative visit for you history and physical you may have been given the following medications: An antibiotic: Start  this medication when you get home and take according to the instructions on the bottle. Zofran  4 mg:  This is to treat nausea and vomiting.  You can take this every 6 hours as needed and only if needed. Valium 2 mg for breast cancer patients: This is for muscle tightness if you have an implant or expander. This will help relax your muscle which also helps with pain control.  This can be taken every 12 hours as needed. Don't drive after taking this medication. Norco (hydrocodone/acetaminophen ) 5/325 mg:  This is only to be used after you have taken the Motrin or the Tylenol . Every 8 hours as needed.   Over the counter Medication to take: Ibuprofen (Motrin) 600 mg:  Take this every 6 hours.  If you have additional pain then take 500 mg of the Tylenol  every 8 hours.  No Tylenol  until after 12:45pm today, if needed. Only take the Norco after you have tried these two. MiraLAX or Milk of Magnesia: Take this according to the bottle if you take the Norco.  WHEN TO CALL Call your surgeon's office if any of the following occur: Fever 101 degrees F or greater Excessive bleeding or fluid from the incision site. Pain that increases over time without aid from the medications Redness, warmth, or pus draining from incision sites Persistent nausea or inability to take in liquids Severe misshapen area that underwent the operation.  Post Anesthesia Home Care Instructions  Activity: Get plenty of rest for the remainder of the day. A responsible individual must stay with you for 24 hours following the procedure.  For the next 24 hours, DO NOT: -Drive a car -Advertising copywriter -Drink alcoholic beverages -Take any medication unless instructed by your physician -Make any legal decisions or sign important papers.  Meals: Start with liquid foods such as gelatin or soup. Progress to regular foods as tolerated. Avoid greasy, spicy, heavy foods. If nausea and/or vomiting occur, drink only clear liquids until the  nausea and/or vomiting subsides. Call your physician if vomiting continues.  Special Instructions/Symptoms: Your throat may feel dry or sore from the anesthesia or the breathing tube placed in your throat during surgery. If this causes discomfort, gargle with warm salt water. The discomfort should disappear within 24 hours.  If you had a scopolamine patch placed behind your ear for the management of post- operative nausea and/or vomiting:  1. The medication in the patch is effective for 72 hours, after which it should be removed.  Wrap patch in a tissue and discard in the trash. Wash hands thoroughly with soap and water. 2. You may remove the patch earlier than 72 hours if you experience unpleasant side effects which may include dry mouth, dizziness or visual disturbances. 3. Avoid touching the patch. Wash your hands with  soap and water after contact with the patch.   Information for Discharge Teaching: EXPAREL  (bupivacaine  liposome injectable suspension)   Pain relief is important to your recovery. The goal is to control your pain so you can move easier and return to your normal activities as soon as possible after your procedure. Your physician may use several types of medicines to manage pain, swelling, and more.  Your surgeon or anesthesiologist gave you EXPAREL (bupivacaine ) to help control your pain after surgery.  EXPAREL  is a local anesthetic designed to release slowly over an extended period of time to provide pain relief by numbing the tissue around the surgical site. EXPAREL  is designed to release pain medication over time and can control pain for up to 72 hours. Depending on how you respond to EXPAREL , you may require less pain medication during your recovery. EXPAREL  can help reduce or eliminate the need for opioids during the first few days after surgery when pain relief is needed the most. EXPAREL  is not an opioid and is not addictive. It does not cause sleepiness or sedation.    Important! A teal colored band has been placed on your arm with the date, time and amount of EXPAREL  you have received. Please leave this armband in place for the full 96 hours following administration, and then you may remove the band. If you return to the hospital for any reason within 96 hours following the administration of EXPAREL , the armband provides important information that your health care providers to know, and alerts them that you have received this anesthetic.    Possible side effects of EXPAREL : Temporary loss of sensation or ability to move in the area where medication was injected. Nausea, vomiting, constipation Rarely, numbness and tingling in your mouth or lips, lightheadedness, or anxiety may occur. Call your doctor right away if you think you may be experiencing any of these sensations, or if you have other questions regarding possible side effects.  Follow all other discharge instructions given to you by your surgeon or nurse. Eat a healthy diet and drink plenty of water or other fluids.

## 2024-01-31 NOTE — Anesthesia Procedure Notes (Signed)
 Anesthesia Regional Block: Pectoralis block   Pre-Anesthetic Checklist: , timeout performed,  Correct Patient, Correct Site, Correct Laterality,  Correct Procedure, Correct Position, site marked,  Risks and benefits discussed,  Surgical consent,  Pre-op evaluation,  At surgeon's request and post-op pain management  Laterality: Left  Prep: Maximum Sterile Barrier Precautions used, chloraprep       Needles:  Injection technique: Single-shot  Needle Type: Echogenic Needle     Needle Length: 5cm  Needle Gauge: 22     Additional Needles:   Procedures:,,,, ultrasound used (permanent image in chart),,    Narrative:  Start time: 01/31/2024 7:50 AM End time: 01/31/2024 7:55 AM Injection made incrementally with aspirations every 5 mL.  Performed by: Personally  Anesthesiologist: Keneth Lynwood POUR, MD

## 2024-02-01 ENCOUNTER — Encounter (HOSPITAL_BASED_OUTPATIENT_CLINIC_OR_DEPARTMENT_OTHER): Payer: Self-pay | Admitting: General Surgery

## 2024-02-01 LAB — SURGICAL PATHOLOGY

## 2024-02-03 ENCOUNTER — Ambulatory Visit: Payer: Self-pay | Admitting: General Surgery

## 2024-02-04 ENCOUNTER — Encounter: Payer: Self-pay | Admitting: *Deleted

## 2024-02-04 ENCOUNTER — Telehealth: Payer: Self-pay | Admitting: *Deleted

## 2024-02-04 ENCOUNTER — Telehealth: Payer: Self-pay | Admitting: Student

## 2024-02-04 ENCOUNTER — Telehealth: Payer: Self-pay

## 2024-02-04 NOTE — Telephone Encounter (Signed)
 Patient left message with questions regarding bandages and dressing changes. Rebecca Dominguez, Rebecca Dominguez called patient but she did not answer. Pt called back at 4:14pm. Pt had a lumpectomy and unilateral breast reduction on 7/14. She asked if mepilex dressing should be taken off before she got in the shower. I adv because she has not had her first post op appointment yet to leave them on when showering. I adv her to turn her back to the water and allow the water to run down her shoulders onto her breast, pat dry and apply binder/sports bra. Adv that she could use a maxi or abd pad to place on her sides and over her incisions under her breast to provide more comfort. Also adv she could use an abd pad or gauze to place over nipples if she was experiencing hypersensitivity. Patient conveyed understanding.

## 2024-02-04 NOTE — Telephone Encounter (Signed)
Ordered oncotype per Dr. Feng.  Sent requisition to pathology and exact sciences. 

## 2024-02-04 NOTE — Telephone Encounter (Signed)
 Patient called and LVM on clinical line with questions regarding postop care. I attempted to call her but she did not answer. LVM to return call

## 2024-02-08 ENCOUNTER — Encounter: Payer: Self-pay | Admitting: Plastic Surgery

## 2024-02-08 ENCOUNTER — Ambulatory Visit: Admitting: Plastic Surgery

## 2024-02-08 ENCOUNTER — Telehealth: Payer: Self-pay | Admitting: Student

## 2024-02-08 DIAGNOSIS — Z170 Estrogen receptor positive status [ER+]: Secondary | ICD-10-CM

## 2024-02-08 DIAGNOSIS — C50212 Malignant neoplasm of upper-inner quadrant of left female breast: Secondary | ICD-10-CM

## 2024-02-08 NOTE — Telephone Encounter (Signed)
 I attempted to call the patient to discuss going back to work, but she did not answer.  I left a voicemail for her to return my call.

## 2024-02-08 NOTE — Progress Notes (Signed)
 The patient is a 48 year old female here for follow-up after undergoing surgery on July 14.  She had a right breast reduction of around 200 g.  She had a left breast mastopexy.  This was at the time of cancer treatment for breast cancer.  She has some bruising and swelling as expected.  No sign of a hematoma or seroma. She has had very little pain. Continue with the binder or sports bra.   Pictures were obtained of the patient and placed in the chart with the patient's or guardian's permission.

## 2024-02-08 NOTE — Telephone Encounter (Signed)
 I called the patient in regards to her MyChart message about work.  Patient states that she has been doing really well after her surgery and has not had to take any pain medications for the past few days and has not even had to take Tylenol  or ibuprofen.  She is inquiring if she can go back to work.  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  I discussed with her that she is okay to go back to work as long as she feels comfortable and she is not taking her pain medications.  I recommended that she get up and ambulate frequently while at work to help prevent blood clots.  I discussed with her she will still have lifting restrictions.  She expressed understanding.

## 2024-02-15 ENCOUNTER — Telehealth: Payer: Self-pay | Admitting: Hematology

## 2024-02-15 NOTE — Telephone Encounter (Signed)
 Rescheduled appointment per provider on-call. Talked with the patient and she is aware of the changes made to her upcoming appointment.

## 2024-02-17 ENCOUNTER — Ambulatory Visit: Admitting: Physician Assistant

## 2024-02-17 ENCOUNTER — Telehealth: Payer: Self-pay | Admitting: Physician Assistant

## 2024-02-17 ENCOUNTER — Encounter (HOSPITAL_COMMUNITY): Payer: Self-pay

## 2024-02-17 ENCOUNTER — Encounter: Payer: Self-pay | Admitting: Physician Assistant

## 2024-02-17 VITALS — BP 124/75 | HR 76 | Ht 65.5 in | Wt 280.6 lb

## 2024-02-17 DIAGNOSIS — Z170 Estrogen receptor positive status [ER+]: Secondary | ICD-10-CM

## 2024-02-17 DIAGNOSIS — C50212 Malignant neoplasm of upper-inner quadrant of left female breast: Secondary | ICD-10-CM

## 2024-02-17 DIAGNOSIS — Z9889 Other specified postprocedural states: Secondary | ICD-10-CM

## 2024-02-17 NOTE — Progress Notes (Signed)
 Patient is a pleasant 48 year old female s/p left breast lumpectomy with mastopexy and right breast oncoplastic reduction for symmetry 01/31/2024 by Dr. Curvin with Dr. Lowery who presents to clinic for postoperative follow-up.  She was last seen here in clinic on 02/08/2024.  At that time, no evidence concerning for seroma or hematoma.  Today, patient is doing okay.  She does endorse some tenderness at the inferior aspect of right breast.  She also noticed what she describes as purulence from incisional wound at lower aspect of vertical incision.  Patient reports that it has been particularly moist due to the compressive garments in conjunction with the heat.  She denies any fevers, chest pain, difficulty breathing, or leg swelling.  She also is continuing to have issues getting her FMLA approved and has provided me with her assigned agent's contact information.  On exam, breasts have excellent shape and symmetry.  NAC's are healthy and viable, sensitive to light touch.  Several of her Steri-Strips have already fallen off spontaneously.  There is a 1 cm incisional wound at the inferior aspect of vertical limb right breast with some incisional slough.  There is some mild surrounding erythema as well as erythema along inframammary incision, but no induration or cellulitic changes.  No crepitus or palpable underlying fluid collections.  The asymptomatic left breast also has similar erythema along inframammary incision, but again without evidence concerning for infection.  Suspect the purulence patient observed is simply slough from the small incisional wound at inferior aspect vertical limb.  Patient understands that this will need to heal via secondary intent and cannot be primarily closed.  Do not feel as though antibiotics are warranted at this time.  She is healing appropriately.  Applied Vashe soaked gauze to the inframammary incision.  Recommending Vaseline daily.  Follow-up in 1 week for reevaluation.   Will call her FMLA agent in interim.  Picture(s) obtained of the patient and placed in the chart were with the patient's or guardian's permission.

## 2024-02-18 ENCOUNTER — Encounter: Payer: Self-pay | Admitting: *Deleted

## 2024-02-18 ENCOUNTER — Telehealth: Payer: Self-pay | Admitting: *Deleted

## 2024-02-18 DIAGNOSIS — C50212 Malignant neoplasm of upper-inner quadrant of left female breast: Secondary | ICD-10-CM

## 2024-02-18 NOTE — Telephone Encounter (Signed)
 Received oncotype results of 14/4%. Patient is aware. Referral placed for Dr. Shannon.

## 2024-02-21 NOTE — Therapy (Signed)
 OUTPATIENT PHYSICAL THERAPY BREAST CANCER POST OP FOLLOW UP   Patient Name: Rebecca Dominguez MRN: [id number] DOB:09-28-1975, 48 y.o., female Today's Date: 02/22/2024  END OF SESSION:  PT End of Session - 02/22/24 1652     Visit Number 2    Number of Visits 2    Date for PT Re-Evaluation 01/26/24    PT Start Time 1600    PT Stop Time 1638    PT Time Calculation (min) 38 min    Activity Tolerance Patient tolerated treatment well    Behavior During Therapy WFL for tasks assessed/performed          Past Medical History:  Diagnosis Date   Anemia    low iron   Back pain    Breast mass    Family history of brain cancer    Family history of breast cancer    Family history of prostate cancer    Foot pain    from previous fracture of toe 2010   Gallbladder problem    removed   Headache    occasional   Hypertension    no longer on medications since weight loss   Joint pain    Obesity    Plantar fasciitis    Vitamin B 12 deficiency    Vitamin D  deficiency    Past Surgical History:  Procedure Laterality Date   BREAST BIOPSY Left 11/25/2023   US  LT BREAST BX W LOC DEV 1ST LESION IMG BX SPEC US  GUIDE 11/25/2023 [facility]   BREAST BIOPSY  01/28/2024   MM LT RADIOACTIVE SEED LOC MAMMO GUIDE 01/28/2024 [facility]   BREAST LUMPECTOMY WITH RADIOACTIVE SEED AND SENTINEL LYMPH NODE BIOPSY Left 01/31/2024   Procedure: BREAST LUMPECTOMY WITH RADIOACTIVE SEED AND SENTINEL LYMPH NODE BIOPSY;  Surgeon: Curvin Deward MOULD, MD;  Location: [facility];  Service: General;  Laterality: Left;  GEN w/PEC BLOCK LEFT BREAST RADIOACTIVE SEED LOCALIZED LUMPECTOMY AND SENTINEL NODE BIOPSY   BREAST REDUCTION SURGERY Bilateral 01/31/2024   Procedure: BREAST REDUCTION;  Surgeon: Lowery Estefana RAMAN, DO;  Location: [facility];  Service: Plastics;  Laterality: Bilateral;   CHOLECYSTECTOMY  2013   COLONOSCOPY WITH PROPOFOL  N/A 03/06/2016   Procedure: COLONOSCOPY WITH  PROPOFOL ;  Surgeon: Elsie Cree, MD;  Location: [facility];  Service: Endoscopy;  Laterality: N/A;   gastric bypass surgery  2001   PILONIDAL CYST EXCISION     WISDOM TOOTH EXTRACTION     Patient Active Problem List   Diagnosis Date Noted   H/O gastric bypass 01/13/2024   Genetic testing 12/09/2023   Family history of breast cancer    Family history of brain cancer    Family history of prostate cancer    Malignant neoplasm of upper-inner quadrant of left female breast (HCC) 11/29/2023   Dysfunctional family processes 06/01/2023   Other constipation 03/31/2023   Bilateral leg edema 12/22/2022   Stress 12/22/2022   Low vitamin D  level 12/22/2022   Generalized edema 09/15/2022   BMI 40.0-44.9, adult (HCC) 09/15/2022   Obesity, Beginning BMI 56.25 09/15/2022   Low ferritin level 07/27/2022   Vitamin B 12 deficiency 07/27/2022   Vitamin D  deficiency 05/27/2022   Depression 04/29/2020   Insulin  resistance 10/12/2018   Class 3 severe obesity with serious comorbidity and body mass index (BMI) of 50.0 to 59.9 in adult 10/12/2018    REFERRING PROVIDER: Dr. Deward Curvin  REFERRING DIAG: Left breast cancer  THERAPY DIAG:  Malignant neoplasm of upper-outer quadrant  of left breast in female, estrogen receptor positive (HCC)  Abnormal posture  Aftercare following surgery for neoplasm  At risk for lymphedema  Rationale for Evaluation and Treatment: Rehabilitation  ONSET DATE: 11/10/23  SUBJECTIVE:                                                                                                                                                                                           SUBJECTIVE STATEMENT: I am doing really well.  Did not have to have drains.  I am using my compression.    PERTINENT HISTORY:  Post left lumpectomy with mastopexy and Rt reduction 01/31/24. 3 negative nodes removed.  Patient was diagnosed on 11/10/2023 with left grade 1 invasive ductal carcinoma breast  cancer. It measures 4 mm and is located in the upper outer quadrant. It is ER/PR positive and HER2 negative with a Ki67 of 1%.  Radiation is undecided.    PATIENT GOALS:  Reassess how my recovery is going related to arm function, pain, and swelling.  PAIN:  Are you having pain? No - the incisions are just tender    PRECAUTIONS: Recent Surgery, left UE Lymphedema risk,  RED FLAGS: None   ACTIVITY LEVEL / LEISURE: just not back to yard work yet and vacuum     OBJECTIVE:   PATIENT SURVEYS:  QUICK DASH: 2.27% from 0%    OBSERVATIONS: Using compression bra  POSTURE:  Rounded shoulders   LYMPHEDEMA ASSESSMENT:   UPPER EXTREMITY AROM/PROM:   A/PROM RIGHT   eval   02/22/24  Shoulder extension 57 55  Shoulder flexion 151 151  Shoulder abduction 157 160  Shoulder internal rotation 65 70  Shoulder external rotation 84 95                          (Blank rows = not tested)   A/PROM LEFT   eval 02/22/24  Shoulder extension 57 55  Shoulder flexion 135 155  Shoulder abduction 160 160  Shoulder internal rotation 77 75  Shoulder external rotation 87 95                          (Blank rows = not tested)   CERVICAL AROM: All within normal limits   UPPER EXTREMITY STRENGTH: WFL   LYMPHEDEMA ASSESSMENTS (in cm):    LANDMARK RIGHT   eval  10 cm proximal to olecranon process 38.2  Olecranon process 26  10 cm proximal to ulnar styloid process 24.6  Just proximal to ulnar styloid process 16.3  Across hand at thumb web space 18.7  At base of 2nd digit 6  (Blank rows = not tested)   LANDMARK LEFT   eval 02/22/24  10 cm proximal to olecranon process 42.8 43  Olecranon process 27.3 28  10  cm proximal to ulnar styloid process 25 26  Just proximal to ulnar styloid process 16.6 17.4  Across hand at thumb web space 18.9 19  At base of 2nd digit 6 6.1  (Blank rows = not tested)  PATIENT EDUCATION:  Education details: per below Person educated: Patient Education method:  Chief Technology Officer Education comprehension: verbalized understanding  HOME EXERCISE PROGRAM: Reviewed previously given post op HEP.   ASSESSMENT:  CLINICAL IMPRESSION: Pt is doing extremely well after lumpectomy with reduction.  She has no pain and has returned to full ROM.  She was educated on scar massage, compression, and lymphedema personal risk and surveillance.  No more PT visits are needed.   Pt will benefit from skilled therapeutic intervention to improve on the following deficits: Decreased knowledge of precautions, impaired UE functional use, pain, decreased ROM, postural dysfunction.   PT treatment/interventions: ADL/Self care home management, 97110-Therapeutic exercises, 97530- Therapeutic activity, [id number]- Neuromuscular re-education, 97535- Self Care, and 02859- Manual therapy   GOALS: Goals reviewed with patient? Yes  LONG TERM GOALS:  (STG=LTG)  GOALS Name Target Date  Goal status  1 Pt will demonstrate she has regained full shoulder ROM and function post operatively compared to baselines.  Baseline: 02/22/24 MET                    PLAN:  PT FREQUENCY/DURATION: SOZO  PLAN FOR NEXT SESSION: SOZO   [facility]  [address], [address]  [place] [address]  [phone]  After Breast Cancer Class Video It is recommended you view the ABC class video to be educated on lymphedema risk reduction. This video lasts for about 30 minutes. It can be viewed on our website here: [email or web address]  Scar massage You can begin gentle scar massage to you incision sites. Gently place one hand on the incision and move the skin (without sliding on the skin) in various directions. Do this for a few minutes and then you can gently massage either coconut oil or vitamin E cream into the scars.  Compression garment You should continue wearing your compression bra until you feel like  you no longer have swelling.  Home exercise Program Continue doing the exercises you were given until you feel like you can do them without feeling any tightness at the end.   Walking Program Studies show that 30 minutes of walking per day (fast enough to elevate your heart rate) can significantly reduce the risk of a cancer recurrence. If you can't walk due to other medical reasons, we encourage you to find another activity you could do (like a stationary bike or water exercise).  Posture After breast cancer surgery, people frequently sit with rounded shoulders posture because it puts their incisions on slack and feels better. If you sit like this and scar tissue forms in that position, you can become very tight and have pain sitting or standing with good posture. Try to be aware of your posture and sit and stand up tall to heal properly.  Follow up PT: It is recommended you return every 3 months for the first 3 years following surgery to be assessed on the SOZO machine for an L-Dex score. This helps prevent clinically significant lymphedema in 95% of patients. These follow up screens are  10 minute appointments that you are not billed for.  Larue Saddie SAUNDERS, PT 02/22/2024, 4:52 PM   PHYSICAL THERAPY DISCHARGE SUMMARY  Visits from Start of Care: 2  Current functional level related to goals / functional outcomes: See above   Remaining deficits: Lymphedema risk    Education / Equipment: Final plan  Plan: Patient agrees to discharge.  Patient is being discharged due to meeting the stated rehab goals.

## 2024-02-21 NOTE — Progress Notes (Unsigned)
 Patient is a pleasant 48 year old female s/p left breast lumpectomy with mastopexy and right breast oncoplastic reduction for symmetry 01/31/2024 by Dr. Curvin with Dr. Lowery who presents to clinic for postoperative follow-up.   She was last seen here in clinic on 02/17/2024.  At that time, there was a 1 cm incisional wound at the inferior aspect vertical limb right breast with some incisional slough.  Mild surrounding erythema, but no evidence concerning for infection.  Recommended Vashe soaked gauze and Vaseline.  Follow-up in 1 week for reevaluation.  FMLA situation addressed.  Today, patient is doing well.  She is accompanied by her husband at bedside.  She has been applying Vaseline, as discussed.  She is keeping the area clean.  She has some tenderness behind the right vertical limb incision, but the wound appears to be improving.  She denies any leg swelling, chest pain, difficulty breathing, fevers.  On exam, breasts have excellent shape and symmetry.  Soft throughout.  Small, less than 1 cm incisional wound right inferior T zone with some mild residual slough.  No deep thickness or tunneling.  Small incisional wound right lateral inframammary incision, as well, less than 0.5 cm with some contained slough.  Left inferior T zone also has less than 0.5 cm inferior T zone wound.  These wounds have minimal surrounding erythema, no induration.  No crepitus or fluctuance.  No palpable underlying fluid collections.  Overall, she is healing quite nicely considering she is just over 3 weeks postop from surgery.  Recommending Vashe soaked gauze application to the wounds once daily after the shower.  She can follow this with Vaseline and ABD pads.  Continue with compression for an additional 2 weeks.  Follow-up at that time for likely final postoperative encounter and photos.

## 2024-02-22 ENCOUNTER — Encounter: Admitting: Physician Assistant

## 2024-02-22 ENCOUNTER — Encounter: Payer: Self-pay | Admitting: *Deleted

## 2024-02-22 ENCOUNTER — Encounter: Payer: Self-pay | Admitting: Rehabilitation

## 2024-02-22 ENCOUNTER — Ambulatory Visit: Attending: General Surgery | Admitting: Rehabilitation

## 2024-02-22 DIAGNOSIS — C50412 Malignant neoplasm of upper-outer quadrant of left female breast: Secondary | ICD-10-CM | POA: Insufficient documentation

## 2024-02-22 DIAGNOSIS — Z9189 Other specified personal risk factors, not elsewhere classified: Secondary | ICD-10-CM | POA: Diagnosis present

## 2024-02-22 DIAGNOSIS — Z170 Estrogen receptor positive status [ER+]: Secondary | ICD-10-CM | POA: Diagnosis present

## 2024-02-22 DIAGNOSIS — Z483 Aftercare following surgery for neoplasm: Secondary | ICD-10-CM | POA: Diagnosis present

## 2024-02-22 DIAGNOSIS — R293 Abnormal posture: Secondary | ICD-10-CM | POA: Diagnosis present

## 2024-02-23 ENCOUNTER — Encounter (INDEPENDENT_AMBULATORY_CARE_PROVIDER_SITE_OTHER): Payer: Self-pay | Admitting: Family Medicine

## 2024-02-23 ENCOUNTER — Ambulatory Visit (INDEPENDENT_AMBULATORY_CARE_PROVIDER_SITE_OTHER): Admitting: Family Medicine

## 2024-02-23 DIAGNOSIS — C50212 Malignant neoplasm of upper-inner quadrant of left female breast: Secondary | ICD-10-CM | POA: Diagnosis not present

## 2024-02-23 DIAGNOSIS — F5089 Other specified eating disorder: Secondary | ICD-10-CM

## 2024-02-23 DIAGNOSIS — E669 Obesity, unspecified: Secondary | ICD-10-CM

## 2024-02-23 DIAGNOSIS — F3289 Other specified depressive episodes: Secondary | ICD-10-CM

## 2024-02-23 DIAGNOSIS — Z9884 Bariatric surgery status: Secondary | ICD-10-CM

## 2024-02-23 DIAGNOSIS — Z6841 Body Mass Index (BMI) 40.0 and over, adult: Secondary | ICD-10-CM

## 2024-02-23 NOTE — Progress Notes (Signed)
 Office: [phone]  /  Fax: [phone]  WEIGHT SUMMARY AND BIOMETRICS  Anthropometric Measurements Height: 5' 5 (1.651 m) Weight: 275 lb (124.7 kg) BMI (Calculated): 45.76 Weight at Last Visit: 271lb Weight Lost Since Last Visit: 0lb Weight Gained Since Last Visit: 4lb Starting Weight: 338lb Total Weight Loss (lbs): 63 lb (28.6 kg) Peak Weight: 358lb   Body Composition  Body Fat %: 55.1 % Fat Mass (lbs): 151.8 lbs Muscle Mass (lbs): 117.6 lbs Visceral Fat Rating : 18   Other Clinical Data Fasting: Yes Labs: no Today's Visit #: 28 Starting Date: 09/12/18    Chief Complaint: OBESITY    History of Present Illness Rebecca Dominguez is a 48 year old female who presents for obesity treatment and progress assessment. She is s/p gastric bypass 2001  She has been adhering to the prescribed category three obesity treatment plan about fifty percent of the time and is not currently engaging in physical exercise. Over the past month, she has experienced a weight gain of four pounds since her last visit.  She recently underwent breast cancer surgery and was told that the margins were clear and there was no lymph node involvement. She was told by her oncology team that chemotherapy would not be necessary. She has a post-operative appointment with the breast surgeon today. A plastic surgeon performed a breast lift and reduction for symmetry. She is wearing a compression bra post-surgery, which causes sensitivity and irritation, particularly on one side. She has been sleeping on her back, which has been difficult, but has been cleared to sleep on her side, improving her comfort. She alternates between sleeping on the couch and bed, depending on where she falls asleep first, and uses a wedge pillow to aid in sleeping on her back.  She has seven doctor appointments scheduled between this week and next, including with an oncologist and a radiation oncologist. She is concerned about  the potential risks of radiation therapy to her heart and lungs due to the cancer's location in the left breast and its impact on her upcoming trip to [place] .  She has been experiencing persistent headaches almost daily since her surgery, which sometimes subside with Excedrin Extra Strength. She is unsure if these are related to stress, anxiety, or hormonal changes post-surgery. She is hydrating well, primarily drinking water and coffee, and occasionally diet soda. She uses Liquid I.V. hydration multiplier to maintain hydration.  She is currently taking escitalopram , 20 mg and 10 mg together in the morning with breakfast, along with her vitamins. She has not required pain medication since the first week post-surgery, having used ibuprofen, Tylenol , and occasionally oxycodone  for pain management initially.      PHYSICAL EXAM:  Blood pressure 113/71, pulse 83, temperature 98.5 F (36.9 C), height 5' 5 (1.651 m), weight 275 lb (124.7 kg), last menstrual period 01/03/2024, SpO2 99%. Body mass index is 45.76 kg/m.  DIAGNOSTIC DATA REVIEWED:  BMET    Component Value Date/Time   NA 140 12/01/2023 1221   NA 141 03/31/2023 0829   K 4.7 12/01/2023 1221   CL 109 12/01/2023 1221   CO2 28 12/01/2023 1221   GLUCOSE 86 12/01/2023 1221   BUN 23 (H) 12/01/2023 1221   BUN 15 03/31/2023 0829   CREATININE 0.61 12/01/2023 1221   CALCIUM 9.5 12/01/2023 1221   GFRNONAA >60 12/01/2023 1221   GFRAA 115 12/27/2019 1150   Lab Results  Component Value Date   HGBA1C 5.3 03/31/2023   HGBA1C 5.3 09/12/2018  Lab Results  Component Value Date   INSULIN  5.5 03/31/2023   INSULIN  8.8 09/12/2018   Lab Results  Component Value Date   TSH 2.210 11/19/2022   CBC    Component Value Date/Time   WBC 5.9 12/01/2023 1221   RBC 4.43 12/01/2023 1221   HGB 13.8 12/01/2023 1221   HGB 13.5 11/19/2022 0842   HCT 40.2 12/01/2023 1221   HCT 42.7 11/19/2022 0842   PLT 178 12/01/2023 1221   PLT 204 11/19/2022  0842   MCV 90.7 12/01/2023 1221   MCV 94 11/19/2022 0842   MCH 31.2 12/01/2023 1221   MCHC 34.3 12/01/2023 1221   RDW 12.9 12/01/2023 1221   RDW 13.3 11/19/2022 0842   Iron Studies    Component Value Date/Time   IRON 54 06/05/2020 0000   TIBC 441 06/05/2020 0000   FERRITIN 17 09/12/2018 0957   IRONPCTSAT 12 06/05/2020 0000   Lipid Panel     Component Value Date/Time   CHOL 155 11/19/2022 0842   TRIG 36 11/19/2022 0842   HDL 73 11/19/2022 0842   CHOLHDL 2.3 01/28/2021 1115   LDLCALC 73 11/19/2022 0842   Hepatic Function Panel     Component Value Date/Time   PROT 6.6 12/01/2023 1221   PROT 6.0 03/31/2023 0829   ALBUMIN 4.0 12/01/2023 1221   ALBUMIN 3.9 03/31/2023 0829   AST 25 12/01/2023 1221   ALT 28 12/01/2023 1221   ALKPHOS 58 12/01/2023 1221   BILITOT 0.4 12/01/2023 1221      Component Value Date/Time   TSH 2.210 11/19/2022 0842   Nutritional Lab Results  Component Value Date   VD25OH 53.4 03/31/2023   VD25OH 36.3 11/19/2022   VD25OH 41.1 05/27/2022     Assessment and Plan Assessment & Plan Obesity Following the category three plan for obesity management about 50% of the time. Gained 4 pounds in the last month, likely due to postoperative changes and fluid retention. Focus is on recovery from breast cancer surgery, with weight management secondary. - Continue category three plan for obesity management. - Encourage increased physical activity as tolerated, focusing on walking when weather permits.   Breast cancer, left, hormone receptor positive, HER2 negative, post-lumpectomy Early-stage hormone receptor-positive, HER2-negative breast cancer. Post-lumpectomy with clear margins and no lymph node involvement. Chemotherapy ruled out due to low Oncotest score and age-related considerations. Considering radiation therapy necessity and risks, especially post-breast reduction surgery. - Consult with oncologist and radiation oncologist regarding the necessity and  risks of radiation therapy. - Discuss potential benefits and risks of radiation therapy, including heart and lung risks, with specialists. - Continue to eat anti-oxidant rich foods and adequate protein intake for healing  Postoperative state following left breast lumpectomy and reconstruction Three weeks post-lumpectomy and breast reconstruction, including lift and reduction for symmetry. Healing well with no signs of lymphedema, able to lift arms over head. Experiences sensitivity at incision sites and uses a compression bra. Cleared to sleep on her side, improving comfort. Concerned about incision healing for radiation therapy. - Continue wearing compression bra as advised. - Monitor incision healing and consult with specialists regarding readiness for radiation therapy.  depressive disorder with emotional eating behaviors Currently on escitalopram , taking both 20 mg and 10 mg doses together in the morning with breakfast. Reports feeling good on this regimen. - Continue current escitalopram  regimen, will continue to monitor and manage medications - Contact us  if mood worsens     She was informed of the importance of frequent follow  up visits to maximize her success with intensive lifestyle modifications for her multiple health conditions.    Louann Penton, MD

## 2024-02-24 ENCOUNTER — Encounter: Payer: Self-pay | Admitting: Physician Assistant

## 2024-02-24 ENCOUNTER — Inpatient Hospital Stay: Admitting: Hematology

## 2024-02-24 ENCOUNTER — Ambulatory Visit: Admitting: Physician Assistant

## 2024-02-24 VITALS — BP 123/78 | HR 73

## 2024-02-24 DIAGNOSIS — Z9889 Other specified postprocedural states: Secondary | ICD-10-CM

## 2024-02-24 DIAGNOSIS — C50212 Malignant neoplasm of upper-inner quadrant of left female breast: Secondary | ICD-10-CM

## 2024-02-24 DIAGNOSIS — Z170 Estrogen receptor positive status [ER+]: Secondary | ICD-10-CM

## 2024-02-27 NOTE — Assessment & Plan Note (Signed)
 pT1bN0M0, stage IA, ER+/PR+/HER2-, Oncotype RS 14 - Discovered by screening mammogram -Status post left breast lumpectomy and sentinel lymph node biopsy, which showed 1cm invasive ductal carcinoma, with negative margins and negative nodes. -Oncotype DX recurrence score 14, no benefit of adjuvant chemotherapy -She underwent adjuvant radiation -I recommend antiestrogen therapy with tamoxifen 

## 2024-02-28 ENCOUNTER — Inpatient Hospital Stay: Attending: Hematology | Admitting: Hematology

## 2024-02-28 VITALS — BP 138/88 | HR 87 | Temp 97.7°F | Resp 17 | Wt 280.3 lb

## 2024-02-28 DIAGNOSIS — Z1721 Progesterone receptor positive status: Secondary | ICD-10-CM | POA: Diagnosis not present

## 2024-02-28 DIAGNOSIS — Z170 Estrogen receptor positive status [ER+]: Secondary | ICD-10-CM | POA: Insufficient documentation

## 2024-02-28 DIAGNOSIS — I10 Essential (primary) hypertension: Secondary | ICD-10-CM | POA: Insufficient documentation

## 2024-02-28 DIAGNOSIS — Z1732 Human epidermal growth factor receptor 2 negative status: Secondary | ICD-10-CM | POA: Diagnosis not present

## 2024-02-28 DIAGNOSIS — Z79899 Other long term (current) drug therapy: Secondary | ICD-10-CM | POA: Diagnosis not present

## 2024-02-28 DIAGNOSIS — C50812 Malignant neoplasm of overlapping sites of left female breast: Secondary | ICD-10-CM | POA: Diagnosis present

## 2024-02-28 DIAGNOSIS — E669 Obesity, unspecified: Secondary | ICD-10-CM | POA: Insufficient documentation

## 2024-02-28 DIAGNOSIS — Z79810 Long term (current) use of selective estrogen receptor modulators (SERMs): Secondary | ICD-10-CM | POA: Insufficient documentation

## 2024-02-28 DIAGNOSIS — C50212 Malignant neoplasm of upper-inner quadrant of left female breast: Secondary | ICD-10-CM | POA: Diagnosis not present

## 2024-02-28 DIAGNOSIS — Z86718 Personal history of other venous thrombosis and embolism: Secondary | ICD-10-CM | POA: Insufficient documentation

## 2024-02-28 MED ORDER — TAMOXIFEN CITRATE 20 MG PO TABS: 20.0000 mg | ORAL_TABLET | Freq: Every day | ORAL | 2 refills | Status: DC

## 2024-02-28 NOTE — Progress Notes (Signed)
 Radiation Oncology         [phone] ________________________________  Name: Rebecca Dominguez MRN: [id number]  Date: 03/01/2024  DOB: Jul 17, 1976  Re-Evaluation Note  CC: [name], MD  Lanny Callander, MD    ICD-10-CM   1. Malignant neoplasm of upper-inner quadrant of left breast in female, estrogen receptor positive [facility])  C50.212    Z17.0       Diagnosis:  The encounter diagnosis was Malignant neoplasm of upper-inner quadrant of left breast in female, estrogen receptor positive (HCC).   Stage IA (cT1a, cN0, cM0) Left Breast UIQ, Invasive Ductal Carcinoma, ER+ / PR+ / Her2-, Grade 1  Narrative:  The patient returns today to discuss radiation treatment options. She was seen in the multidisciplinary breast clinic on 12/01/23.   Since consultation, she underwent genetic testing on 12/16/23. Results showed no pathogenic mutation contributing to her recent diagnosis.  Patient opted to proceed with a left breast lumpectomy with radioactive seed, w nodal biopsy on 01/31/24 under the care of Dr. Curvin. Pathology from the procedure revealed: tumor size of 1.0 x 0.4 x 0.2 cm. Histology of grade 1 invasive well-differentiated ductal adenocarcinoma and intermediate grade, solid and cribriform DCIS without necrosis. All margins are free of invasive disease, final inferior margin at least 8 mm, final superior margin at least 16 mm and final; DCIS final inferior margin at least 9.3 mm, final superior margin at least 19 mm. All examined lymph nodes are negative for malignancy. Prognostic indicators significant for: estrogen receptor Positive 70%, weak staining ; progesterone receptor positive 100%, strong staining; Proliferation marker Ki67 at 1%; Her2 status Group 5 negative by FISH ; Grade 1. Of note: patient also underwent a bilateral breast reduction at the time of her lumpectomy.  Oncotype DX was obtained on the final surgical sample and the recurrence score of 14 predicts a risk of recurrence  outside the breast over the next 9 years of 4%, if the patient's only systemic therapy is an antiestrogen for 5 years.  It also predicts no significant benefit from chemotherapy.  During her most recent post-op follow up with Dr. Curvin on 02/23/24, she reported recovering accordingly with no sign of infection or seroma.      She is also followed closely with plastic surgery who is pleased with her recovery at this point.  On review of systems, the patient reports some tenderness along the breast reduction scars in the inferior breast. She denies drainage or bleeding from any of her surgical scars.     Allergies:  has no known allergies.  Meds: Current Outpatient Medications  Medication Sig Dispense Refill   acetaminophen  (TYLENOL ) 500 MG tablet Take 500 mg by mouth every 6 (six) hours as needed.     aspirin-acetaminophen -caffeine (EXCEDRIN MIGRAINE) 250-250-65 MG tablet Take 1 tablet by mouth every 6 (six) hours as needed for headache.     cyanocobalamin  (VITAMIN B12) 500 MCG tablet Take 1 tablet (500 mcg total) by mouth daily. 30 tablet 2   escitalopram  (LEXAPRO ) 10 MG tablet Take 1 tablet (10 mg total) by mouth daily. 90 tablet 0   escitalopram  (LEXAPRO ) 20 MG tablet Take 1 tablet (20 mg total) by mouth daily. 90 tablet 0   ferrous sulfate 325 (65 FE) MG tablet Take 325 mg by mouth daily. (Patient taking differently: Take 325 mg by mouth every other day.)     Glucosamine-Chondroit-Vit C-Mn (GLUCOSAMINE CHONDR 1500 COMPLX) CAPS Take 1 capsule by mouth daily.     Multiple Vitamins-Minerals (  MULTIVITAMIN WOMEN) TABS Take 1 tablet by mouth daily.     naproxen (NAPROSYN) 500 MG tablet Take 500 mg by mouth 2 (two) times daily as needed.      vitamin C (ASCORBIC ACID) 500 MG tablet Take 500 mg by mouth daily.     tamoxifen  (NOLVADEX ) 20 MG tablet Take 1 tablet (20 mg total) by mouth daily. (Patient not taking: Reported on 03/01/2024) 30 tablet 2   No current facility-administered medications for  this encounter.    Physical Findings: The patient is in no acute distress. Patient is alert and oriented.  height is 5' 5 (1.651 m) and weight is 279 lb 12.8 oz (126.9 kg). Her temperature is 98.7 F (37.1 C). Her blood pressure is 155/97 (abnormal) and her pulse is 85. Her respiration is 19 and oxygen saturation is 99%.   Lungs are clear to auscultation bilaterally. Heart has regular rate and rhythm. No palpable cervical, supraclavicular, or axillary adenopathy. Abdomen soft, non-tender, normal bowel sounds. Right breast: Breast reduction scars noted without signs of drainage or infection Left breast: Breast reduction scars noted without signs of drainage or infection.  Lumpectomy scar noted in the upper inner aspect of the breast without signs of drainage or infection.  Lab Findings: Lab Results  Component Value Date   WBC 5.9 12/01/2023   HGB 13.8 12/01/2023   HCT 40.2 12/01/2023   MCV 90.7 12/01/2023   PLT 178 12/01/2023    Radiographic Findings: No results found.   Impression:  Stage IA (cT1a, cN0, cM0) Left Breast UIQ, Invasive Ductal Carcinoma, ER+ / PR+ / Her2-, Grade 1  She is recovering well from her surgeries.  Given the pathologic findings and her age would recommend adjuvant radiation therapy to decrease the chances of recurrence within the left breast.  I discussed the overall course of treatment anticipated side effects and potential long-term toxicities of left-sided breast cancer radiation treatment with the patient and her husband, she appears to understand and wishes to proceed with planned course of treatment.  We discussed 2 fractionation scheme's for management of her left-sided breast cancer.  Given her lumpectomy as well as extensive surgery associated with breast reduction I would recommend conventional radiation therapy over approximately 6 and half weeks.  We also discussed a shorter course of treatment i.e. hypofractionated accelerated radiation therapy over  approximately 20 treatments.  The patient and her husband have a  trip planned in mid October which they have delayed on 2 occasions.  Given this issue she would be more in favor of a hypofractionated accelerated course of radiation therapy over 4 weeks so that she can complete her radiation therapy well before her planned trip.  Plan:  Patient is scheduled for CT simulation later this week.  Treatments to begin approximately a week after this appointment.  Anticipate 15 treatments directed at the left breast followed by a boost to the lumpectomy cavity of 6 fractions given the close lateral margin on the invasive tumor as well as the DCIS component.  We use cardiac sparing techniques if necessary.  -----------------------------------  Lynwood CHARM Nasuti, PhD, MD   This document serves as a record of services personally performed by Lynwood Nasuti, MD. It was created on his behalf by Reymundo Cartwright, a trained medical scribe. The creation of this record is based on the scribe's personal observations and the provider's statements to them. This document has been checked and approved by the attending provider.

## 2024-02-28 NOTE — Progress Notes (Signed)
 Location of Breast Cancer:Malignant neoplasm of upper-inner quadrant of left breast in female, estrogen receptor positive (CMS/HHS-HCC)   Histology per Pathology Report:    Receptor Status: ER(positive), PR (positive), Her2-neu (Negative), Ki-67(1%)  Did patient present with symptoms (if so, please note symptoms) or was this found on screening mammography?:   Past/Anticipated interventions by surgeon, if any:  Past/Anticipated interventions by medical oncology, if any:    Lymphedema issues, if any:  no    Pain issues, if any:  No.  Skin: Surgical incisions are almost closed. Continues to have a small amount of drainage from right breast.     SAFETY ISSUES: Prior radiation? no Pacemaker/ICD? no Possible current pregnancy?no, LPM- patient had IUD after breast cancer diagnosis.  Is the patient on methotrexate? No  Current Complaints / other details:   BP (!) 155/97   Pulse 85   Temp 98.7 F (37.1 C)   Resp 19   Ht 5' 5 (1.651 m)   Wt 279 lb 12.8 oz (126.9 kg)   SpO2 99%   BMI 46.56 kg/m

## 2024-02-28 NOTE — Progress Notes (Signed)
 [facility]   Telephone:(336) [phone] Fax:(336) [phone]   Clinic Follow up Note   Patient Care Team: [name], MD as PCP - General (Family Medicine) Lanny Callander, MD as Consulting Physician (Hematology) Glean Stephane BROCKS, RN (Inactive) as Oncology Nurse Navigator Tyree Nanetta SAILOR, RN as Oncology Nurse Navigator Curvin Deward MOULD, MD as Consulting Physician (General Surgery) Shannon Agent, MD as Consulting Physician (Radiation Oncology)  Date of Service:  02/28/2024  CHIEF COMPLAINT: f/u of left breast cancer  CURRENT THERAPY:  Pending adjuvant radiation  Oncology History   Malignant neoplasm of upper-inner quadrant of left female breast (HCC) pT1bN0M0, stage IA, ER+/PR+/HER2-, Oncotype RS 14 - Discovered by screening mammogram -Status post left breast lumpectomy and sentinel lymph node biopsy, which showed 1cm invasive ductal carcinoma, with negative margins and negative nodes. -Oncotype DX recurrence score 14, no benefit of adjuvant chemotherapy -She underwent adjuvant radiation -I recommend antiestrogen therapy with tamoxifen   Assessment & Plan Estrogen receptor positive left breast cancer, status post lumpectomy Status post lumpectomy for a 1 cm well-differentiated tumor with clear margins and negative lymph nodes. Oncotype DX score of 14, indicating a low risk of distant recurrence. Chemotherapy not recommended. Discussed the role of radiation therapy and endocrine therapy in reducing recurrence risk. Radiation therapy is a local treatment, while endocrine therapy reduces the risk of distant recurrence. Tamoxifen  reduces the risk of distant recurrence by approximately 40%, lowering the risk from 6-8% to 4% over nine years. Discussed the potential side effects of tamoxifen , including hot flashes and its impact on metabolism. Discussed the option of oophorectomy, but given her perimenopausal status and negative genetic testing, it is not recommended. - Refer to  radiation oncologist for consultation regarding adjuvant radiation therapy. - Start tamoxifen  after completion of radiation therapy. - Monitor for side effects of tamoxifen , including hot flashes and metabolic changes. - Advise on lifestyle modifications to manage weight and monitor blood pressure, cholesterol, and blood sugar. - Discuss the potential need for aromatase inhibitors if she becomes postmenopausal while on tamoxifen .  Postoperative wound healing of right breast reduction incision Postoperative wound healing of right breast reduction incision with some opening at the intersection of incisions. No signs of infection. Healing is progressing well. - Use a pad to protect the incision from compression bra irritation. - Allow additional time for healing before starting radiation therapy if necessary.  History of deep vein thrombosis Discussed the risk of blood clots with tamoxifen  use, which is approximately 4-5%. - Advise on maintaining an active lifestyle to reduce the risk of blood clots. - Encourage regular movement during long periods of sitting or travel. - Monitor for signs of deep vein thrombosis.  Hypertension Discussed the potential impact of tamoxifen  on blood pressure. - Monitor blood pressure regularly, especially after starting tamoxifen .  Obesity, under active management Obesity under active management with weight loss program. Discussed the impact of tamoxifen  on metabolism and the importance of lifestyle modifications. - Continue with weight management program, focusing on low-carb diet and regular exercise. - Monitor weight regularly, especially after starting tamoxifen .   Plan - Surgical path and Oncotype results reviewed - She will meet Dr. Shannon later this week to discuss adjuvant radiation, she has multiple questions, I answered to the best of my knowledge - I recommend adjuvant tamoxifen  20 mg daily for 10 years, starting a few weeks after she completes  adjuvant radiation.  Benefit and potential side effects discussed with her, she agrees.  I called in today - Survivorship  in 4 months, lab and follow-up with me in 7 months  SUMMARY OF ONCOLOGIC HISTORY: Oncology History  Malignant neoplasm of upper-inner quadrant of left female breast (HCC)  11/25/2023 Cancer Staging   Staging form: Breast, AJCC 8th Edition - Clinical stage from 11/25/2023: Stage IA (cT1a, cN0, cM0, G1, ER+, PR+, HER2-) - Signed by Lanny Callander, MD on 12/01/2023 Stage prefix: Initial diagnosis Histologic grading system: 3 grade system   11/29/2023 Initial Diagnosis   Malignant neoplasm of upper-inner quadrant of left female breast (HCC)   12/07/2023 Genetic Testing   Negative genetic testing on the BRCAPlus panel and CancerNext-Expanded+RNAinsight panel  LZTR1 VUS identified.  The report date is May 20, and Dec 16, 2023.    The CancerNext-Expanded gene panel offered by [facility] and includes sequencing, rearrangement, and RNA analysis for the following 77 genes: AIP, ALK, APC, ATM, BAP1, BARD1, BMPR1A, BRCA1, BRCA2, BRIP1, CDC73, CDH1, CDK4, CDKN1B, CDKN2A, CEBPA, CHEK2, CTNNA1, DDX41, DICER1, ETV6, FH, FLCN, GATA2, LZTR1, MAX, MBD4, MEN1, MET, MLH1, MSH2, MSH3, MSH6, MUTYH, NF1, NF2, NTHL1, PALB2, PHOX2B, PMS2, POT1, PRKAR1A, PTCH1, PTEN, RAD51C, RAD51D, RB1, RET, RPS20, RUNX1, SDHA, SDHAF2, SDHB, SDHC, SDHD, SMAD4, SMARCA4, SMARCB1, SMARCE1, STK11, SUFU, TMEM127, TP53, TSC1, TSC2, VHL, and WT1 (sequencing and deletion/duplication); AXIN2, CTNNA1, DDX41, EGFR, HOXB13, KIT, MBD4, MITF, MSH3, PDGFRA, POLD1 and POLE (sequencing only); EPCAM and GREM1 (deletion/duplication only). RNA data is routinely analyzed for use in variant interpretation for all genes. The BRCAPlus gene panel offered by [facility] and includes sequencing and rearrangement analysis for the following 13 genes: ATM, BARD1, BRCA1, BRCA2, CDH1, CHEK2, NF1, PALB2, PTEN, RAD51C, RAD51D, STK11 and TP53.   01/31/2024  Cancer Staging   Staging form: Breast, AJCC 8th Edition - Pathologic stage from 01/31/2024: Stage IA (pT1b, pN0, cM0, G1, ER+, PR+, HER2-) - Signed by Lanny Callander, MD on 02/27/2024 Stage prefix: Initial diagnosis Histologic grading system: 3 grade system Residual tumor (R): R0      Discussed the use of AI scribe software for clinical note transcription with the patient, who gave verbal consent to proceed.  History of Present Illness Rebecca Dominguez is a 48 year old female with breast cancer who presents for follow-up after lumpectomy.  She underwent a lumpectomy on January 31, 2024, for a 1 cm well-differentiated tumor in her left breast. Post-surgery, she experiences minimal pain and manages a slight opening at the incision site with home care, without signs of infection.  She is considering radiation therapy and is concerned about the proximity of the tumor to her heart and potential side effects. She is contemplating the timing of radiation in relation to her healing process.  Her IUD was removed after her diagnosis, leading to some spotting but no full menstrual period. Occasional hot flashes and sweating episodes occur, possibly due to perimenopausal symptoms. Hormone levels post-IUD removal are consistent with previous levels, indicating she is not yet in menopause.  She takes 30 mg of lisinopril daily and is actively working on weight reduction. She has a history of three DVTs and stays active to prevent further occurrences, despite working a desk job.     All other systems were reviewed with the patient and are negative.  MEDICAL HISTORY:  Past Medical History:  Diagnosis Date   Anemia    low iron   Back pain    Breast mass    Family history of brain cancer    Family history of breast cancer    Family history of prostate cancer  Foot pain    from previous fracture of toe 2010   Gallbladder problem    removed   Headache    occasional   Hypertension    no longer on  medications since weight loss   Joint pain    Obesity    Plantar fasciitis    Vitamin B 12 deficiency    Vitamin D  deficiency     SURGICAL HISTORY: Past Surgical History:  Procedure Laterality Date   BREAST BIOPSY Left 11/25/2023   US  LT BREAST BX W LOC DEV 1ST LESION IMG BX SPEC US  GUIDE 11/25/2023 [facility]MO[facility]   BREAST BIOPSY  01/28/2024   MM LT RADIOACTIVE SEED LOC MAMMO GUIDE 01/28/2024 [facility] MAMMO[facility]   BREAST LUMPECTOMY WITH RADIOACTIVE SEED AND SENTINEL LYMPH NODE BIOPSY Left 01/31/2024   Procedure: BREAST LUMPECTOMY WITH RADIOACTIVE SEED AND SENTINEL LYMPH NODE BIOPSY;  Surgeon: Curvin Deward MOULD, MD;  Location: [facility];  Service: General;  Laterality: Left;  GEN w/PEC BLOCK LEFT BREAST RADIOACTIVE SEED LOCALIZED LUMPECTOMY AND SENTINEL NODE BIOPSY   BREAST REDUCTION SURGERY Bilateral 01/31/2024   Procedure: BREAST REDUCTION;  Surgeon: Lowery Estefana RAMAN, DO;  Location: [facility];  Service: Plastics;  Laterality: Bilateral;   CHOLECYSTECTOMY  2013   COLONOSCOPY WITH PROPOFOL  N/A 03/06/2016   Procedure: COLONOSCOPY WITH PROPOFOL ;  Surgeon: Elsie Cree, MD;  Location[facility];  Service: Endoscopy;  Laterality: N/A;   gastric bypass surgery  2001   PILONIDAL CYST EXCISION     WISDOM TOOTH EXTRACTION      I have reviewed the social history and family history with the patient and they are unchanged from previous note.  ALLERGIES:  has no known allergies.  MEDICATIONS:  Current Outpatient Medications  Medication Sig Dispense Refill   tamoxifen  (NOLVADEX ) 20 MG tablet Take 1 tablet (20 mg total) by mouth daily. 30 tablet 2   acetaminophen  (TYLENOL ) 500 MG tablet Take 500 mg by mouth every 6 (six) hours as needed.     aspirin-acetaminophen -caffeine (EXCEDRIN MIGRAINE) 250-250-65 MG tablet Take 1 tablet by mouth every 6 (six) hours as needed for headache.     cyanocobalamin  (VITAMIN B12) 500 MCG tablet Take 1 tablet (500 mcg total) by  mouth daily. 30 tablet 2   escitalopram  (LEXAPRO ) 10 MG tablet Take 1 tablet (10 mg total) by mouth daily. 90 tablet 0   escitalopram  (LEXAPRO ) 20 MG tablet Take 1 tablet (20 mg total) by mouth daily. 90 tablet 0   ferrous sulfate 325 (65 FE) MG tablet Take 325 mg by mouth daily.     Glucosamine-Chondroit-Vit C-Mn (GLUCOSAMINE CHONDR 1500 COMPLX) CAPS Take 1 capsule by mouth daily.     Multiple Vitamins-Minerals (MULTIVITAMIN WOMEN) TABS Take 1 tablet by mouth daily.     naproxen (NAPROSYN) 500 MG tablet Take 500 mg by mouth 2 (two) times daily as needed.      vitamin C (ASCORBIC ACID) 500 MG tablet Take 500 mg by mouth daily.     No current facility-administered medications for this visit.    PHYSICAL EXAMINATION: ECOG PERFORMANCE STATUS: 0 - Asymptomatic  Vitals:   02/28/24 0807  BP: 138/88  Pulse: 87  Resp: 17  Temp: 97.7 F (36.5 C)  SpO2: 97%   Wt Readings from Last 3 Encounters:  02/28/24 280 lb 4.8 oz (127.1 kg)  02/23/24 275 lb (124.7 kg)  02/17/24 280 lb 9.6 oz (127.3 kg)     GENERAL:alert, no distress and comfortable SKIN: skin color, texture,  turgor are normal, no rashes or significant lesions EYES: normal, Conjunctiva are pink and non-injected, sclera clear NECK: supple, thyroid normal size, non-tender, without nodularity LYMPH:  no palpable lymphadenopathy in the cervical, axillary  LUNGS: clear to auscultation and percussion with normal breathing effort HEART: regular rate & rhythm and no murmurs and no lower extremity edema ABDOMEN:abdomen soft, non-tender and normal bowel sounds Musculoskeletal:no cyanosis of digits and no clubbing  NEURO: alert & oriented x 3 with fluent speech, no focal motor/sensory deficits BREAST: Left breast incision from lumpectomy healed well. Right breast incision from reduction surgery slightly open. No seroma felt post-surgery. Physical Exam   LABORATORY DATA:  I have reviewed the data as listed    Latest Ref Rng & Units  12/01/2023   12:21 PM 11/19/2022    8:42 AM 05/27/2022    7:38 AM  CBC  WBC 4.0 - 10.5 K/uL 5.9  3.5  4.1   Hemoglobin 12.0 - 15.0 g/dL 86.1  86.4  87.2   Hematocrit 36.0 - 46.0 % 40.2  42.7  38.9   Platelets 150 - 400 K/uL 178  204  208         Latest Ref Rng & Units 12/01/2023   12:21 PM 03/31/2023    8:29 AM 11/19/2022    8:42 AM  CMP  Glucose 70 - 99 mg/dL 86  78  83   BUN 6 - 20 mg/dL 23  15  18    Creatinine 0.44 - 1.00 mg/dL 9.38  9.40  9.33   Sodium 135 - 145 mmol/L 140  141  143   Potassium 3.5 - 5.1 mmol/L 4.7  4.3  4.3   Chloride 98 - 111 mmol/L 109  105  105   CO2 22 - 32 mmol/L 28  25  18    Calcium 8.9 - 10.3 mg/dL 9.5  9.3  89.9   Total Protein 6.5 - 8.1 g/dL 6.6  6.0  6.5   Total Bilirubin 0.0 - 1.2 mg/dL 0.4  0.4  0.3   Alkaline Phos 38 - 126 U/L 58  65  72   AST 15 - 41 U/L 25  28  28    ALT 0 - 44 U/L 28  32  32       RADIOGRAPHIC STUDIES: I have personally reviewed the radiological images as listed and agreed with the findings in the report. No results found.    No orders of the defined types were placed in this encounter.  All questions were answered. The patient knows to call the clinic with any problems, questions or concerns. No barriers to learning was detected. The total time spent in the appointment was 30 minutes, including review of chart and various tests results, discussions about plan of care and coordination of care plan     Onita Mattock, MD 02/28/2024

## 2024-03-01 ENCOUNTER — Encounter: Payer: Self-pay | Admitting: Radiation Oncology

## 2024-03-01 ENCOUNTER — Ambulatory Visit
Admission: RE | Admit: 2024-03-01 | Discharge: 2024-03-01 | Disposition: A | Discharge status: Home / Self Care | Source: Ambulatory Visit | Attending: Radiation Oncology | Admitting: Radiation Oncology

## 2024-03-01 VITALS — BP 155/97 | HR 85 | Temp 98.7°F | Resp 19 | Ht 65.0 in | Wt 279.8 lb

## 2024-03-01 DIAGNOSIS — Z1721 Progesterone receptor positive status: Secondary | ICD-10-CM | POA: Diagnosis not present

## 2024-03-01 DIAGNOSIS — C50212 Malignant neoplasm of upper-inner quadrant of left female breast: Secondary | ICD-10-CM | POA: Insufficient documentation

## 2024-03-01 DIAGNOSIS — Z170 Estrogen receptor positive status [ER+]: Secondary | ICD-10-CM | POA: Diagnosis not present

## 2024-03-01 DIAGNOSIS — Z79899 Other long term (current) drug therapy: Secondary | ICD-10-CM | POA: Insufficient documentation

## 2024-03-01 DIAGNOSIS — Z1732 Human epidermal growth factor receptor 2 negative status: Secondary | ICD-10-CM | POA: Insufficient documentation

## 2024-03-02 ENCOUNTER — Ambulatory Visit
Admission: RE | Admit: 2024-03-02 | Discharge: 2024-03-02 | Disposition: A | Discharge status: Home / Self Care | Source: Ambulatory Visit | Attending: Radiation Oncology | Admitting: Radiation Oncology

## 2024-03-02 ENCOUNTER — Ambulatory Visit (INDEPENDENT_AMBULATORY_CARE_PROVIDER_SITE_OTHER): Admitting: Physician Assistant

## 2024-03-02 VITALS — BP 111/76 | HR 81 | Temp 98.4°F

## 2024-03-02 DIAGNOSIS — C50212 Malignant neoplasm of upper-inner quadrant of left female breast: Secondary | ICD-10-CM | POA: Diagnosis present

## 2024-03-02 DIAGNOSIS — Z170 Estrogen receptor positive status [ER+]: Secondary | ICD-10-CM

## 2024-03-02 DIAGNOSIS — Z1732 Human epidermal growth factor receptor 2 negative status: Secondary | ICD-10-CM | POA: Insufficient documentation

## 2024-03-02 DIAGNOSIS — Z1721 Progesterone receptor positive status: Secondary | ICD-10-CM | POA: Insufficient documentation

## 2024-03-02 DIAGNOSIS — Z9889 Other specified postprocedural states: Secondary | ICD-10-CM

## 2024-03-02 DIAGNOSIS — Z510 Encounter for antineoplastic radiation therapy: Secondary | ICD-10-CM | POA: Diagnosis present

## 2024-03-02 MED ORDER — SULFAMETHOXAZOLE-TRIMETHOPRIM 800-160 MG PO TABS: 1.0000 | ORAL_TABLET | Freq: Two times a day (BID) | ORAL | 0 refills | Status: AC

## 2024-03-02 MED ORDER — SULFAMETHOXAZOLE-TRIMETHOPRIM 800-160 MG PO TABS: 1.0000 | ORAL_TABLET | Freq: Two times a day (BID) | ORAL | 0 refills | Status: DC

## 2024-03-02 NOTE — Progress Notes (Signed)
 Patient is a pleasant 48 year old female s/p left breast lumpectomy with mastopexy and right breast oncoplastic reduction for symmetry 01/31/2024 by Dr. Curvin with Dr. Lowery who presents to clinic for postoperative follow-up.   She was last seen here in clinic on 02/24/2024.  At that time, there was a small less than 1 cm incisional wound right inferior T-zone with mild residual slough.  No deep thickness or tunneling.  There was also a small incisional wound right lateral inframammary incision less than 0.5 cm with some contained slough.  Left inferior T-zone had less than 0.5 cm inferior T-zone wound, as well.  Recommended Vashe soaked gauze application to the wounds daily followed by Vaseline and ABD pads.  Follow-up in 2 weeks.  Today, patient states that yesterday she developed acute onset redness and drainage from her left lumpectomy site.  This prompted concern for infection.  She also has noticed scant drainage from inframammary incisions bilaterally.  She denies any significant pain, tenderness, fevers, chills, or other concerns.  She is hoping that this will resolve soon given that she has plans for left breast radiation treatment at the end of the month.  On exam, breasts have excellent shape and symmetry.  NAC's are healthy and viable.  Relatively soft throughout with the exception of an area of firmness underneath the left breast lumpectomy site.  No fluctuance or crepitus.  Possible fat necrosis versus simply related to the deep tissue closure.  There is a mild amount of cloudy appearing drainage from the lateral aspect of lumpectomy incision.  Query liquefied fat necrosis versus suture abscess versus infection.  There is a moderate amount of surrounding erythema, but no induration or overt cellulitis.  Area is without any significant TTP.  Appears more reflective of irritation.  Her small incisional wounds at inferior T-zone's bilaterally still have mild amount of residual slough, but less than  1 cm in size.  Will start patient on Bactrim .  While her small incisional wounds from the reduction are not particularly alarming or concerning for infection, the new onset redness at the left breast lumpectomy site with drainage on exam does incite concern.  It may simply be liquefying fat necrosis given that there is a mild area of firmness underneath.  Low suspicion for abscess at this time.  Regardless, at least it is spontaneously draining.  Would not attempt I&D.  Instead, continued compression and broad-spectrum oral antibiotics.  Patient is understanding agreeable to this plan.  Follow-up next week.  She understands to call the office should she have any new or worsening symptoms in interim.  Picture(s) obtained of the patient and placed in the chart were with the patient's or guardian's permission.

## 2024-03-09 ENCOUNTER — Encounter: Payer: Self-pay | Admitting: Surgical

## 2024-03-09 ENCOUNTER — Telehealth: Payer: Self-pay

## 2024-03-09 ENCOUNTER — Other Ambulatory Visit: Payer: Self-pay | Admitting: Radiology

## 2024-03-09 ENCOUNTER — Encounter: Payer: Self-pay | Admitting: *Deleted

## 2024-03-09 ENCOUNTER — Ambulatory Visit: Admitting: Surgical

## 2024-03-09 VITALS — BP 126/78 | HR 80 | Ht 65.0 in | Wt 279.4 lb

## 2024-03-09 DIAGNOSIS — C50212 Malignant neoplasm of upper-inner quadrant of left female breast: Secondary | ICD-10-CM

## 2024-03-09 DIAGNOSIS — Z170 Estrogen receptor positive status [ER+]: Secondary | ICD-10-CM

## 2024-03-09 DIAGNOSIS — Z9889 Other specified postprocedural states: Secondary | ICD-10-CM

## 2024-03-09 NOTE — Telephone Encounter (Signed)
 Faxed wound care supply request to [facility]  with confirmed receipt.  Requesting Collagen, adaptic, surgilube, and mepilex border dressing to be changed daily. Contacted patient to make aware and she conveyed understanding.

## 2024-03-09 NOTE — Progress Notes (Cosign Needed Addendum)
 Patient is a 48 year old female who underwent left breast lumpectomy followed by left breast mastopexy and right breast plastic reduction for symmetry.  She was seen here in the office 1 week ago, had redness of her left breast.  The area has subsequently opened and she now has a wound of the left lumpectomy incision.   Patient reports she is scheduled for radiation Monday of the left breast.  Chaperone present on exam On exam bilateral NAC's are viable, left breast lumpectomy site with erythema, wound present with exposed subcutaneous tissue.  There is some evidence of previous cellulitic changes with skin peeling.  There is no active drainage.  She does have some clear serous drainage from the left axillary incision.  Right breast incisions are overall intact and appear to be healing well, she does have a small T-junction wound inferiorly that is about 4 x 4 mm.  There is some fibrinous exudate.  No purulence or surrounding erythema noted.  Wound of medial left breast is 0.6 x 1.5 x 0.2 cm.   A/P:  48 year old female with history of left breast lumpectomy with now open wound of left breast lumpectomy incision and evidence of previous infection.  Does not look overtly infected today on exam.  Patient was prescribed 10 days of Bactrim  at her last appointment 7 days ago.  Recommend continuing this.  Discussed with patient that I will notify Dr. Lowery of physical exam findings.  I will also notify radiation oncology of the open wound.  Radiation may need to be delayed.  Discussed with patient various options for treatment including delayed healing via secondary intention or reexcision with primary closure.  Also notified Dr. Curvin with general surgery and Dr. Shannon with radiation oncology of findings today to coordinate care in regards to radiation.  Pictures were obtained of the patient and placed in the chart with the patient's or guardian's permission.

## 2024-03-13 ENCOUNTER — Ambulatory Visit: Admitting: Radiation Oncology

## 2024-03-13 NOTE — Progress Notes (Unsigned)
 Patient is a pleasant 48 year old female s/p left breast lumpectomy with mastopexy and right breast oncoplastic reduction for symmetry 01/31/2024 by Dr. Curvin with Dr. Lowery who presents to clinic for postoperative follow-up.   She was last seen here in clinic on 03/09/2024.  At that time, her left breast lumpectomy site had opened with wound noted, exposing subcutaneous tissue.  Some evidence of residual skin peeling from her cellulitis.  Wound measured 0.6 x 1.5 x 0.2 cm.  Recommended that she continue with the Bactrim  previously prescribed.  Radiation oncology was notified of the open wound.  It may need to be delayed until there is further wound healing.  Per note, Dr. Curvin and Dr. Shannon were notified and updated of patient's postoperative complication.  Today, patient was a bit overwhelmed.  She received a box from [facility]  wound care supplies and was unclear on what to do with it.  She received bordered Mepilex dressings, collagen foam, manuka honey foam, and K-Y jelly.  There was no Adaptic despite it being requested by last evaluating provider.  Patient was then receiving different instructions from different practices involved in her care regarding wound care.  She admits that she is a slow healer, but is hoping that this can all be expedited given need for radiation treatment.  She denies any pain, redness, fevers, or other systemic symptoms.  She states that she still has drainage from the lumpectomy site, but no considerable drainage from the inframammary wounds.  On exam, breasts have good shape and symmetry.  NAC's are healthy.  The lumpectomy site fully dehisced, now measures 3 x 1 x 0.25 cm.  The skin is mobilized over the underlying pedicle and there is no good granulation yet at this point.  Similarly, she has 1 cm incisional wound at the inferior T-zone bilaterally that also is without good underlying granulation.  There is no surrounding erythema or cellulitic changes throughout either  breast.  Recommending the manuka honey foam pad which is absorbent and good for moderate to high exudate wounds over the lumpectomy site which is reportedly still draining.  This can then be covered with a bordered Mepilex dressing.  Similarly, recommend bordered Mepilex dressings for inferior T zone wounds.  They need to heal up a bit before starting collagen.  There is still some residual slough at the right inferior T-zone wound.  See pictures.  Continue with Vashe soaked gauze daily.  Follow-up in 7 to 10 days, sooner if needed.  She understands that these wound will take time to heal.  Increase protein intake in interim.  Picture(s) obtained of the patient and placed in the chart were with the patient's or guardian's permission.

## 2024-03-14 ENCOUNTER — Ambulatory Visit (INDEPENDENT_AMBULATORY_CARE_PROVIDER_SITE_OTHER): Admitting: Physician Assistant

## 2024-03-14 ENCOUNTER — Ambulatory Visit

## 2024-03-14 VITALS — BP 121/78 | HR 71

## 2024-03-14 DIAGNOSIS — Z170 Estrogen receptor positive status [ER+]: Secondary | ICD-10-CM

## 2024-03-14 DIAGNOSIS — C50212 Malignant neoplasm of upper-inner quadrant of left female breast: Secondary | ICD-10-CM

## 2024-03-14 DIAGNOSIS — Z510 Encounter for antineoplastic radiation therapy: Secondary | ICD-10-CM | POA: Diagnosis not present

## 2024-03-15 ENCOUNTER — Ambulatory Visit

## 2024-03-16 ENCOUNTER — Ambulatory Visit

## 2024-03-16 ENCOUNTER — Encounter: Admitting: Student

## 2024-03-17 ENCOUNTER — Ambulatory Visit

## 2024-03-21 ENCOUNTER — Ambulatory Visit: Admitting: Radiation Oncology

## 2024-03-22 ENCOUNTER — Ambulatory Visit

## 2024-03-23 ENCOUNTER — Ambulatory Visit: Admitting: Radiation Oncology

## 2024-03-24 ENCOUNTER — Ambulatory Visit

## 2024-03-24 ENCOUNTER — Ambulatory Visit (INDEPENDENT_AMBULATORY_CARE_PROVIDER_SITE_OTHER): Admitting: Student

## 2024-03-24 VITALS — BP 129/84 | HR 77

## 2024-03-24 DIAGNOSIS — C50212 Malignant neoplasm of upper-inner quadrant of left female breast: Secondary | ICD-10-CM

## 2024-03-24 DIAGNOSIS — Z170 Estrogen receptor positive status [ER+]: Secondary | ICD-10-CM

## 2024-03-24 NOTE — Progress Notes (Signed)
 Patient is a 48 year old female with history of left breast cancer and breast asymmetry.  She most recently underwent a left breast lumpectomy and left sentinel lymph node biopsy with Dr. Curvin followed by immediate left breast mastopexy and right breast reduction with Dr. Lowery on 01/31/2024.  Patient is almost 7 weeks postop.  She presents to the clinic today for postoperative follow-up.  Patient was last seen in the clinic on 03/14/2024.  At this visit, patient reported she felt a bit overwhelmed.  She reported that she had received supplies from [facility]  but was unsure what to do with it.  On exam, breasts had good shape and symmetry.  The lumpectomy site had fully dehisced and was 3 x 1 x 0.25 cm.  There is no good granulation tissue yet.  There is recommended that patient apply manuka honey foam pad over the lumpectomy site and recommended that she apply Mepilex border dressings over the inferior T-zone's.  Today, patient reports she is overall doing well.  She states that the wounds are still present and that she has been doing her dressing changes as prescribed.  She denies any significant drainage from the wounds.  She denies any fevers or chills.  She states that her persistent wounds are delaying her radiation.  She states that she has another appointment with radiation in a few weeks for reevaluation.  Chaperone present on exam.  On exam, patient is sitting upright in no acute distress.  Breasts are overall soft and symmetric.  There is no overlying erythema to either breast, no obvious fluid collections palpated on exam.  NAC's appear to be healthy.  To the lumpectomy site, the wound is approximately 3 x 1 cm.  Medially, it does track about it half a centimeter medially.  There is no active drainage.  There appears to be healthy appearing tissue throughout the wound.  No surrounding erythema.  To the left inferior T-zone, there is a 1-1/2 x 0.5 cm wound with no active drainage or surrounding  erythema.  There is a small pinpoint wound near this wound.  To the right inferior T-zone there is a 1 x 1 cm wound.  There is no granular tissue noted to either of the T-zone wounds.  There are no overt signs of infection on exam.  Donated myriad was placed to the lumpectomy wound and covered with Adaptic, K-Y jelly and a Mepilex border dressing.  I discussed with the patient that I would like her to apply K-Y jelly to the Adaptic over the lumpectomy site daily for the next few days and allow the myriad to incorporate.  I discussed with her that on Monday night or Tuesday she may remove the dressing and apply a new piece of Adaptic, K-Y jelly and a new dressing and may clean the wound with Vashe.  I would like her to do this until she sees us  next.  She expressed understanding.  I discussed with the patient that she may put the manuka honey over the inferior T-zone wounds.  I discussed with her to clean the wounds with Vashe and apply the manuka honey dressing daily.  She expressed understanding.  I discussed with the patient that she no longer has to wear compression at this time.  I discussed with her that she may transition to a regular bra without underwire.  Patient expressed understanding.  I would like the patient to follow back up next week.  I instructed her to call in the meantime if she has  any questions or concerns about anything.  Pictures were obtained of the patient and placed in the chart with the patient's or guardian's permission.

## 2024-03-27 ENCOUNTER — Ambulatory Visit

## 2024-03-28 ENCOUNTER — Ambulatory Visit (INDEPENDENT_AMBULATORY_CARE_PROVIDER_SITE_OTHER): Admitting: Student

## 2024-03-28 ENCOUNTER — Ambulatory Visit

## 2024-03-28 ENCOUNTER — Ambulatory Visit: Admitting: Radiation Oncology

## 2024-03-28 DIAGNOSIS — C50212 Malignant neoplasm of upper-inner quadrant of left female breast: Secondary | ICD-10-CM

## 2024-03-28 DIAGNOSIS — Z170 Estrogen receptor positive status [ER+]: Secondary | ICD-10-CM

## 2024-03-28 NOTE — Progress Notes (Signed)
 Patient is a 48 year old female with history of left breast cancer and breast asymmetry. She most recently underwent a left breast lumpectomy and left sentinel lymph node biopsy with Dr. Curvin followed by immediate left breast mastopexy and right breast reduction with Dr. Lowery on 01/31/2024.  Patient is 8 weeks postop.  She presents to the clinic today for further postoperative follow-up.  Patient was last seen in the clinic on 03/24/2024.  At this visit, patient was doing well.  On exam, to the lumpectomy site, the wound was approximately 3 x 1 cm.  It did track about half a center medially.  There is no active drainage.  There is healthy appearing tissue throughout the wound.  To the inferior T zones there were small wounds noted.  Donated myriad was placed to the lumpectomy site.  Recommended that patient apply her manuka honey dressings over the inferior T-zone wounds.  Today, patient reports she is doing well.  She states that she feels that there was an improvement in the wound to her left lumpectomy site, but feels that the wound to her inferior T-zone may have gotten a little bit larger.  She denies any fevers or chills.  Denies any other new issues or concerns at this time.  She states that she has an appointment with radiation oncology next week for reevaluation of when to start her radiation.  Chaperone present on exam.  On exam, patient is sitting upright in no acute distress.  Breasts are soft and symmetric.  There is no overlying erythema.  No obvious fluid collections palpated on exam.  NAC's appear to be healthy.  Wound to the left lumpectomy site appears to have healthy appearing tissue throughout the wound.  There is a small pinpoint opening to the medial aspect of the wound.  There is no active drainage on exam.  Wound to the left inferior T-zone is approximately 2 cm x 1 cm.  There is a small pinpoint wound just medial of this wound.  There appears to be healthy appearing tissue  throughout the wound.  Wound still present to the right inferior T-zone, appears similar to previous exam.  There are no signs of infection on exam.  Recommended that patient continue to clean the wound with Vashe daily.  Recommended that she continue with Adaptic and K-Y jelly dressing changes daily to the left lumpectomy site wound and continue with her manuka honey foam pads to the inferior T-zone's daily.  She expressed understanding.  Patient to follow back up next week.  I instructed her to call in the meantime if she has any questions or concerns about anything.  Pictures were obtained of the patient and placed in the chart with the patient's or guardian's permission.

## 2024-03-29 ENCOUNTER — Ambulatory Visit

## 2024-03-30 ENCOUNTER — Ambulatory Visit

## 2024-03-31 ENCOUNTER — Ambulatory Visit

## 2024-04-03 ENCOUNTER — Ambulatory Visit

## 2024-04-04 ENCOUNTER — Ambulatory Visit

## 2024-04-04 ENCOUNTER — Ambulatory Visit: Admitting: Radiation Oncology

## 2024-04-05 ENCOUNTER — Ambulatory Visit

## 2024-04-06 ENCOUNTER — Ambulatory Visit (INDEPENDENT_AMBULATORY_CARE_PROVIDER_SITE_OTHER): Admitting: Student

## 2024-04-06 ENCOUNTER — Ambulatory Visit

## 2024-04-06 DIAGNOSIS — Z9889 Other specified postprocedural states: Secondary | ICD-10-CM

## 2024-04-06 DIAGNOSIS — C50212 Malignant neoplasm of upper-inner quadrant of left female breast: Secondary | ICD-10-CM

## 2024-04-06 DIAGNOSIS — Z170 Estrogen receptor positive status [ER+]: Secondary | ICD-10-CM

## 2024-04-06 NOTE — Progress Notes (Signed)
 Referring Provider [name], MD [phone] [place],  [place] [address]   CC:  Chief Complaint  Patient presents with   Post-op Follow-up      Rebecca Dominguez is an 48 y.o. female.  HPI:  Patient is a 48 year old female with history of left breast cancer and breast asymmetry. She most recently underwent a left breast lumpectomy and left sentinel lymph node biopsy with Dr. Curvin followed by immediate left breast mastopexy and right breast reduction with Dr. Lowery on 01/31/2024.  Patient is a little over 2 months postop.  She presents to the clinic today for postoperative follow-up.     Patient was last seen in the clinic on 03/28/2024.  At this visit, patient was doing well.  On exam, breasts were soft and symmetric.  NAC's were healthy.  Wound to the left lumpectomy site appeared to have healthy appearing tissue throughout the wound.  There was a small pinpoint opening to the medial aspect of the wound.  There was no active drainage on exam.  Wound to the left inferior T-zone was approximately 2 cm x 1 cm.  There is a small pinpoint wound just medial of that wound.  There is healthy appearing tissue throughout the wound.  Wound but still present to the right inferior T-zone, did appear to be fairly similar to the previous exam.  Recommended that patient continue to clean the wounds with Vashe daily and recommended that she continue with Adaptic and [place] jelly dressing changes to the left lumpectomy site and manuka honey foam pads to the inferior T-zone's daily.  Today, patient reports she is overall doing well.  She states that she has noticed a small new wound to her right breast and feels that the left inferior T-zone wound is not improving much.  She does not report any infectious symptoms.  Patient states that she is seeing radiation oncology next week.  Review of Systems General: Does not report any infectious symptoms  Physical Exam    03/24/2024   11:46 AM  03/14/2024    8:55 AM 03/09/2024    8:35 AM  Vitals with BMI  Height   5' 5  Weight   279 lbs 6 oz  BMI   46.49  Systolic 129 121 873  Diastolic 84 78 78  Pulse 77 71 80    General:  No acute distress,  Alert and oriented, Non-Toxic, Normal speech and affect Chaperone present on exam.  On exam, patient is sitting upright in no acute distress.  Breasts are overall soft and symmetric.  There is no overlying erythema to either breast.  There are no fluid collections palpated on exam.  NAC's are healthy.  To the left breast, lumpectomy wound appears to have significantly improved from previous exam.  It appears to have filled in very nicely and has started to epithelialize as well.  To the inferior T-zone wound, there is still about a 1 cm x 0.5 cm wound with healthy appearing tissue throughout the wound.  There is a little pinpoint wound just medial to that.  To the right breast, there is a small wound to the inferior T-zone, it does appear to be improved from previous exam.  Above that, there is a small wound centrally to the vertical limb incision, it appears to be superficial.  There is no active drainage on exam.  No signs of infection.  Assessment/Plan  Malignant neoplasm of upper-inner quadrant of left breast in female, estrogen  receptor positive (HCC)  S/P bilateral breast reduction   I discussed with the patient that her wounds do appear to be improving.  Recommended that she start using collagen though to the left inferior T-zone wound to see if this might help it epithelialized.  She states that she has collagen at home.  Recommended that she apply collagen and a small amount of K-Y jelly followed by a dressing daily.  I discussed with her that if the collagen does not fully incorporated into the wound, she can just put a little bit of K-Y jelly over it and another bandage.  Recommended that she check the collagen daily.  I discussed with the patient that she can apply Vaseline to the  lumpectomy site wound as well as the wounds to her right breast.  Patient expressed understanding.  I would like the patient to follow back up in about 2 weeks.  I instructed her to call if she has any questions or concerns about anything.  Pictures were obtained of the patient and placed in the chart with the patient's or guardian's permission.   Rebecca Dominguez 04/06/2024, 5:06 PM

## 2024-04-07 ENCOUNTER — Ambulatory Visit

## 2024-04-10 ENCOUNTER — Ambulatory Visit

## 2024-04-10 ENCOUNTER — Telehealth: Payer: Self-pay | Admitting: Student

## 2024-04-10 ENCOUNTER — Ambulatory Visit: Admitting: Radiation Oncology

## 2024-04-10 NOTE — Telephone Encounter (Signed)
 Patient received order from [facility]  and said that the size of bandage was wrong, please reach out  and advise, she spoke with [facility]  and they sid they had her down as treating one incision when she has three

## 2024-04-11 ENCOUNTER — Ambulatory Visit

## 2024-04-12 ENCOUNTER — Ambulatory Visit

## 2024-04-12 NOTE — Progress Notes (Signed)
 Radiation Oncology         [phone] ________________________________  Name: Rebecca Dominguez MRN: [id number]  Date: 04/13/2024  DOB: 1976/04/12  Follow-Up Visit Note  CC: [name], MD  [name], MD  No diagnosis found.  Diagnosis: The encounter diagnosis was Malignant neoplasm of upper-inner quadrant of left breast in female, estrogen receptor positive (HCC).   Stage IA (cT1a, cN0, cM0) Left Breast UIQ, Invasive Ductal Carcinoma, ER+ / PR+ / Her2-, Grade 1  Interval Since Last Radiation:  not yet treated    Narrative:  The patient returns today for routine follow-up. She was last seen in office on 03/01/24 for a consultation visit.   In the interval since she was last seen, she presented for a follow up visit with Dr. Landy of plastic surgery on 8/14 complaining of sudden onset redness and drainage from her left lumpectomy site. She was prescribed bactrim  at that time. She returned for a follow up on 8/21 during which her wound was noted to not be completely heeled yet therefore scheduled radiation treatment was delayed.   She returned for a follow up on 9/5 and 9/9 to examine her wound for which it was noted to be healing well but slowly. She was advised to apply her manuka honey dressings over the inferior T-zone wounds.       Most recent follow up with Dr. Curvin on 9/24  = report not available                            Allergies:  has no known allergies.  Meds: Current Outpatient Medications  Medication Sig Dispense Refill   acetaminophen  (TYLENOL ) 500 MG tablet Take 500 mg by mouth every 6 (six) hours as needed.     aspirin-acetaminophen -caffeine (EXCEDRIN MIGRAINE) 250-250-65 MG tablet Take 1 tablet by mouth every 6 (six) hours as needed for headache.     cyanocobalamin  (VITAMIN B12) 500 MCG tablet Take 1 tablet (500 mcg total) by mouth daily. 30 tablet 2   escitalopram  (LEXAPRO ) 10 MG tablet Take 1 tablet (10 mg total) by mouth daily. 90 tablet 0    escitalopram  (LEXAPRO ) 20 MG tablet Take 1 tablet (20 mg total) by mouth daily. 90 tablet 0   ferrous sulfate 325 (65 FE) MG tablet Take 325 mg by mouth daily.     Glucosamine-Chondroit-Vit C-Mn (GLUCOSAMINE CHONDR 1500 COMPLX) CAPS Take 1 capsule by mouth daily.     Multiple Vitamins-Minerals (MULTIVITAMIN WOMEN) TABS Take 1 tablet by mouth daily.     naproxen (NAPROSYN) 500 MG tablet Take 500 mg by mouth 2 (two) times daily as needed.      tamoxifen  (NOLVADEX ) 20 MG tablet Take 1 tablet (20 mg total) by mouth daily. 30 tablet 2   vitamin C (ASCORBIC ACID) 500 MG tablet Take 500 mg by mouth daily.     No current facility-administered medications for this visit.    Physical Findings: The patient is in no acute distress. Patient is alert and oriented.  vitals were not taken for this visit. .  No significant changes. Lungs are clear to auscultation bilaterally. Heart has regular rate and rhythm. No palpable cervical, supraclavicular, or axillary adenopathy. Abdomen soft, non-tender, normal bowel sounds.   Lab Findings: Lab Results  Component Value Date   WBC 5.9 12/01/2023   HGB 13.8 12/01/2023   HCT 40.2 12/01/2023   MCV 90.7 12/01/2023   PLT 178 12/01/2023  Radiographic Findings: No results found.  Impression:  Stage IA (cT1a, cN0, cM0) Left Breast UIQ, Invasive Ductal Carcinoma, ER+ / PR+ / Her2-, Grade 1  The patient is recovering from the effects of radiation.  ***  Plan:  ***   *** minutes of total time was spent for this patient encounter, including preparation, face-to-face counseling with the patient and coordination of care, physical exam, and documentation of the encounter. ____________________________________  Lynwood CHARM Nasuti, PhD, MD  This document serves as a record of services personally performed by Lynwood Nasuti, MD. It was created on his behalf by Reymundo Cartwright, a trained medical scribe. The creation of this record is based on the scribe's personal observations  and the provider's statements to them. This document has been checked and approved by the attending provider.

## 2024-04-13 ENCOUNTER — Ambulatory Visit

## 2024-04-13 ENCOUNTER — Ambulatory Visit
Admission: RE | Admit: 2024-04-13 | Discharge: 2024-04-13 | Disposition: A | Discharge status: Home / Self Care | Source: Ambulatory Visit | Attending: Radiation Oncology | Admitting: Radiation Oncology

## 2024-04-13 ENCOUNTER — Encounter: Payer: Self-pay | Admitting: Radiation Oncology

## 2024-04-13 VITALS — BP 152/88 | HR 89 | Temp 97.1°F | Resp 18 | Ht 65.0 in | Wt 275.0 lb

## 2024-04-13 DIAGNOSIS — C50212 Malignant neoplasm of upper-inner quadrant of left female breast: Secondary | ICD-10-CM

## 2024-04-13 NOTE — Progress Notes (Signed)
 Rebecca Dominguez is here today for skin check to  left breast.     Does the patient complain of any of the following: skin issues:  Patient reports having open areas under breast. Lumpectomy scar has improved. Patient was seen by Dr. Curvin yesterday.  Breast Tenderness: No Breast Swelling: No Lymphadema: No Appetite good/fair/poor: good  Additional comments if applicable:   BP (!) 152/88 (BP Location: Right Arm, Patient Position: Sitting)   Pulse 89   Temp (!) 97.1 F (36.2 C) (Temporal)   Resp 18   Ht 5' 5 (1.651 m)   Wt 275 lb (124.7 kg)   SpO2 99%   BMI 45.76 kg/m

## 2024-04-14 ENCOUNTER — Other Ambulatory Visit (INDEPENDENT_AMBULATORY_CARE_PROVIDER_SITE_OTHER): Payer: Self-pay | Admitting: Family Medicine

## 2024-04-14 ENCOUNTER — Ambulatory Visit

## 2024-04-14 DIAGNOSIS — F3289 Other specified depressive episodes: Secondary | ICD-10-CM

## 2024-04-17 ENCOUNTER — Telehealth (INDEPENDENT_AMBULATORY_CARE_PROVIDER_SITE_OTHER): Admitting: Family Medicine

## 2024-04-17 ENCOUNTER — Ambulatory Visit

## 2024-04-17 ENCOUNTER — Ambulatory Visit
Admission: RE | Admit: 2024-04-17 | Discharge: 2024-04-17 | Disposition: A | Discharge status: Home / Self Care | Source: Ambulatory Visit | Attending: Radiation Oncology | Admitting: Radiation Oncology

## 2024-04-17 ENCOUNTER — Encounter (INDEPENDENT_AMBULATORY_CARE_PROVIDER_SITE_OTHER): Payer: Self-pay | Admitting: Family Medicine

## 2024-04-17 DIAGNOSIS — G479 Sleep disorder, unspecified: Secondary | ICD-10-CM

## 2024-04-17 DIAGNOSIS — C50212 Malignant neoplasm of upper-inner quadrant of left female breast: Secondary | ICD-10-CM | POA: Insufficient documentation

## 2024-04-17 DIAGNOSIS — Z170 Estrogen receptor positive status [ER+]: Secondary | ICD-10-CM | POA: Insufficient documentation

## 2024-04-17 DIAGNOSIS — F439 Reaction to severe stress, unspecified: Secondary | ICD-10-CM | POA: Diagnosis not present

## 2024-04-17 DIAGNOSIS — Z6841 Body Mass Index (BMI) 40.0 and over, adult: Secondary | ICD-10-CM

## 2024-04-17 DIAGNOSIS — F5089 Other specified eating disorder: Secondary | ICD-10-CM

## 2024-04-17 DIAGNOSIS — F3289 Other specified depressive episodes: Secondary | ICD-10-CM

## 2024-04-17 DIAGNOSIS — E669 Obesity, unspecified: Secondary | ICD-10-CM

## 2024-04-17 MED ORDER — ESCITALOPRAM OXALATE 20 MG PO TABS: 20.0000 mg | ORAL_TABLET | Freq: Every day | ORAL | 0 refills | Status: DC

## 2024-04-17 MED ORDER — ESCITALOPRAM OXALATE 10 MG PO TABS: 10.0000 mg | ORAL_TABLET | Freq: Every day | ORAL | 0 refills | Status: DC

## 2024-04-17 NOTE — Progress Notes (Signed)
 Office: [phone]  /  Fax: [phone]  WEIGHT SUMMARY AND BIOMETRICS  Anthropometric Measurements Height: 5' 5 (1.651 m) Weight: 275 lb (124.7 kg) (last ov weight) BMI (Calculated): 45.76 Starting Weight: 338 lb Total Weight Loss (lbs): 63 lb (28.6 kg) Peak Weight: 358 lb   No data recorded Other Clinical Data Fasting: no Labs: no Today's Visit #: 10 Starting Date: 09/12/18 Comments: my chart video visit    Chief Complaint: OBESITY  Virtual Visit via A/V Note  I connected with Rebecca Dominguez on 04/17/24 at  3:20 PM EDT by audiovisual telehealth and verified that I am speaking with the correct person using two identifiers.  Location: Patient: work Provider: home   I discussed the limitations, risks, security and privacy concerns of performing an evaluation and management service by AV telehealth and the availability of in person appointments. I also discussed with the patient that there may be a patient responsible charge related to this service. The patient expressed understanding and agreed to proceed.    History of Present Illness Rebecca Dominguez is a 48 year old female with obesity who presents for obesity treatment and progress assessment.  She is adhering to a category three eating plan approximately 65% of the time, focusing on increasing her intake of whole foods and meeting her protein goals. She hydrates adequately, occasionally tracks her calorie intake, and does not skip meals. She averages about 4,000 steps daily, six days a week, and reports sleeping reasonably well.  Earlier this year, she was diagnosed with breast cancer and underwent surgery. She is preparing to start radiation therapy, which was delayed due to complications with her lumpectomy incision opening up because of a seroma. This complication has caused frustration as it delayed her radiation therapy and led to the cancellation of a planned trip to [place] . She has been attending  almost weekly visits with the plastic surgery office for wound care management.  She is currently on Lexapro , which was increased to 30 mg last month for depression management. Emotional distress is present due to the cancellation of her vacation and other personal stressors, including her husband's job loss and the denial of his disability application.  She experiences interrupted sleep, waking up in the middle of the night and sometimes taking naps on weekends to compensate. She is focusing on increasing her protein intake as advised, and has found a high-protein snack she enjoys, which are roasted edamame beans with 11 grams of protein per serving.      PHYSICAL EXAM:  Height 5' 5 (1.651 m), weight 275 lb (124.7 kg). Body mass index is 45.76 kg/m.  DIAGNOSTIC DATA REVIEWED:  BMET    Component Value Date/Time   NA 140 12/01/2023 1221   NA 141 03/31/2023 0829   K 4.7 12/01/2023 1221   CL 109 12/01/2023 1221   CO2 28 12/01/2023 1221   GLUCOSE 86 12/01/2023 1221   BUN 23 (H) 12/01/2023 1221   BUN 15 03/31/2023 0829   CREATININE 0.61 12/01/2023 1221   CALCIUM 9.5 12/01/2023 1221   GFRNONAA >60 12/01/2023 1221   GFRAA 115 12/27/2019 1150   Lab Results  Component Value Date   HGBA1C 5.3 03/31/2023   HGBA1C 5.3 09/12/2018   Lab Results  Component Value Date   INSULIN  5.5 03/31/2023   INSULIN  8.8 09/12/2018   Lab Results  Component Value Date   TSH 2.210 11/19/2022   CBC    Component Value Date/Time   WBC 5.9 12/01/2023 1221  RBC 4.43 12/01/2023 1221   HGB 13.8 12/01/2023 1221   HGB 13.5 11/19/2022 0842   HCT 40.2 12/01/2023 1221   HCT 42.7 11/19/2022 0842   PLT 178 12/01/2023 1221   PLT 204 11/19/2022 0842   MCV 90.7 12/01/2023 1221   MCV 94 11/19/2022 0842   MCH 31.2 12/01/2023 1221   MCHC 34.3 12/01/2023 1221   RDW 12.9 12/01/2023 1221   RDW 13.3 11/19/2022 0842   Iron Studies    Component Value Date/Time   IRON 54 06/05/2020 0000   TIBC 441  06/05/2020 0000   FERRITIN 17 09/12/2018 0957   IRONPCTSAT 12 06/05/2020 0000   Lipid Panel     Component Value Date/Time   CHOL 155 11/19/2022 0842   TRIG 36 11/19/2022 0842   HDL 73 11/19/2022 0842   CHOLHDL 2.3 01/28/2021 1115   LDLCALC 73 11/19/2022 0842   Hepatic Function Panel     Component Value Date/Time   PROT 6.6 12/01/2023 1221   PROT 6.0 03/31/2023 0829   ALBUMIN 4.0 12/01/2023 1221   ALBUMIN 3.9 03/31/2023 0829   AST 25 12/01/2023 1221   ALT 28 12/01/2023 1221   ALKPHOS 58 12/01/2023 1221   BILITOT 0.4 12/01/2023 1221      Component Value Date/Time   TSH 2.210 11/19/2022 0842   Nutritional Lab Results  Component Value Date   VD25OH 53.4 03/31/2023   VD25OH 36.3 11/19/2022   VD25OH 41.1 05/27/2022     Assessment and Plan Assessment & Plan Obesity management Following the category three eating plan about 65% of the time. Averaging 4,000 steps daily, six days a week. Working on eating more whole foods and meeting protein goals. Hydrating adequately and occasionally tracking calories without skipping meals. Making efforts to improve diet and physical activity, essential components of obesity management. - Continue category three eating plan - Encourage increasing daily steps - Continue focusing on whole foods and protein intake - Maintain adequate hydration - Encourage consistent calorie tracking  Major depressive disorder with EEB and increased stress Experiencing significant emotional distress due to recent life events, including breast cancer diagnosis and treatment complications, leading to the cancellation of a planned vacation. Currently on Lexapro  30 mg, increased last month. Advised against increasing dose to 40 mg due to potential side effects outweighing benefits. Advised to maintain current dose and reassured about situational nature of distress. - Continue Lexapro  30 mg daily - Provide a 90-day refill for Lexapro  - Monitor emotional well-being  and reassess if needed  Sleep disturbance Reports interrupted sleep, waking up multiple times during the night. Getting approximately eight hours of sleep, but often interrupted. Considering strategies to improve sleep quality. - Try Calm gummies (melatonin and magnesium) for sleep improvement - Monitor sleep quality and adjust strategies as needed      Ranelle was informed of the importance of frequent follow up visits to maximize her success with intensive lifestyle modifications for her obesity and obesity related health conditions as recommended by USPSTF and CMS guidelines   Louann Penton, MD

## 2024-04-18 ENCOUNTER — Telehealth: Payer: Self-pay | Admitting: *Deleted

## 2024-04-18 ENCOUNTER — Ambulatory Visit

## 2024-04-18 NOTE — Telephone Encounter (Signed)
 Faxed new order,demographics, and insurance information to [facility]  for new supplies for the patient.  Confirmation received and copy scanned into the chart.//AR/CMA

## 2024-04-19 ENCOUNTER — Encounter: Payer: Self-pay | Admitting: Student

## 2024-04-19 ENCOUNTER — Ambulatory Visit

## 2024-04-19 ENCOUNTER — Ambulatory Visit (INDEPENDENT_AMBULATORY_CARE_PROVIDER_SITE_OTHER): Admitting: Student

## 2024-04-19 VITALS — BP 102/67 | HR 71 | Ht 65.0 in | Wt 273.0 lb

## 2024-04-19 DIAGNOSIS — Z9889 Other specified postprocedural states: Secondary | ICD-10-CM

## 2024-04-19 NOTE — Progress Notes (Signed)
 Referring Provider [name], MD [phone] [address] [place],  [place] [address]   CC:  Chief Complaint  Patient presents with   Post-op Follow-up      Rebecca Dominguez is an 48 y.o. female.  HPI: Patient is a 48 year old female with history of left breast cancer and breast asymmetry. She most recently underwent a left breast lumpectomy and left sentinel lymph node biopsy with Dr. Curvin followed by immediate left breast mastopexy and right breast reduction with Dr. Lowery on 01/31/2024.  Patient is about 2-1/2 months postop.  She presents to the clinic today for  postoperative follow-up.  Patient was last seen in the clinic on 04/06/2024.  At this visit, patient was overall doing well.  On exam, breasts were soft and symmetric.  NAC's were healthy.  To the left breast, lumpectomy wound appears to have significantly improved and had filled in very nicely and was also starting to epithelialize.  The inferior T-zone wound there is still about a 1 cm x 0.5 cm wound with healthy appearing tissue throughout the wound.  There was a small pinpoint wound just medial of that.  To the right breast, there was a small wound to the inferior T-zone which appear to be much improved.  Recommended that she apply collagen to the left inferior T-zone wound and that she can apply Vaseline to the right breast wounds and to the lumpectomy site.  Today, patient reports she is doing okay.  She states that she saw a radiation oncology last week and that the plan is to start radiation on Monday.  Patient also states that since her last appointment, a clip from the lumpectomy site came out through her incision.  She states that she did notify Dr. Curvin of this.  She denies any other issues or concerns at this time.  She does not report any fevers or chills.  Review of Systems General: Does not report any fevers or chills  Physical Exam    04/19/2024   10:31 AM 04/17/2024   10:00 AM 04/13/2024    8:19 AM   Vitals with BMI  Height 5' 5 5' 5 5' 5  Weight 273 lbs 275 lbs 275 lbs  BMI 45.43 45.76 45.76  Systolic 102  152  Diastolic 67  88  Pulse 71  89    General:  No acute distress,  Alert and oriented, Non-Toxic, Normal speech and affect Chaperone present on exam.  On exam, patient is sitting upright in no acute distress.  Breasts are overall soft and symmetric.  There are no obvious fluid collections on exam.  To the left breast lumpectomy site, it overall appears to be almost completely healed.  There is a pinpoint wound noted.  No active drainage on exam.  Wounds to the left inferior T-zone appear to have significantly improved and almost have completely epithelialized.  Wound to the right inferior T-zone has completely healed.  To the right vertical limb incision, there is a small wound with some healthy appearing tissue.  There are no signs of infection on exam.  Assessment/Plan  S/P bilateral breast reduction   I discussed with the patient that overall her wounds to her left breast appear to be healing up.  Recommended that she apply Vaseline to both of the wounds.  We did discuss the possibility of wound healing issues with her beginning radiation and she expressed understanding.  I discussed with the patient I like her to apply Medihoney dressing that  she has to the wound to her right vertical limb incision.  I discussed with her that when it gets a little bit better of a granular base, she can go ahead and start the collagen to that side.  Patient expressed understanding.  I would like the patient to follow back up in about 2 to 3 weeks  I instructed her to call if she has any questions in the meantime.  Pictures were obtained of the patient and placed in the chart with the patient's or guardian's permission.   Rebecca Dominguez 04/19/2024, 11:37 AM

## 2024-04-20 ENCOUNTER — Ambulatory Visit
Admission: RE | Admit: 2024-04-20 | Discharge: 2024-04-20 | Disposition: A | Discharge status: Home / Self Care | Source: Ambulatory Visit | Attending: Radiation Oncology | Admitting: Radiation Oncology

## 2024-04-20 ENCOUNTER — Ambulatory Visit

## 2024-04-20 DIAGNOSIS — C50212 Malignant neoplasm of upper-inner quadrant of left female breast: Secondary | ICD-10-CM | POA: Insufficient documentation

## 2024-04-20 DIAGNOSIS — Z170 Estrogen receptor positive status [ER+]: Secondary | ICD-10-CM | POA: Insufficient documentation

## 2024-04-21 ENCOUNTER — Ambulatory Visit

## 2024-04-24 ENCOUNTER — Ambulatory Visit

## 2024-04-24 ENCOUNTER — Other Ambulatory Visit: Payer: Self-pay

## 2024-04-24 ENCOUNTER — Ambulatory Visit: Admitting: Radiation Oncology

## 2024-04-24 DIAGNOSIS — C50212 Malignant neoplasm of upper-inner quadrant of left female breast: Secondary | ICD-10-CM

## 2024-04-24 LAB — RAD ONC ARIA SESSION SUMMARY
Course Elapsed Days: 0
Plan Fractions Treated to Date: 1
Plan Prescribed Dose Per Fraction: 2.67 Gy
Plan Total Fractions Prescribed: 15
Plan Total Prescribed Dose: 40.05 Gy
Reference Point Dosage Given to Date: 2.67 Gy
Reference Point Session Dosage Given: 2.67 Gy
Session Number: 1

## 2024-04-25 ENCOUNTER — Ambulatory Visit: Admitting: Radiation Oncology

## 2024-04-25 ENCOUNTER — Other Ambulatory Visit: Payer: Self-pay

## 2024-04-25 ENCOUNTER — Ambulatory Visit
Admission: RE | Admit: 2024-04-25 | Discharge: 2024-04-25 | Disposition: A | Discharge status: Home / Self Care | Source: Ambulatory Visit | Attending: Radiation Oncology | Admitting: Radiation Oncology

## 2024-04-25 ENCOUNTER — Ambulatory Visit
Admission: RE | Admit: 2024-04-25 | Discharge: 2024-04-25 | Disposition: A | Source: Ambulatory Visit | Attending: Radiation Oncology | Admitting: Radiation Oncology

## 2024-04-25 ENCOUNTER — Ambulatory Visit

## 2024-04-25 ENCOUNTER — Ambulatory Visit (INDEPENDENT_AMBULATORY_CARE_PROVIDER_SITE_OTHER): Admitting: Family Medicine

## 2024-04-25 DIAGNOSIS — C50212 Malignant neoplasm of upper-inner quadrant of left female breast: Secondary | ICD-10-CM | POA: Diagnosis not present

## 2024-04-25 LAB — RAD ONC ARIA SESSION SUMMARY
Course Elapsed Days: 1
Plan Fractions Treated to Date: 2
Plan Prescribed Dose Per Fraction: 2.67 Gy
Plan Total Fractions Prescribed: 15
Plan Total Prescribed Dose: 40.05 Gy
Reference Point Dosage Given to Date: 5.34 Gy
Reference Point Session Dosage Given: 2.67 Gy
Session Number: 2

## 2024-04-25 MED ORDER — ALRA NON-METALLIC DEODORANT (RAD-ONC)
1.0000 | Freq: Once | TOPICAL | Status: AC
  Administered 2024-04-25: 1 via TOPICAL

## 2024-04-25 MED ORDER — RADIAPLEXRX EX GEL: Freq: Once | CUTANEOUS | Status: AC

## 2024-04-26 ENCOUNTER — Ambulatory Visit

## 2024-04-26 ENCOUNTER — Ambulatory Visit
Admission: RE | Admit: 2024-04-26 | Discharge: 2024-04-26 | Attending: Radiation Oncology | Admitting: Radiation Oncology

## 2024-04-26 ENCOUNTER — Other Ambulatory Visit: Payer: Self-pay

## 2024-04-26 DIAGNOSIS — C50212 Malignant neoplasm of upper-inner quadrant of left female breast: Secondary | ICD-10-CM | POA: Diagnosis not present

## 2024-04-26 LAB — RAD ONC ARIA SESSION SUMMARY
Course Elapsed Days: 2
Plan Fractions Treated to Date: 3
Plan Prescribed Dose Per Fraction: 2.67 Gy
Plan Total Fractions Prescribed: 15
Plan Total Prescribed Dose: 40.05 Gy
Reference Point Dosage Given to Date: 8.01 Gy
Reference Point Session Dosage Given: 2.67 Gy
Session Number: 3

## 2024-04-27 ENCOUNTER — Ambulatory Visit

## 2024-04-27 ENCOUNTER — Ambulatory Visit
Admission: RE | Admit: 2024-04-27 | Discharge: 2024-04-27 | Disposition: A | Discharge status: Home / Self Care | Source: Ambulatory Visit | Attending: Radiation Oncology | Admitting: Radiation Oncology

## 2024-04-27 ENCOUNTER — Other Ambulatory Visit: Payer: Self-pay

## 2024-04-27 DIAGNOSIS — C50212 Malignant neoplasm of upper-inner quadrant of left female breast: Secondary | ICD-10-CM | POA: Diagnosis not present

## 2024-04-27 LAB — RAD ONC ARIA SESSION SUMMARY
Course Elapsed Days: 3
Plan Fractions Treated to Date: 4
Plan Prescribed Dose Per Fraction: 2.67 Gy
Plan Total Fractions Prescribed: 15
Plan Total Prescribed Dose: 40.05 Gy
Reference Point Dosage Given to Date: 10.68 Gy
Reference Point Session Dosage Given: 2.67 Gy
Session Number: 4

## 2024-04-28 ENCOUNTER — Ambulatory Visit
Admission: RE | Admit: 2024-04-28 | Discharge: 2024-04-28 | Disposition: A | Discharge status: Home / Self Care | Source: Ambulatory Visit | Attending: Radiation Oncology | Admitting: Radiation Oncology

## 2024-04-28 ENCOUNTER — Other Ambulatory Visit: Payer: Self-pay

## 2024-04-28 ENCOUNTER — Ambulatory Visit

## 2024-04-28 DIAGNOSIS — C50212 Malignant neoplasm of upper-inner quadrant of left female breast: Secondary | ICD-10-CM | POA: Diagnosis not present

## 2024-04-28 LAB — RAD ONC ARIA SESSION SUMMARY
Course Elapsed Days: 4
Plan Fractions Treated to Date: 5
Plan Prescribed Dose Per Fraction: 2.67 Gy
Plan Total Fractions Prescribed: 15
Plan Total Prescribed Dose: 40.05 Gy
Reference Point Dosage Given to Date: 13.35 Gy
Reference Point Session Dosage Given: 2.67 Gy
Session Number: 5

## 2024-05-01 ENCOUNTER — Ambulatory Visit
Admission: RE | Admit: 2024-05-01 | Discharge: 2024-05-01 | Disposition: A | Source: Ambulatory Visit | Attending: Radiation Oncology | Admitting: Radiation Oncology

## 2024-05-01 ENCOUNTER — Ambulatory Visit

## 2024-05-01 ENCOUNTER — Other Ambulatory Visit: Payer: Self-pay

## 2024-05-01 DIAGNOSIS — C50212 Malignant neoplasm of upper-inner quadrant of left female breast: Secondary | ICD-10-CM | POA: Diagnosis not present

## 2024-05-01 LAB — RAD ONC ARIA SESSION SUMMARY
Course Elapsed Days: 7
Plan Fractions Treated to Date: 6
Plan Prescribed Dose Per Fraction: 2.67 Gy
Plan Total Fractions Prescribed: 15
Plan Total Prescribed Dose: 40.05 Gy
Reference Point Dosage Given to Date: 16.02 Gy
Reference Point Session Dosage Given: 2.67 Gy
Session Number: 6

## 2024-05-02 ENCOUNTER — Ambulatory Visit
Admission: RE | Admit: 2024-05-02 | Discharge: 2024-05-02 | Disposition: A | Source: Ambulatory Visit | Attending: Radiation Oncology | Admitting: Radiation Oncology

## 2024-05-02 ENCOUNTER — Telehealth: Payer: Self-pay

## 2024-05-02 ENCOUNTER — Other Ambulatory Visit: Payer: Self-pay

## 2024-05-02 ENCOUNTER — Ambulatory Visit

## 2024-05-02 DIAGNOSIS — C50212 Malignant neoplasm of upper-inner quadrant of left female breast: Secondary | ICD-10-CM | POA: Diagnosis not present

## 2024-05-02 LAB — RAD ONC ARIA SESSION SUMMARY
Course Elapsed Days: 8
Plan Fractions Treated to Date: 7
Plan Prescribed Dose Per Fraction: 2.67 Gy
Plan Total Fractions Prescribed: 15
Plan Total Prescribed Dose: 40.05 Gy
Reference Point Dosage Given to Date: 18.69 Gy
Reference Point Session Dosage Given: 2.67 Gy
Session Number: 7

## 2024-05-02 NOTE — Telephone Encounter (Signed)
 LVM for pt regarding her FMLA form being completed,faxed, and confirmation received. Pt copy will be mailed to her address as requested.

## 2024-05-03 ENCOUNTER — Encounter: Payer: Self-pay | Admitting: Student

## 2024-05-03 ENCOUNTER — Other Ambulatory Visit: Payer: Self-pay

## 2024-05-03 ENCOUNTER — Ambulatory Visit (INDEPENDENT_AMBULATORY_CARE_PROVIDER_SITE_OTHER): Admitting: Student

## 2024-05-03 ENCOUNTER — Ambulatory Visit

## 2024-05-03 ENCOUNTER — Ambulatory Visit
Admission: RE | Admit: 2024-05-03 | Discharge: 2024-05-03 | Disposition: A | Discharge status: Home / Self Care | Source: Ambulatory Visit | Attending: Radiation Oncology | Admitting: Radiation Oncology

## 2024-05-03 VITALS — BP 110/70 | HR 85 | Ht 65.0 in | Wt 273.0 lb

## 2024-05-03 DIAGNOSIS — Z170 Estrogen receptor positive status [ER+]: Secondary | ICD-10-CM

## 2024-05-03 DIAGNOSIS — C50212 Malignant neoplasm of upper-inner quadrant of left female breast: Secondary | ICD-10-CM | POA: Diagnosis not present

## 2024-05-03 DIAGNOSIS — Z9889 Other specified postprocedural states: Secondary | ICD-10-CM

## 2024-05-03 LAB — RAD ONC ARIA SESSION SUMMARY
Course Elapsed Days: 9
Plan Fractions Treated to Date: 8
Plan Prescribed Dose Per Fraction: 2.67 Gy
Plan Total Fractions Prescribed: 15
Plan Total Prescribed Dose: 40.05 Gy
Reference Point Dosage Given to Date: 21.36 Gy
Reference Point Session Dosage Given: 2.67 Gy
Session Number: 8

## 2024-05-03 NOTE — Progress Notes (Signed)
 Patient is a 48 year old female with history of left breast cancer and breast asymmetry. She most recently underwent a left breast lumpectomy and left sentinel lymph node biopsy with Dr. Curvin followed by immediate left breast mastopexy and right breast reduction with Dr. Lowery on 01/31/2024.  Patient is about 3 months postop.  She presents to the clinic today for  postoperative follow-up.   Patient was last seen in the clinic on 04/19/2024.  At this visit, patient was doing okay.  She reported that she was to start radiation the following Monday.  On exam, to the left breast lumpectomy site, it overall appeared to be almost completely healed but there was a pinpoint wound noted.  Wound to the left inferior T-zone appear to have significantly improved and almost have completely epithelialized.  The wound to the right inferior T-zone had completely healed.  The wound to the right vertical limb incision was noted to have healthy appearing tissue.  Recommended Vaseline to the wounds of her left breast and Medihoney dressing to the right vertical limb incision.  Today, patient reports she is overall doing well.  She states that she feels her wounds have been healing.  She states that she started radiation and feels that things are going pretty well.  Patient does report that there is a suture still present on the left inframammary incision that is right underneath the skin that is not dissolving.  She states that she has been massaging this.  She states that it bothers her greatly.  She states that she brought it up to her radiation oncologist and asked if she were to have it excised if it would be okay, and patient reported that the radiation oncologist stated that it would be fine if a small excision or to be performed.  Patient also states that she developed a pinpoint wound to the right superior vertical limb incision.  She states that there has been a little bit of drainage on dressings, but no copious amount  of drainage.  Patient denies any fevers or chills.  She denies any other issues or concerns at this time.  Chaperone present on exam.  On exam, patient is sitting upright in no acute distress.  Breasts are soft and symmetric.  There is no overlying erythema.  No obvious fluid collection or fluid wave is palpated on exam.  NAC's appear to be healthy.  To the right breast, there is a small pinpoint wound noted at the junction of the NAC and the vertical limb incision.  There is very minimal serous drainage upon deep palpation.  There is no surrounding erythema.  Remainder of the wounds to the right vertical limb incision appear to be well-healed, significantly improved from previous exam.  Wounds to the left breast appear to be completely healed as well.  There is a small bump to the left lateral incision, consistent with a suture underneath the skin.  There is no erythema or drainage.  No signs of infection on exam.  I discussed with the patient that I will talk with Dr. Lowery about possibility of excision of the suture to her left lateral incision.  In the meantime, recommended she continue to massage at.  Also recommended she cover the area with a Mepilex border dressing.  Patient expressed understanding.  Recommended that patient continue with Vaseline throughout her wounds.  Discussed with her to keep a close eye on the pinpoint wound to her right breast.  Discussed with her that if it begins to  drain more she may start calcium alginate, but I would like her to let us  know if it does begin to drain more.  Patient expressed understanding.  Patient to follow back up in 3 weeks.  I instructed her to call if she has any questions or concerns about anything.  Pictures were obtained of the patient and placed in the chart with the patient's or guardian's permission.

## 2024-05-04 ENCOUNTER — Other Ambulatory Visit: Payer: Self-pay

## 2024-05-04 ENCOUNTER — Ambulatory Visit

## 2024-05-04 ENCOUNTER — Ambulatory Visit
Admission: RE | Admit: 2024-05-04 | Discharge: 2024-05-04 | Disposition: A | Discharge status: Home / Self Care | Source: Ambulatory Visit | Attending: Radiation Oncology | Admitting: Radiation Oncology

## 2024-05-04 DIAGNOSIS — C50212 Malignant neoplasm of upper-inner quadrant of left female breast: Secondary | ICD-10-CM | POA: Diagnosis not present

## 2024-05-04 LAB — RAD ONC ARIA SESSION SUMMARY
Course Elapsed Days: 10
Plan Fractions Treated to Date: 9
Plan Prescribed Dose Per Fraction: 2.67 Gy
Plan Total Fractions Prescribed: 15
Plan Total Prescribed Dose: 40.05 Gy
Reference Point Dosage Given to Date: 24.03 Gy
Reference Point Session Dosage Given: 2.67 Gy
Session Number: 9

## 2024-05-05 ENCOUNTER — Ambulatory Visit
Admission: RE | Admit: 2024-05-05 | Discharge: 2024-05-05 | Disposition: A | Discharge status: Home / Self Care | Source: Ambulatory Visit | Attending: Radiation Oncology | Admitting: Radiation Oncology

## 2024-05-05 ENCOUNTER — Ambulatory Visit

## 2024-05-05 ENCOUNTER — Other Ambulatory Visit: Payer: Self-pay | Admitting: Student

## 2024-05-05 ENCOUNTER — Other Ambulatory Visit: Payer: Self-pay

## 2024-05-05 DIAGNOSIS — C50212 Malignant neoplasm of upper-inner quadrant of left female breast: Secondary | ICD-10-CM | POA: Diagnosis not present

## 2024-05-05 DIAGNOSIS — Z9889 Other specified postprocedural states: Secondary | ICD-10-CM

## 2024-05-05 LAB — RAD ONC ARIA SESSION SUMMARY
Course Elapsed Days: 11
Plan Fractions Treated to Date: 10
Plan Prescribed Dose Per Fraction: 2.67 Gy
Plan Total Fractions Prescribed: 15
Plan Total Prescribed Dose: 40.05 Gy
Reference Point Dosage Given to Date: 26.7 Gy
Reference Point Session Dosage Given: 2.67 Gy
Session Number: 10

## 2024-05-05 NOTE — Progress Notes (Signed)
 Orders for procedure to excise stitch to the left inframammary incision

## 2024-05-08 ENCOUNTER — Ambulatory Visit

## 2024-05-08 ENCOUNTER — Ambulatory Visit
Admission: RE | Admit: 2024-05-08 | Discharge: 2024-05-08 | Disposition: A | Discharge status: Home / Self Care | Source: Ambulatory Visit | Attending: Radiation Oncology | Admitting: Radiation Oncology

## 2024-05-08 ENCOUNTER — Other Ambulatory Visit: Payer: Self-pay

## 2024-05-08 DIAGNOSIS — C50212 Malignant neoplasm of upper-inner quadrant of left female breast: Secondary | ICD-10-CM | POA: Diagnosis not present

## 2024-05-08 LAB — RAD ONC ARIA SESSION SUMMARY
Course Elapsed Days: 14
Plan Fractions Treated to Date: 11
Plan Prescribed Dose Per Fraction: 2.67 Gy
Plan Total Fractions Prescribed: 15
Plan Total Prescribed Dose: 40.05 Gy
Reference Point Dosage Given to Date: 29.37 Gy
Reference Point Session Dosage Given: 2.67 Gy
Session Number: 11

## 2024-05-09 ENCOUNTER — Ambulatory Visit
Admission: RE | Admit: 2024-05-09 | Discharge: 2024-05-09 | Disposition: A | Discharge status: Home / Self Care | Source: Ambulatory Visit | Attending: Radiation Oncology | Admitting: Radiation Oncology

## 2024-05-09 ENCOUNTER — Ambulatory Visit: Admitting: Radiation Oncology

## 2024-05-09 ENCOUNTER — Other Ambulatory Visit: Payer: Self-pay

## 2024-05-09 ENCOUNTER — Ambulatory Visit
Admission: RE | Admit: 2024-05-09 | Discharge: 2024-05-09 | Disposition: A | Source: Ambulatory Visit | Attending: Radiation Oncology | Admitting: Radiation Oncology

## 2024-05-09 DIAGNOSIS — C50212 Malignant neoplasm of upper-inner quadrant of left female breast: Secondary | ICD-10-CM | POA: Diagnosis not present

## 2024-05-09 LAB — RAD ONC ARIA SESSION SUMMARY
Course Elapsed Days: 15
Plan Fractions Treated to Date: 12
Plan Prescribed Dose Per Fraction: 2.67 Gy
Plan Total Fractions Prescribed: 15
Plan Total Prescribed Dose: 40.05 Gy
Reference Point Dosage Given to Date: 32.04 Gy
Reference Point Session Dosage Given: 2.67 Gy
Session Number: 12

## 2024-05-10 ENCOUNTER — Ambulatory Visit
Admission: RE | Admit: 2024-05-10 | Discharge: 2024-05-10 | Disposition: A | Discharge status: Home / Self Care | Source: Ambulatory Visit | Attending: Radiation Oncology | Admitting: Radiation Oncology

## 2024-05-10 ENCOUNTER — Other Ambulatory Visit: Payer: Self-pay

## 2024-05-10 DIAGNOSIS — C50212 Malignant neoplasm of upper-inner quadrant of left female breast: Secondary | ICD-10-CM | POA: Diagnosis not present

## 2024-05-10 LAB — RAD ONC ARIA SESSION SUMMARY
Course Elapsed Days: 16
Plan Fractions Treated to Date: 13
Plan Prescribed Dose Per Fraction: 2.67 Gy
Plan Total Fractions Prescribed: 15
Plan Total Prescribed Dose: 40.05 Gy
Reference Point Dosage Given to Date: 34.71 Gy
Reference Point Session Dosage Given: 2.67 Gy
Session Number: 13

## 2024-05-11 ENCOUNTER — Other Ambulatory Visit: Payer: Self-pay

## 2024-05-11 ENCOUNTER — Ambulatory Visit
Admission: RE | Admit: 2024-05-11 | Discharge: 2024-05-11 | Disposition: A | Discharge status: Home / Self Care | Source: Ambulatory Visit | Attending: Radiation Oncology | Admitting: Radiation Oncology

## 2024-05-11 DIAGNOSIS — C50212 Malignant neoplasm of upper-inner quadrant of left female breast: Secondary | ICD-10-CM | POA: Diagnosis not present

## 2024-05-11 LAB — RAD ONC ARIA SESSION SUMMARY
Course Elapsed Days: 17
Plan Fractions Treated to Date: 14
Plan Prescribed Dose Per Fraction: 2.67 Gy
Plan Total Fractions Prescribed: 15
Plan Total Prescribed Dose: 40.05 Gy
Reference Point Dosage Given to Date: 37.38 Gy
Reference Point Session Dosage Given: 2.67 Gy
Session Number: 14

## 2024-05-12 ENCOUNTER — Ambulatory Visit
Admission: RE | Admit: 2024-05-12 | Discharge: 2024-05-12 | Disposition: A | Discharge status: Home / Self Care | Source: Ambulatory Visit | Attending: Radiation Oncology | Admitting: Radiation Oncology

## 2024-05-12 ENCOUNTER — Other Ambulatory Visit: Payer: Self-pay

## 2024-05-12 DIAGNOSIS — C50212 Malignant neoplasm of upper-inner quadrant of left female breast: Secondary | ICD-10-CM | POA: Diagnosis not present

## 2024-05-12 LAB — RAD ONC ARIA SESSION SUMMARY
Course Elapsed Days: 18
Plan Fractions Treated to Date: 15
Plan Prescribed Dose Per Fraction: 2.67 Gy
Plan Total Fractions Prescribed: 15
Plan Total Prescribed Dose: 40.05 Gy
Reference Point Dosage Given to Date: 40.05 Gy
Reference Point Session Dosage Given: 2.67 Gy
Session Number: 15

## 2024-05-15 ENCOUNTER — Ambulatory Visit
Admission: RE | Admit: 2024-05-15 | Discharge: 2024-05-15 | Disposition: A | Source: Ambulatory Visit | Attending: Radiation Oncology | Admitting: Radiation Oncology

## 2024-05-15 ENCOUNTER — Other Ambulatory Visit: Payer: Self-pay

## 2024-05-15 DIAGNOSIS — C50212 Malignant neoplasm of upper-inner quadrant of left female breast: Secondary | ICD-10-CM | POA: Diagnosis not present

## 2024-05-15 LAB — RAD ONC ARIA SESSION SUMMARY
Course Elapsed Days: 21
Plan Fractions Treated to Date: 1
Plan Prescribed Dose Per Fraction: 2 Gy
Plan Total Fractions Prescribed: 6
Plan Total Prescribed Dose: 12 Gy
Reference Point Dosage Given to Date: 2 Gy
Reference Point Session Dosage Given: 2 Gy
Session Number: 16

## 2024-05-16 ENCOUNTER — Other Ambulatory Visit: Payer: Self-pay

## 2024-05-16 ENCOUNTER — Ambulatory Visit
Admission: RE | Admit: 2024-05-16 | Discharge: 2024-05-16 | Disposition: A | Discharge status: Home / Self Care | Source: Ambulatory Visit | Attending: Radiation Oncology | Admitting: Radiation Oncology

## 2024-05-16 DIAGNOSIS — C50212 Malignant neoplasm of upper-inner quadrant of left female breast: Secondary | ICD-10-CM | POA: Diagnosis not present

## 2024-05-16 LAB — RAD ONC ARIA SESSION SUMMARY
Course Elapsed Days: 22
Plan Fractions Treated to Date: 2
Plan Prescribed Dose Per Fraction: 2 Gy
Plan Total Fractions Prescribed: 6
Plan Total Prescribed Dose: 12 Gy
Reference Point Dosage Given to Date: 4 Gy
Reference Point Session Dosage Given: 2 Gy
Session Number: 17

## 2024-05-17 ENCOUNTER — Other Ambulatory Visit: Payer: Self-pay

## 2024-05-17 ENCOUNTER — Ambulatory Visit
Admission: RE | Admit: 2024-05-17 | Discharge: 2024-05-17 | Disposition: A | Source: Ambulatory Visit | Attending: Radiation Oncology | Admitting: Radiation Oncology

## 2024-05-17 DIAGNOSIS — C50212 Malignant neoplasm of upper-inner quadrant of left female breast: Secondary | ICD-10-CM | POA: Diagnosis not present

## 2024-05-17 LAB — RAD ONC ARIA SESSION SUMMARY
Course Elapsed Days: 23
Plan Fractions Treated to Date: 3
Plan Prescribed Dose Per Fraction: 2 Gy
Plan Total Fractions Prescribed: 6
Plan Total Prescribed Dose: 12 Gy
Reference Point Dosage Given to Date: 6 Gy
Reference Point Session Dosage Given: 2 Gy
Session Number: 18

## 2024-05-18 ENCOUNTER — Other Ambulatory Visit: Payer: Self-pay

## 2024-05-18 ENCOUNTER — Ambulatory Visit
Admission: RE | Admit: 2024-05-18 | Discharge: 2024-05-18 | Disposition: A | Discharge status: Home / Self Care | Source: Ambulatory Visit | Attending: Radiation Oncology | Admitting: Radiation Oncology

## 2024-05-18 DIAGNOSIS — C50212 Malignant neoplasm of upper-inner quadrant of left female breast: Secondary | ICD-10-CM | POA: Diagnosis not present

## 2024-05-18 LAB — RAD ONC ARIA SESSION SUMMARY
Course Elapsed Days: 24
Plan Fractions Treated to Date: 4
Plan Prescribed Dose Per Fraction: 2 Gy
Plan Total Fractions Prescribed: 6
Plan Total Prescribed Dose: 12 Gy
Reference Point Dosage Given to Date: 8 Gy
Reference Point Session Dosage Given: 2 Gy
Session Number: 19

## 2024-05-19 ENCOUNTER — Other Ambulatory Visit: Payer: Self-pay

## 2024-05-19 ENCOUNTER — Ambulatory Visit
Admission: RE | Admit: 2024-05-19 | Discharge: 2024-05-19 | Disposition: A | Discharge status: Home / Self Care | Source: Ambulatory Visit | Attending: Radiation Oncology | Admitting: Radiation Oncology

## 2024-05-19 DIAGNOSIS — C50212 Malignant neoplasm of upper-inner quadrant of left female breast: Secondary | ICD-10-CM | POA: Diagnosis not present

## 2024-05-19 LAB — RAD ONC ARIA SESSION SUMMARY
Course Elapsed Days: 25
Plan Fractions Treated to Date: 5
Plan Prescribed Dose Per Fraction: 2 Gy
Plan Total Fractions Prescribed: 6
Plan Total Prescribed Dose: 12 Gy
Reference Point Dosage Given to Date: 10 Gy
Reference Point Session Dosage Given: 2 Gy
Session Number: 20

## 2024-05-22 ENCOUNTER — Ambulatory Visit
Admission: RE | Admit: 2024-05-22 | Discharge: 2024-05-22 | Disposition: A | Discharge status: Home / Self Care | Source: Ambulatory Visit | Attending: Radiation Oncology | Admitting: Radiation Oncology

## 2024-05-22 ENCOUNTER — Other Ambulatory Visit: Payer: Self-pay

## 2024-05-22 DIAGNOSIS — C50212 Malignant neoplasm of upper-inner quadrant of left female breast: Secondary | ICD-10-CM | POA: Insufficient documentation

## 2024-05-22 DIAGNOSIS — Z510 Encounter for antineoplastic radiation therapy: Secondary | ICD-10-CM | POA: Diagnosis present

## 2024-05-22 DIAGNOSIS — Z170 Estrogen receptor positive status [ER+]: Secondary | ICD-10-CM | POA: Insufficient documentation

## 2024-05-22 LAB — RAD ONC ARIA SESSION SUMMARY
Course Elapsed Days: 28
Plan Fractions Treated to Date: 6
Plan Prescribed Dose Per Fraction: 2 Gy
Plan Total Fractions Prescribed: 6
Plan Total Prescribed Dose: 12 Gy
Reference Point Dosage Given to Date: 12 Gy
Reference Point Session Dosage Given: 2 Gy
Session Number: 21

## 2024-05-23 NOTE — Radiation Completion Notes (Signed)
  Radiation Oncology         [phone] ________________________________  Name: Rebecca Dominguez MRN: [id number]  Date of Service: 05/22/2024  DOB: 20-Apr-1976  End of Treatment Note   Diagnosis: Stage IA (cT1a, cN0, cM0) Left Breast UIQ, Invasive Ductal Carcinoma, ER+ / PR+ / Her2-, Grade 1  Intent: Curative     ==========DELIVERED PLANS==========  First Treatment Date: 2024-04-24 Last Treatment Date: 2024-05-22   Plan Name: Breast_L_BH Site: Breast, Left Technique: 3D Mode: Photon Dose Per Fraction: 2.67 Gy Prescribed Dose (Delivered / Prescribed): 40.05 Gy / 40.05 Gy Prescribed Fxs (Delivered / Prescribed): 15 / 15   Plan Name: Brst_L_Bst_BH Site: Breast, Left Technique: 3D Mode: Photon Dose Per Fraction: 2 Gy Prescribed Dose (Delivered / Prescribed): 12 Gy / 12 Gy Prescribed Fxs (Delivered / Prescribed): 6 / 6     ====================================   The patient tolerated radiation. She developed fatigue and anticipated skin changes in the treatment field. She used radiaplex as directed.   The patient will return in one month and will continue follow up with Dr. Lanny as well.      Ronita Due, PA-C

## 2024-05-24 ENCOUNTER — Ambulatory Visit (INDEPENDENT_AMBULATORY_CARE_PROVIDER_SITE_OTHER): Admitting: Surgical

## 2024-05-24 DIAGNOSIS — C50212 Malignant neoplasm of upper-inner quadrant of left female breast: Secondary | ICD-10-CM

## 2024-05-24 DIAGNOSIS — Z170 Estrogen receptor positive status [ER+]: Secondary | ICD-10-CM

## 2024-05-24 DIAGNOSIS — Z853 Personal history of malignant neoplasm of breast: Secondary | ICD-10-CM | POA: Diagnosis not present

## 2024-05-24 DIAGNOSIS — Z9889 Other specified postprocedural states: Secondary | ICD-10-CM

## 2024-05-24 DIAGNOSIS — Z08 Encounter for follow-up examination after completed treatment for malignant neoplasm: Secondary | ICD-10-CM

## 2024-05-24 NOTE — Progress Notes (Signed)
   Referring Provider [name], MD [phone] [address] Rebecca Dominguez,  [place] [address]   CC:  Chief Complaint  Patient presents with   Post-op Follow-up      Rebecca Dominguez is an 48 y.o. female.  HPI: Patient is a 48 year old female who underwent a left breast lumpectomy and left sentinel lymph node biopsy for left breast cancer with Dr. Curvin followed by bilateral oncoplastic breast reduction with Dr. Lowery on 01/31/2024.  Patient is just shy of 4 months postop.  She has completed her radiation therapy of her left breast and left axilla.  She reports that she had some difficulties with discomfort and fatigue, but reports it was better than she expected. She reports her right breast has been healing well without any issues.  She did have a previous stitch of the left lateral breast that was irritating her and was scheduled for excision in the office.  This has since resolved.  She reports she has been using Vaseline on her incisions from her lumpectomy and from her axillary dissection/lymph node biopsies. She is not having any infectious symptoms.    Review of Systems General: No fevers or chills  Physical Exam    05/03/2024   10:21 AM 04/19/2024   10:31 AM 04/17/2024   10:00 AM  Vitals with BMI  Height 5' 5 5' 5 5' 5  Weight 273 lbs 273 lbs 275 lbs  BMI 45.43 45.43 45.76  Systolic 110 102   Diastolic 70 67   Pulse 85 71     General:  No acute distress,  Alert and oriented, Non-Toxic, Normal speech and affect Right breast: Right breast incisions are intact, right NAC is viable.  There is no erythema or cellulitic change.  No subcutaneous fluid collection noted.  Left breast: Left breast was radiated skin changes noted, left axillary incision is noted, medial breast incision is noted from lumpectomy.  NAC is viable.  No subcutaneous fluid collection noted palpation.  There is no cellulitic changes.  She does have some thinning of the skin along the medial  lumpectomy incision, but no open wound is noted.  No active drainage noted today with palpation.  Along the superior axillary incision, she does have some irritation, but no active drainage.  No subcutaneous fluid collection noted.  No crepitus.  There is no obvious signs of infection.  Assessment/Plan  Recommend thin layer of Vaseline to breast and axillary incision.  Recommend continue to closely monitor these areas.  I do not appreciate any signs of infection or concern on exam.  Recommend continuing to follow-up with radiation in regards to recovery from radiation treatment.  We discussed scheduling a follow-up in about 8 weeks for reevaluation, but we discussed evaluation sooner if she has any changes or concerns.  Patient was agreeable with this plan.  Pictures taken and placed in her chart with her permission.  Rebecca Dominguez 05/24/2024, 9:15 AM

## 2024-05-29 ENCOUNTER — Ambulatory Visit: Payer: Self-pay | Attending: General Surgery

## 2024-05-29 VITALS — Wt 274.5 lb

## 2024-05-29 DIAGNOSIS — Z483 Aftercare following surgery for neoplasm: Secondary | ICD-10-CM | POA: Insufficient documentation

## 2024-05-29 NOTE — Therapy (Signed)
 OUTPATIENT PHYSICAL THERAPY [facility] SCREENING NOTE   Patient Name: Rebecca Dominguez MRN: [id number] DOB:05-Sep-1975, 48 y.o., female Today's Date: 05/29/2024  PCP: [name], MD REFERRING PROVIDER: Curvin Deward MOULD, MD   PT End of Session - 05/29/24 0831     Visit Number 2   # unchanged due to screen only   PT Start Time 0827    PT Stop Time 0834    PT Time Calculation (min) 7 min    Activity Tolerance Patient tolerated treatment well    Behavior During Therapy WFL for tasks assessed/performed          Past Medical History:  Diagnosis Date   Anemia    low iron   Back pain    Breast mass    Family history of brain cancer    Family history of breast cancer    Family history of prostate cancer    Foot pain    from previous fracture of toe 2010   Gallbladder problem    removed   Headache    occasional   Hypertension    no longer on medications since weight loss   Joint pain    Obesity    Plantar fasciitis    Vitamin B 12 deficiency    Vitamin D  deficiency    Past Surgical History:  Procedure Laterality Date   BREAST BIOPSY Left 11/25/2023   US  LT BREAST BX W LOC DEV 1ST LESION IMG BX SPEC US  GUIDE 11/25/2023 [facility]   BREAST BIOPSY  01/28/2024   MM LT RADIOACTIVE SEED LOC MAMMO GUIDE 01/28/2024 [facility]   BREAST LUMPECTOMY WITH RADIOACTIVE SEED AND SENTINEL LYMPH NODE BIOPSY Left 01/31/2024   Procedure: BREAST LUMPECTOMY WITH RADIOACTIVE SEED AND SENTINEL LYMPH NODE BIOPSY;  Surgeon: Curvin Deward MOULD, MD;  Location: [facility];  Service: General;  Laterality: Left;  GEN w/PEC BLOCK LEFT BREAST RADIOACTIVE SEED LOCALIZED LUMPECTOMY AND SENTINEL NODE BIOPSY   BREAST REDUCTION SURGERY Bilateral 01/31/2024   Procedure: BREAST REDUCTION;  Surgeon: Lowery Estefana RAMAN, DO;  Location: [facility];  Service: Plastics;  Laterality: Bilateral;   CHOLECYSTECTOMY  2013   COLONOSCOPY WITH PROPOFOL  N/A 03/06/2016   Procedure:  COLONOSCOPY WITH PROPOFOL ;  Surgeon: Elsie Cree, MD;  Location: [facility];  Service: Endoscopy;  Laterality: N/A;   gastric bypass surgery  2001   PILONIDAL CYST EXCISION     WISDOM TOOTH EXTRACTION     Patient Active Problem List   Diagnosis Date Noted   H/O gastric bypass 01/13/2024   Genetic testing 12/09/2023   Family history of breast cancer    Family history of brain cancer    Family history of prostate cancer    Malignant neoplasm of upper-inner quadrant of left female breast (HCC) 11/29/2023   Dysfunctional family processes 06/01/2023   Other constipation 03/31/2023   Bilateral leg edema 12/22/2022   Stress 12/22/2022   Low vitamin D  level 12/22/2022   Generalized edema 09/15/2022   BMI 40.0-44.9, adult (HCC) 09/15/2022   Obesity, Beginning BMI 56.25 09/15/2022   Low ferritin level 07/27/2022   Vitamin B 12 deficiency 07/27/2022   Vitamin D  deficiency 05/27/2022   Depression 04/29/2020   Insulin  resistance 10/12/2018   Class 3 severe obesity with serious comorbidity and body mass index (BMI) of 50.0 to 59.9 in adult [facility]) 10/12/2018    REFERRING DIAG: left breast cancer at risk for lymphedema  THERAPY DIAG: Aftercare following surgery for neoplasm  PERTINENT HISTORY: Post left  lumpectomy with mastopexy and Rt reduction 01/31/24. 3 negative nodes removed. Patient was diagnosed on 11/10/2023 with left grade 1 invasive ductal carcinoma breast cancer. It measures 4 mm and is located in the upper outer quadrant. It is ER/PR positive and HER2 negative with a Ki67 of 1%.  Radiation is undecided.   PRECAUTIONS: left UE Lymphedema risk, None  SUBJECTIVE: Pt returns for her first 3 month L-Dex screen.   PAIN:  Are you having pain? No  SOZO SCREENING: Patient was assessed today using the SOZO machine to determine the lymphedema index score. This was compared to her baseline score. It was determined that she is within the recommended range when compared to her baseline and  no further action is needed at this time. She will continue SOZO screenings. These are done every 3 months for 2 years post operatively followed by every 6 months for 2 years, and then annually.   L-DEX FLOWSHEETS - 05/29/24 0800       L-DEX LYMPHEDEMA SCREENING   Measurement Type Unilateral    L-DEX MEASUREMENT EXTREMITY Upper Extremity    POSITION  Standing    DOMINANT SIDE Right    At Risk Side Left    BASELINE SCORE (UNILATERAL) 16.4    L-DEX SCORE (UNILATERAL) 7    VALUE CHANGE (UNILAT) -9.4          P: Cont every 3 month L-Dex screens until 01/2026, and then can transition to 6 month screens until 01/2028.   Aden Berwyn Caldron, PTA 05/29/2024, 8:34 AM

## 2024-06-13 ENCOUNTER — Encounter: Payer: Self-pay | Admitting: Radiation Oncology

## 2024-06-17 NOTE — Progress Notes (Signed)
 Radiation Oncology         [phone] ________________________________  Name: Rebecca Dominguez MRN: [id number]  Date: 06/19/2024  DOB: 1976/07/01  Follow-Up Visit Note  CC: [name], MD  [name], MD    ICD-10-CM   1. Malignant neoplasm of upper-inner quadrant of left breast in female, estrogen receptor positive [facility])  C50.212    Z17.0       Diagnosis: The encounter diagnosis was Malignant neoplasm of upper-inner quadrant of left breast in female, estrogen receptor positive (HCC).   Stage IA (cT1a, cN0, cM0) Left Breast UIQ, Invasive Ductal Carcinoma, ER+ / PR+ / Her2-, Grade 1  Interval Since Last Radiation: approximately 1 month   Intent: Curative  Radiation Treatment Dates: First Treatment Date: 2024-04-24 -- Last Treatment Date: 2024-05-22 Site/Dose/Technique/Mode:  Plan Name: Breast_L_BH Site: Breast, Left Technique: 3D Mode: Photon Dose Per Fraction: 2.67 Gy Prescribed Dose (Delivered / Prescribed): 40.05 Gy / 40.05 Gy Prescribed Fxs (Delivered / Prescribed): 15 / 15   Plan Name: Brst_L_Bst_BH Site: Breast, Left Technique: 3D Mode: Photon Dose Per Fraction: 2 Gy Prescribed Dose (Delivered / Prescribed): 12 Gy / 12 Gy Prescribed Fxs (Delivered / Prescribed): 6 / 6  Narrative:  The patient returns today for routine follow-up. She tolerated radiation therapy to the left breast relatively well other than fatigue and anticipated skin changes in the treatment field. She used radiaplex for this as directed.   No other significant interval history since she completed radiation therapy. She is scheduled to follow up with medical oncology next week to discuss antiestrogen therapy.   Of note: She has continued to meet with plastics for routine follow-up visits s/p left breast lumpectomy and bilateral mammoplasties performed in July of 2025  Patient reports continued fatigue. She is experiencing a significant amount of stress from work and with her husband  in the process of applying for disability. She denies any breast pain, or issues with left breast ROM. She is pleased with how her skin has healed since completing her treatment. She notes some left arm tightness that was present prior to her surgery. SOZO screening from 05/29/2024 demonstrated no evidence of lymphedema.                                 Allergies:  has no known allergies.  Meds: Current Outpatient Medications  Medication Sig Dispense Refill   acetaminophen  (TYLENOL ) 500 MG tablet Take 500 mg by mouth every 6 (six) hours as needed.     aspirin-acetaminophen -caffeine (EXCEDRIN MIGRAINE) 250-250-65 MG tablet Take 1 tablet by mouth every 6 (six) hours as needed for headache.     cyanocobalamin  (VITAMIN B12) 500 MCG tablet Take 1 tablet (500 mcg total) by mouth daily. 30 tablet 2   escitalopram  (LEXAPRO ) 10 MG tablet Take 1 tablet (10 mg total) by mouth daily. 90 tablet 0   escitalopram  (LEXAPRO ) 20 MG tablet Take 1 tablet (20 mg total) by mouth daily. 90 tablet 0   ferrous sulfate 325 (65 FE) MG tablet Take 325 mg by mouth daily.     Glucosamine-Chondroit-Vit C-Mn (GLUCOSAMINE CHONDR 1500 COMPLX) CAPS Take 1 capsule by mouth daily.     Multiple Vitamins-Minerals (MULTIVITAMIN WOMEN) TABS Take 1 tablet by mouth daily.     naproxen (NAPROSYN) 500 MG tablet Take 500 mg by mouth 2 (two) times daily as needed.      tamoxifen  (NOLVADEX ) 20 MG tablet Take 1 tablet (  20 mg total) by mouth daily. 30 tablet 2   vitamin C (ASCORBIC ACID) 500 MG tablet Take 500 mg by mouth daily.     No current facility-administered medications for this encounter.    Physical Findings: The patient is in no acute distress. Patient is alert and oriented.  height is 5' 0.5 (1.537 m) and weight is 277 lb 12.8 oz (126 kg). Her temperature is 97.4 F (36.3 C) (abnormal). Her blood pressure is 163/84 (abnormal) and her pulse is 81. Her respiration is 18 and oxygen saturation is 99%. .  No significant changes.  Lungs are clear to auscultation bilaterally. Heart has regular rate and rhythm. No palpable cervical, supraclavicular, or axillary adenopathy. Abdomen soft, non-tender, normal bowel sounds.  Right Breast: no palpable mass, nipple discharge or bleeding. Left Breast: well healed surgical incisions. Satisfactory healing within the treatment field.   Lab Findings: Lab Results  Component Value Date   WBC 5.9 12/01/2023   HGB 13.8 12/01/2023   HCT 40.2 12/01/2023   MCV 90.7 12/01/2023   PLT 178 12/01/2023    Radiographic Findings: No results found.  Impression: Stage IA (cT1a, cN0, cM0) Left Breast UIQ, Invasive Ductal Carcinoma, ER+ / PR+ / Her2-, Grade 1  Rebecca Dominguez has healed well from the effects of her radiation treatment. She will continue on Tamoxifen  under the care of Dr. Lanny. She is scheduled to see Armida Silversmith on 06/27/2024.  Radiation follow-up PRN. We appreciate the opportunity to take part in this patient's care. She has been advised to call back with any issues or concerns.   20 minutes of total time was spent for this patient encounter, including preparation, face-to-face counseling with the patient and coordination of care, physical exam, and documentation of the encounter. ____________________________________    Leeroy Due, PA-C   This document serves as a record of services personally performed by Lynwood Nasuti, MD. It was created on his behalf by Rebecca Dominguez, a trained medical scribe. The creation of this record is based on the scribe's personal observations and the provider's statements to them. This document has been checked and approved by the attending provider.

## 2024-06-19 ENCOUNTER — Encounter (INDEPENDENT_AMBULATORY_CARE_PROVIDER_SITE_OTHER): Payer: Self-pay | Admitting: Family Medicine

## 2024-06-19 ENCOUNTER — Encounter: Payer: Self-pay | Admitting: Radiation Oncology

## 2024-06-19 ENCOUNTER — Ambulatory Visit (INDEPENDENT_AMBULATORY_CARE_PROVIDER_SITE_OTHER): Payer: Self-pay | Admitting: Family Medicine

## 2024-06-19 ENCOUNTER — Ambulatory Visit
Admission: RE | Admit: 2024-06-19 | Discharge: 2024-06-19 | Disposition: A | Source: Ambulatory Visit | Attending: Radiation Oncology | Admitting: Radiation Oncology

## 2024-06-19 VITALS — BP 115/69 | HR 76 | Temp 98.5°F | Ht 65.0 in | Wt 272.0 lb

## 2024-06-19 VITALS — BP 163/84 | HR 81 | Temp 97.4°F | Resp 18 | Ht 60.5 in | Wt 277.8 lb

## 2024-06-19 DIAGNOSIS — Z79899 Other long term (current) drug therapy: Secondary | ICD-10-CM | POA: Diagnosis not present

## 2024-06-19 DIAGNOSIS — Z923 Personal history of irradiation: Secondary | ICD-10-CM | POA: Insufficient documentation

## 2024-06-19 DIAGNOSIS — E559 Vitamin D deficiency, unspecified: Secondary | ICD-10-CM

## 2024-06-19 DIAGNOSIS — Z170 Estrogen receptor positive status [ER+]: Secondary | ICD-10-CM | POA: Diagnosis not present

## 2024-06-19 DIAGNOSIS — F5089 Other specified eating disorder: Secondary | ICD-10-CM

## 2024-06-19 DIAGNOSIS — Z79810 Long term (current) use of selective estrogen receptor modulators (SERMs): Secondary | ICD-10-CM | POA: Diagnosis not present

## 2024-06-19 DIAGNOSIS — Z1721 Progesterone receptor positive status: Secondary | ICD-10-CM | POA: Insufficient documentation

## 2024-06-19 DIAGNOSIS — E538 Deficiency of other specified B group vitamins: Secondary | ICD-10-CM | POA: Diagnosis not present

## 2024-06-19 DIAGNOSIS — Z1732 Human epidermal growth factor receptor 2 negative status: Secondary | ICD-10-CM | POA: Insufficient documentation

## 2024-06-19 DIAGNOSIS — R5383 Other fatigue: Secondary | ICD-10-CM | POA: Insufficient documentation

## 2024-06-19 DIAGNOSIS — C50212 Malignant neoplasm of upper-inner quadrant of left female breast: Secondary | ICD-10-CM | POA: Diagnosis present

## 2024-06-19 DIAGNOSIS — F329 Major depressive disorder, single episode, unspecified: Secondary | ICD-10-CM | POA: Diagnosis not present

## 2024-06-19 DIAGNOSIS — F3289 Other specified depressive episodes: Secondary | ICD-10-CM

## 2024-06-19 DIAGNOSIS — Z6841 Body Mass Index (BMI) 40.0 and over, adult: Secondary | ICD-10-CM

## 2024-06-19 DIAGNOSIS — Z853 Personal history of malignant neoplasm of breast: Secondary | ICD-10-CM

## 2024-06-19 DIAGNOSIS — E669 Obesity, unspecified: Secondary | ICD-10-CM

## 2024-06-19 MED ORDER — ESCITALOPRAM OXALATE 20 MG PO TABS: 20.0000 mg | ORAL_TABLET | Freq: Every day | ORAL | 0 refills | Status: AC

## 2024-06-19 MED ORDER — ESCITALOPRAM OXALATE 10 MG PO TABS: 10.0000 mg | ORAL_TABLET | Freq: Every day | ORAL | 0 refills | Status: AC

## 2024-06-19 NOTE — Progress Notes (Signed)
 Office: [phone]  /  Fax: [phone]  WEIGHT SUMMARY AND BIOMETRICS  Anthropometric Measurements Height: 5' 5 (1.651 m) Weight: 272 lb (123.4 kg) BMI (Calculated): 45.26 Weight at Last Visit: 274 lb Weight Lost Since Last Visit: 2 lb Weight Gained Since Last Visit: 0 Starting Weight: 338 lb Total Weight Loss (lbs): 66 lb (29.9 kg) Peak Weight: 358 lb   Body Composition  Body Fat %: 55.8 % Fat Mass (lbs): 152.2 lbs Muscle Mass (lbs): 114.4 lbs Visceral Fat Rating : 18   Other Clinical Data Fasting: yes Labs: no Today's Visit #: 14 Starting Date: 09/12/18    Chief Complaint: OBESITY     History of Present Illness Rebecca Dominguez is a 48 year old female with obesity who presents for obesity treatment and progress assessment.  She is adhering to the category three eating plan about fifty percent of the time, focusing on increasing protein intake, consuming more fruits and vegetables, hydrating adequately, and not skipping meals. She walks one mile for thirty minutes two to three days per week and has lost two pounds since her last visit approximately two and a half months ago. Financial struggles are impacting her ability to adhere to her meal plan due to rising grocery costs. She and her husband have resorted to setting up a GoFundMe to manage expenses. She is trying to be frugal, opting for more affordable carbohydrate-based meals, and is concerned about swelling in her legs and feet, as well as tightness in her clothing. She is making efforts to stay hydrated and increase protein intake, including drinking protein shakes and consuming antioxidant-rich foods like blueberries.  In addition to obesity, she is being treated for emotional eating behaviors. She is currently taking Lexapro  30 mg daily, consisting of a 20 mg and a 10 mg tablet, and reports stability on this medication. She feels exhausted, tired, and sleepy, which she attributes to the Monday after  Thanksgiving.  She is recovering from cancer treatment, having undergone radiation therapy. She experienced redness, itchiness, and irritation around the incision site where lymph nodes were removed, with the skin peeling off and becoming raw. She used antibiotic ointment and gauze to manage the irritation, especially due to bra friction. The area appeared rash-like and speckled, becoming more red and inflamed after daily treatments. She completed 21 radiation treatments, with the last six being boost treatments focused on the lumpectomy site. The discoloration has resolved, and the area is now healed. She expresses concerns about starting tamoxifen  due to potential side effects, as she is already experiencing symptoms of perimenopause.      PHYSICAL EXAM:  Blood pressure 115/69, pulse 76, temperature 98.5 F (36.9 C), height 5' 5 (1.651 m), weight 272 lb (123.4 kg), SpO2 99%. Body mass index is 45.26 kg/m.  DIAGNOSTIC DATA REVIEWED:  BMET    Component Value Date/Time   NA 140 12/01/2023 1221   NA 141 03/31/2023 0829   K 4.7 12/01/2023 1221   CL 109 12/01/2023 1221   CO2 28 12/01/2023 1221   GLUCOSE 86 12/01/2023 1221   BUN 23 (H) 12/01/2023 1221   BUN 15 03/31/2023 0829   CREATININE 0.61 12/01/2023 1221   CALCIUM 9.5 12/01/2023 1221   GFRNONAA >60 12/01/2023 1221   GFRAA 115 12/27/2019 1150   Lab Results  Component Value Date   HGBA1C 5.3 03/31/2023   HGBA1C 5.3 09/12/2018   Lab Results  Component Value Date   INSULIN  5.5 03/31/2023   INSULIN  8.8 09/12/2018  Lab Results  Component Value Date   TSH 2.210 11/19/2022   CBC    Component Value Date/Time   WBC 5.9 12/01/2023 1221   RBC 4.43 12/01/2023 1221   HGB 13.8 12/01/2023 1221   HGB 13.5 11/19/2022 0842   HCT 40.2 12/01/2023 1221   HCT 42.7 11/19/2022 0842   PLT 178 12/01/2023 1221   PLT 204 11/19/2022 0842   MCV 90.7 12/01/2023 1221   MCV 94 11/19/2022 0842   MCH 31.2 12/01/2023 1221   MCHC 34.3  12/01/2023 1221   RDW 12.9 12/01/2023 1221   RDW 13.3 11/19/2022 0842   Iron Studies    Component Value Date/Time   IRON 54 06/05/2020 0000   TIBC 441 06/05/2020 0000   FERRITIN 17 09/12/2018 0957   IRONPCTSAT 12 06/05/2020 0000   Lipid Panel     Component Value Date/Time   CHOL 155 11/19/2022 0842   TRIG 36 11/19/2022 0842   HDL 73 11/19/2022 0842   CHOLHDL 2.3 01/28/2021 1115   LDLCALC 73 11/19/2022 0842   Hepatic Function Panel     Component Value Date/Time   PROT 6.6 12/01/2023 1221   PROT 6.0 03/31/2023 0829   ALBUMIN 4.0 12/01/2023 1221   ALBUMIN 3.9 03/31/2023 0829   AST 25 12/01/2023 1221   ALT 28 12/01/2023 1221   ALKPHOS 58 12/01/2023 1221   BILITOT 0.4 12/01/2023 1221      Component Value Date/Time   TSH 2.210 11/19/2022 0842   Nutritional Lab Results  Component Value Date   VD25OH 53.4 03/31/2023   VD25OH 36.3 11/19/2022   VD25OH 41.1 05/27/2022     Assessment and Plan Assessment & Plan Obesity and emotional eating behaviors Obesity management with a category three eating plan, followed approximately 50% of the time. Engaging in physical activity by walking one mile, 30 minutes, 2-3 days per week. Weight loss of 2 pounds since last visit. Emotional eating behaviors managed with escitalopram  30 mg daily. Financial constraints impacting dietary choices, with increased carbohydrate intake due to cost. Incorporating antioxidant-rich foods like blueberries and exploring plant-based protein sources such as beans and kale. Considering frozen blueberries for cost-effectiveness and convenience. Exploring plant-based protein snacks like Only Beans and [employer] edamame. - Continue category three eating plan. - Encouraged increased protein intake from plant sources. - Encouraged incorporation of antioxidant-rich foods. - Encouraged consideration of frozen blueberries for cost-effectiveness. - Encouraged exploration of plant-based protein snacks like Only  Beans and [employer] edamame. - Continue physical activity regimen. - Refilled escitalopram  30 mg daily.  Major depressive disorder, stable on escitalopram  Major depressive disorder well-managed on escitalopram  30 mg daily. Reports feeling exhausted and stressed due to financial difficulties and concerns about pets. Considering an extra dose occasionally due to stress but advised against increasing dose due to potential side effects without additional benefits. - Continue escitalopram  30 mg daily. - Refilled escitalopram  prescription.  History of breast cancer, status post lumpectomy and radiation Status post lumpectomy and radiation therapy for breast cancer. Experienced skin irritation and rash during radiation, now resolved. Concerns about starting tamoxifen  due to potential side effects, especially given perimenopausal symptoms. Scheduled for follow-up with radiation oncologist and survivorship appointment with medical oncologist to discuss tamoxifen  and its side effects. - Attend follow-up appointment with radiation oncologist. - Attend survivorship appointment with medical oncologist to discuss tamoxifen . - Continue weight loss efforts and healthy diet to decrease chances of recurrence.   Vitamin D  and B group vitamin deficiencies Previous lab results indicated  vitamin D  and B group vitamin deficiencies. Labs are due for re-evaluation. - Ordered lab tests for vitamin D  and B12 levels.       Patients who are on anti-obesity medications are counseled on the importance of maintaining healthy lifestyle habits, including balanced nutrition, regular physical activity, and behavioral modifications,  Medication is an adjunct to, not a replacement for, lifestyle changes and that the long-term success and weight maintenance depend on continued adherence to these strategies.   Rebecca Dominguez was informed of the importance of frequent follow up visits to maximize her success with intensive  lifestyle modifications for her obesity and obesity related health conditions as recommended by USPSTF and CMS guidelines  Louann Penton, MD

## 2024-06-19 NOTE — Progress Notes (Signed)
 Rebecca Dominguez is here today for follow up post radiation to the breast.   Breast Side: Left    They completed their radiation on: 05/22/24   Does the patient complain of any of the following: Post radiation skin issues:  No Breast Tenderness: Sensitive to nipple Breast Swelling:  no Lymphadema:  Reports tightness to left arm. SOZO was done in November.  Range of Motion limitations: No  Fatigue post radiation: Yes Appetite good/fair/poor: Good   Additional comments if applicable:    Patient has not started tamoxifen  yet, Patient to see med onc on 06/27/24.    BP (!) 163/84 (BP Location: Right Arm, Patient Position: Sitting, Cuff Size: Large)   Pulse 81   Temp (!) 97.4 F (36.3 C)   Resp 18   Ht 5' 0.5 (1.537 m)   Wt 277 lb 12.8 oz (126 kg)   SpO2 99%   BMI 53.36 kg/m

## 2024-06-20 LAB — CMP14+EGFR
ALT: 35 IU/L — ABNORMAL HIGH (ref 0–32)
AST: 33 IU/L (ref 0–40)
Albumin: 4.1 g/dL (ref 3.9–4.9)
Alkaline Phosphatase: 78 IU/L (ref 41–116)
BUN/Creatinine Ratio: 33 — ABNORMAL HIGH (ref 9–23)
BUN: 17 mg/dL (ref 6–24)
Bilirubin Total: 0.3 mg/dL (ref 0.0–1.2)
CO2: 21 mmol/L (ref 20–29)
Calcium: 9.6 mg/dL (ref 8.7–10.2)
Chloride: 104 mmol/L (ref 96–106)
Creatinine, Ser: 0.51 mg/dL — ABNORMAL LOW (ref 0.57–1.00)
Globulin, Total: 2.3 g/dL (ref 1.5–4.5)
Glucose: 81 mg/dL (ref 70–99)
Potassium: 4.2 mmol/L (ref 3.5–5.2)
Sodium: 141 mmol/L (ref 134–144)
Total Protein: 6.4 g/dL (ref 6.0–8.5)
eGFR: 115 mL/min/1.73 (ref >59)

## 2024-06-20 LAB — CBC WITH DIFFERENTIAL/PLATELET
Basophils Absolute: 0 x10E3/uL (ref 0.0–0.2)
Basos: 1 %
EOS (ABSOLUTE): 0.1 x10E3/uL (ref 0.0–0.4)
Eos: 3 %
Hematocrit: 43.3 % (ref 34.0–46.6)
Hemoglobin: 13.9 g/dL (ref 11.1–15.9)
Immature Grans (Abs): 0 x10E3/uL (ref 0.0–0.1)
Immature Granulocytes: 0 %
Lymphocytes Absolute: 1 x10E3/uL (ref 0.7–3.1)
Lymphs: 28 %
MCH: 30.8 pg (ref 26.6–33.0)
MCHC: 32.1 g/dL (ref 31.5–35.7)
MCV: 96 fL (ref 79–97)
Monocytes Absolute: 0.3 x10E3/uL (ref 0.1–0.9)
Monocytes: 9 %
Neutrophils Absolute: 2.1 x10E3/uL (ref 1.4–7.0)
Neutrophils: 59 %
Platelets: 168 x10E3/uL (ref 150–450)
RBC: 4.51 x10E6/uL (ref 3.77–5.28)
RDW: 12.5 % (ref 11.7–15.4)
WBC: 3.5 x10E3/uL (ref 3.4–10.8)

## 2024-06-20 LAB — LIPID PANEL WITH LDL/HDL RATIO
Cholesterol, Total: 167 mg/dL (ref 100–199)
HDL: 76 mg/dL (ref >39)
LDL Chol Calc (NIH): 82 mg/dL (ref 0–99)
LDL/HDL Ratio: 1.1 ratio (ref 0.0–3.2)
Triglycerides: 41 mg/dL (ref 0–149)
VLDL Cholesterol Cal: 9 mg/dL (ref 5–40)

## 2024-06-20 LAB — INSULIN, RANDOM: INSULIN: 7.7 u[IU]/mL (ref 2.6–24.9)

## 2024-06-20 LAB — VITAMIN B12: Vitamin B-12: >2000 pg/mL — ABNORMAL HIGH (ref 232–1245)

## 2024-06-20 LAB — TSH: TSH: 3.53 u[IU]/mL (ref 0.450–4.500)

## 2024-06-20 LAB — VITAMIN D 25 HYDROXY (VIT D DEFICIENCY, FRACTURES): Vit D, 25-Hydroxy: 36.6 ng/mL (ref 30.0–100.0)

## 2024-06-20 LAB — HEMOGLOBIN A1C
Est. average glucose Bld gHb Est-mCnc: 100 mg/dL
Hgb A1c MFr Bld: 5.1 % (ref 4.8–5.6)

## 2024-06-26 ENCOUNTER — Other Ambulatory Visit: Payer: Self-pay | Admitting: Hematology

## 2024-06-27 ENCOUNTER — Inpatient Hospital Stay: Attending: Nurse Practitioner | Admitting: Nurse Practitioner

## 2024-06-27 ENCOUNTER — Ambulatory Visit: Admitting: Plastic Surgery

## 2024-06-27 VITALS — BP 120/70 | HR 81 | Temp 97.8°F | Resp 17 | Wt 278.8 lb

## 2024-06-27 DIAGNOSIS — Z79810 Long term (current) use of selective estrogen receptor modulators (SERMs): Secondary | ICD-10-CM | POA: Diagnosis not present

## 2024-06-27 DIAGNOSIS — C50212 Malignant neoplasm of upper-inner quadrant of left female breast: Secondary | ICD-10-CM | POA: Insufficient documentation

## 2024-06-27 DIAGNOSIS — Z808 Family history of malignant neoplasm of other organs or systems: Secondary | ICD-10-CM | POA: Diagnosis not present

## 2024-06-27 DIAGNOSIS — Z1721 Progesterone receptor positive status: Secondary | ICD-10-CM | POA: Insufficient documentation

## 2024-06-27 DIAGNOSIS — Z9049 Acquired absence of other specified parts of digestive tract: Secondary | ICD-10-CM | POA: Insufficient documentation

## 2024-06-27 DIAGNOSIS — Z79899 Other long term (current) drug therapy: Secondary | ICD-10-CM | POA: Insufficient documentation

## 2024-06-27 DIAGNOSIS — Z803 Family history of malignant neoplasm of breast: Secondary | ICD-10-CM | POA: Insufficient documentation

## 2024-06-27 DIAGNOSIS — Z170 Estrogen receptor positive status [ER+]: Secondary | ICD-10-CM | POA: Insufficient documentation

## 2024-06-27 DIAGNOSIS — I10 Essential (primary) hypertension: Secondary | ICD-10-CM | POA: Diagnosis not present

## 2024-06-27 DIAGNOSIS — Z17411 Hormone receptor positive with human epidermal growth factor receptor 2 negative status: Secondary | ICD-10-CM | POA: Diagnosis not present

## 2024-06-27 DIAGNOSIS — Z9884 Bariatric surgery status: Secondary | ICD-10-CM | POA: Diagnosis not present

## 2024-06-27 DIAGNOSIS — Z923 Personal history of irradiation: Secondary | ICD-10-CM | POA: Insufficient documentation

## 2024-06-27 DIAGNOSIS — Z800 Family history of malignant neoplasm of digestive organs: Secondary | ICD-10-CM | POA: Diagnosis not present

## 2024-06-27 DIAGNOSIS — Z1732 Human epidermal growth factor receptor 2 negative status: Secondary | ICD-10-CM | POA: Insufficient documentation

## 2024-06-27 DIAGNOSIS — Z807 Family history of other malignant neoplasms of lymphoid, hematopoietic and related tissues: Secondary | ICD-10-CM | POA: Insufficient documentation

## 2024-06-27 DIAGNOSIS — Z801 Family history of malignant neoplasm of trachea, bronchus and lung: Secondary | ICD-10-CM | POA: Diagnosis not present

## 2024-06-27 NOTE — Progress Notes (Signed)
 "  CLINIC:  Survivorship   Patient Care Team: [name], MD as PCP - General (Family Medicine) Lanny Callander, MD as Consulting Physician (Hematology) Tyree Nanetta SAILOR, RN as Oncology Nurse Navigator Curvin Deward MOULD, MD as Consulting Physician (General Surgery) Shannon Agent, MD as Consulting Physician (Radiation Oncology)  Date of service: 06/27/2024  REASON FOR VISIT:  Routine follow-up post-treatment for a recent history of breast cancer.  BRIEF ONCOLOGIC HISTORY:  Oncology History  Malignant neoplasm of upper-inner quadrant of left female breast (HCC)  11/25/2023 Cancer Staging   Staging form: Breast, AJCC 8th Edition - Clinical stage from 11/25/2023: Stage IA (cT1a, cN0, cM0, G1, ER+, PR+, HER2-) - Signed by Lanny Callander, MD on 12/01/2023 Stage prefix: Initial diagnosis Histologic grading system: 3 grade system   11/29/2023 Initial Diagnosis   Malignant neoplasm of upper-inner quadrant of left female breast (HCC)   12/07/2023 Genetic Testing   Negative genetic testing on the BRCAPlus panel and CancerNext-Expanded+RNAinsight panel  LZTR1 VUS identified.  The report date is May 20, and Dec 16, 2023.    The CancerNext-Expanded gene panel offered by [facility] and includes sequencing, rearrangement, and RNA analysis for the following 77 genes: AIP, ALK, APC, ATM, BAP1, BARD1, BMPR1A, BRCA1, BRCA2, BRIP1, CDC73, CDH1, CDK4, CDKN1B, CDKN2A, CEBPA, CHEK2, CTNNA1, DDX41, DICER1, ETV6, FH, FLCN, GATA2, LZTR1, MAX, MBD4, MEN1, MET, MLH1, MSH2, MSH3, MSH6, MUTYH, NF1, NF2, NTHL1, PALB2, PHOX2B, PMS2, POT1, PRKAR1A, PTCH1, PTEN, RAD51C, RAD51D, RB1, RET, RPS20, RUNX1, SDHA, SDHAF2, SDHB, SDHC, SDHD, SMAD4, SMARCA4, SMARCB1, SMARCE1, STK11, SUFU, TMEM127, TP53, TSC1, TSC2, VHL, and WT1 (sequencing and deletion/duplication); AXIN2, CTNNA1, DDX41, EGFR, HOXB13, KIT, MBD4, MITF, MSH3, PDGFRA, POLD1 and POLE (sequencing only); EPCAM and GREM1 (deletion/duplication only). RNA data is routinely analyzed for  use in variant interpretation for all genes. The BRCAPlus gene panel offered by [facility] and includes sequencing and rearrangement analysis for the following 13 genes: ATM, BARD1, BRCA1, BRCA2, CDH1, CHEK2, NF1, PALB2, PTEN, RAD51C, RAD51D, STK11 and TP53.   01/31/2024 Cancer Staging   Staging form: Breast, AJCC 8th Edition - Pathologic stage from 01/31/2024: Stage IA (pT1b, pN0, cM0, G1, ER+, PR+, HER2-) - Signed by Lanny Callander, MD on 02/27/2024 Stage prefix: Initial diagnosis Histologic grading system: 3 grade system Residual tumor (R): R0     INTERVAL HISTORY:  Rebecca Dominguez presents to the Survivorship Clinic today for our initial meeting to review her survivorship care plan detailing her treatment course for breast cancer, as well as monitoring long-term side effects of that treatment, education regarding health maintenance, screening, and overall wellness and health promotion.     Overall, Rebecca Dominguez is doing OK. She has healed from radiation with some discomfort at lumpectomy site. She is fatigued but attributes some of this to work and stress. Has difficulty staying asleep.    REVIEW OF SYSTEMS:  Review of Systems - Oncology Breast: Denies any new nodularity, masses, tenderness, nipple changes, or nipple discharge.      ONCOLOGY TREATMENT TEAM:  1. Surgeon:  Dr. Curvin at [facility] 2. Medical Oncologist: Dr. Lanny  3. Radiation Oncologist: Dr. Shannon    PAST MEDICAL/SURGICAL HISTORY:  Past Medical History:  Diagnosis Date   Anemia    low iron   Back pain    Breast mass    Family history of brain cancer    Family history of breast cancer    Family history of prostate cancer    Foot pain    from previous fracture  of toe 2010   Gallbladder problem    removed   Headache    occasional   History of radiation therapy    Left breast-04/24/24-05/22/24- Dr. Lynwood Nasuti   Hypertension    no longer on medications since weight loss   Joint pain    Obesity     Plantar fasciitis    Vitamin B 12 deficiency    Vitamin D  deficiency    Past Surgical History:  Procedure Laterality Date   BREAST BIOPSY Left 11/25/2023   US  LT BREAST BX W LOC DEV 1ST LESION IMG BX SPEC US  GUIDE 11/25/2023 [facility]   BREAST BIOPSY  01/28/2024   MM LT RADIOACTIVE SEED LOC MAMMO GUIDE 01/28/2024 [facility]AMMO[facility]   BREAST LUMPECTOMY WITH RADIOACTIVE SEED AND SENTINEL LYMPH NODE BIOPSY Left 01/31/2024   Procedure: BREAST LUMPECTOMY WITH RADIOACTIVE SEED AND SENTINEL LYMPH NODE BIOPSY;  Surgeon: Curvin Deward MOULD, MD;  Location: [facility];  Service: General;  Laterality: Left;  GEN w/PEC BLOCK LEFT BREAST RADIOACTIVE SEED LOCALIZED LUMPECTOMY AND SENTINEL NODE BIOPSY   BREAST REDUCTION SURGERY Bilateral 01/31/2024   Procedure: BREAST REDUCTION;  Surgeon: Lowery Estefana RAMAN, DO;  Location: [facility];  Service: Plastics;  Laterality: Bilateral;   CHOLECYSTECTOMY  2013   COLONOSCOPY WITH PROPOFOL  N/A 03/06/2016   Procedure: COLONOSCOPY WITH PROPOFOL ;  Surgeon: Elsie Cree, MD;  Location: [facility];  Service: Endoscopy;  Laterality: N/A;   gastric bypass surgery  2001   PILONIDAL CYST EXCISION     WISDOM TOOTH EXTRACTION       ALLERGIES:  No Known Allergies   CURRENT MEDICATIONS:  Outpatient Encounter Medications as of 06/27/2024  Medication Sig   acetaminophen  (TYLENOL ) 500 MG tablet Take 500 mg by mouth every 6 (six) hours as needed.   aspirin-acetaminophen -caffeine (EXCEDRIN MIGRAINE) 250-250-65 MG tablet Take 1 tablet by mouth every 6 (six) hours as needed for headache.   cyanocobalamin  (VITAMIN B12) 500 MCG tablet Take 1 tablet (500 mcg total) by mouth daily.   escitalopram  (LEXAPRO ) 10 MG tablet Take 1 tablet (10 mg total) by mouth daily.   escitalopram  (LEXAPRO ) 20 MG tablet Take 1 tablet (20 mg total) by mouth daily.   ferrous sulfate 325 (65 FE) MG tablet Take 325 mg by mouth daily.   Glucosamine-Chondroit-Vit C-Mn  (GLUCOSAMINE CHONDR 1500 COMPLX) CAPS Take 1 capsule by mouth daily.   Multiple Vitamins-Minerals (MULTIVITAMIN WOMEN) TABS Take 1 tablet by mouth daily.   naproxen (NAPROSYN) 500 MG tablet Take 500 mg by mouth 2 (two) times daily as needed.    tamoxifen  (NOLVADEX ) 20 MG tablet TAKE 1 TABLET(20 MG) BY MOUTH DAILY   vitamin C (ASCORBIC ACID) 500 MG tablet Take 500 mg by mouth daily.   No facility-administered encounter medications on file as of 06/27/2024.     ONCOLOGIC FAMILY HISTORY:  Family History  Problem Relation Age of Onset   Cancer Mother 11       breast   Obesity Mother    Breast cancer Mother 49   Cancer Father 96       colon caner   Colon cancer Father 72   Diabetes Father    Obesity Father    Cancer Paternal Uncle        thorat cancer   Throat cancer Paternal Uncle    Cancer Maternal Grandmother 45       lung cancer   Lung cancer Maternal Grandmother 45   Cancer Maternal Grandfather  prostate with mets to  bone and brain   Prostate cancer Maternal Grandfather        dx. late 38s   Cancer Paternal Grandmother        female' cancer in her 44s-40s   Cancer Cousin 20       brain cancer   Brain cancer Cousin 20       pat first cousin   Cancer Other        lymphoma- great grandmother     GENETIC COUNSELING/TESTING: Yes, negative    SOCIAL HISTORY:  Rebecca Dominguez is married. Under social stress.    She denies any current or history of tobacco, alcohol, or illicit drug use.     PHYSICAL EXAMINATION:  Vital Signs:   Vitals:   06/27/24 1146  BP: 120/70  Pulse: 81  Resp: 17  Temp: 97.8 F (36.6 C)  SpO2: 97%   Filed Weights   06/27/24 1146  Weight: 278 lb 12.8 oz (126.5 kg)   General: Well-nourished, well-appearing female in no acute distress.  HEENT:  Sclerae anicteric.  Respiratory: breathing non-labored.  Neuro: No focal deficits. Steady gait.  Psych: Mood and affect normal and appropriate for situation.  Extremities: No  edema. MSK: No focal spinal tenderness to palpation.  Full range of motion in bilateral upper extremities Skin: Warm and dry.   LABORATORY DATA:  None for this visit.  DIAGNOSTIC IMAGING:  None for this visit.      ASSESSMENT AND PLAN:  Ms.. Dominguez is a pleasant 48 y.o. female with Stage I left breast invasive ductal carcinoma, ER+/PR+/HER2-, diagnosed in 11/2023, treated with lumpectomy, adjuvant radiation therapy, and anti-estrogen therapy with Tamoxifen  beginning in 04/2024.  She presents to the Survivorship Clinic for our initial meeting and routine follow-up post-completion of treatment for breast cancer.    #. Stage I left breast cancer:  Rebecca Dominguez is continuing to recover from definitive treatment for breast cancer. She will follow-up with her medical oncologist, Dr. Lanny in 09/2024 with history and physical exam per surveillance protocol.  Despite some intolerances/concerns she agrees to continue tamoxifen  for now. We discussed when she becomes postmenopausal can switch to AI to see if she tolerates that better. She was instructed to make Dr. Lanny or myself aware if she begins to experience any worsening side effects of the medication and I could see her back in clinic to help manage those side effects, as needed. Though the incidence is low, there is an associated risk of endometrial cancer with anti-estrogen therapies like Tamoxifen .  Rebecca Dominguez was encouraged to contact Dr. Lanny or myself with any vaginal bleeding while taking Tamoxifen . Other side effects of Tamoxifen  were again reviewed with her as well. Today, a comprehensive survivorship care plan and treatment summary was reviewed with the patient today detailing her breast cancer diagnosis, treatment course, potential late/long-term effects of treatment, appropriate follow-up care with recommendations for the future, and patient education resources.  A copy of this summary, along with a letter will be sent to the patients primary  care provider via mail/fax/In Basket message after todays visit.    #. Bone health:  Given Rebecca Dominguez's age/history of breast cancer, premenopausal status and tamoxifen , she does not need DEXA screening yet.  In the meantime, she was encouraged to increase her consumption of foods rich in calcium, as well as increase her weight-bearing activities.  She was given education on specific activities to promote bone health.  #. Cancer screening:  Due to  Rebecca Dominguez history and her age, she should receive screening for skin cancers, colon cancer, and gynecologic cancers.  The information and recommendations are listed on the patient's comprehensive care plan/treatment summary and were reviewed in detail with the patient.    #. Health maintenance and wellness promotion: Rebecca Dominguez was encouraged to consume 5-7 servings of fruits and vegetables per day.  She was also encouraged to engage in moderate to vigorous exercise for 30 minutes per day most days of the week.  She was instructed to limit her alcohol consumption and continue to abstain from tobacco use.     #. Support services/counseling: It is not uncommon for this period of the patient's cancer care trajectory to be one of many emotions and stressors.  We discussed an opportunity for her to participate in the next session of FYNN (Finding Your New Normal) support group series designed for patients after they have completed treatment.   Rebecca Dominguez was encouraged to take advantage of our many other support services programs, support groups, and/or counseling in coping with her new life as a cancer survivor after completing anti-cancer treatment.  She was offered support today through active listening and expressive supportive counseling.  She was given information regarding our available services and encouraged to contact me with any questions or for help enrolling in any of our support group/programs.    Dispo:   -Return to cancer center 09/2024 as  scheduled  -Mammogram due in 11/2024 -Follow up with surgery as scheduled -Referral to social work for social stressors  She is welcome to return back to the Survivorship Clinic at any time; no additional follow-up needed at this time. Consider referral back to survivorship as a long-term survivor for continued surveillance  A total of (30) minutes of face-to-face time was spent with this patient with greater than 50% of that time in counseling and care-coordination.   Rebecca Peyser, NP Survivorship Program [facility] [phone]   Note: PRIMARY CARE PROVIDER [name], [place] [phone]   "

## 2024-07-07 ENCOUNTER — Ambulatory Visit: Admitting: Student

## 2024-07-12 ENCOUNTER — Encounter: Payer: Self-pay | Admitting: Nurse Practitioner

## 2024-07-17 ENCOUNTER — Inpatient Hospital Stay: Admitting: Licensed Clinical Social Worker

## 2024-07-17 DIAGNOSIS — C50212 Malignant neoplasm of upper-inner quadrant of left female breast: Secondary | ICD-10-CM

## 2024-07-17 NOTE — Progress Notes (Signed)
 [facility] CSW Progress Note  Clinical Child Psychotherapist contacted patient by phone to follow-up on emotional support.    Interventions: CSW received a referral to contact pt regarding supportive services post survivorship visit.  Pt verbalized interest in any supportive services offered post treatment.  CSW spoke w/ pt regarding FYNN, breast cancer support group, as well as individual counseling.  Pt requesting an email explaining supportive services as well as any financial assistance which may be available.  Pt's spouse has filed for SSDI which is still pending and they are struggling financially.  CSW to send email w/ supportive services as well as any financial resources that may be available specifically for spouse's medical issues.        Follow Up Plan:  Patient will contact CSW with any support or resource needs    Devere JONELLE Manna, LCSW Clinical Social Worker [facility]

## 2024-07-18 ENCOUNTER — Ambulatory Visit: Admitting: Student

## 2024-08-15 ENCOUNTER — Telehealth: Payer: Self-pay | Admitting: *Deleted

## 2024-08-15 NOTE — Telephone Encounter (Signed)
 [employer] 2021-05 - Specimen Collection Study to Evaluate Biomarkers in Subjects with Cancer    Spoke with pt this morning to clarify family history of throat cancer for above clinical study. Questioned pt if the throat cancer was more specific to the esophagus or could it have been SSCHN? Pt stated she does not have that specific answer and as far as she knows the relative had throat cancer. Pt was Thanked for her time and consideration.   Rebecca Larsen, RN, BSN Clinical Research Nurse [phone] 08/15/2024

## 2024-08-29 ENCOUNTER — Ambulatory Visit (INDEPENDENT_AMBULATORY_CARE_PROVIDER_SITE_OTHER): Admitting: Family Medicine

## 2024-09-11 ENCOUNTER — Ambulatory Visit: Attending: General Surgery

## 2024-09-25 ENCOUNTER — Inpatient Hospital Stay: Admitting: Hematology

## 2024-09-25 ENCOUNTER — Inpatient Hospital Stay: Attending: Nurse Practitioner
